# Patient Record
Sex: Male | Born: 1960 | Race: Black or African American | Hispanic: No | Marital: Single | Smoking: Former smoker
Health system: Southern US, Community
[De-identification: ages and names within clinical notes are randomized; demographics above are authoritative.]

## PROBLEM LIST (undated history)

## (undated) ENCOUNTER — Emergency Department: Payer: Self-pay

## (undated) DIAGNOSIS — I10 Essential (primary) hypertension: Secondary | ICD-10-CM

## (undated) DIAGNOSIS — F209 Schizophrenia, unspecified: Secondary | ICD-10-CM

## (undated) DIAGNOSIS — G4700 Insomnia, unspecified: Secondary | ICD-10-CM

## (undated) DIAGNOSIS — F419 Anxiety disorder, unspecified: Secondary | ICD-10-CM

## (undated) DIAGNOSIS — F319 Bipolar disorder, unspecified: Secondary | ICD-10-CM

## (undated) DIAGNOSIS — E785 Hyperlipidemia, unspecified: Secondary | ICD-10-CM

## (undated) DIAGNOSIS — G459 Transient cerebral ischemic attack, unspecified: Secondary | ICD-10-CM

## (undated) HISTORY — DX: Insomnia, unspecified: G47.00

## (undated) HISTORY — DX: Essential (primary) hypertension: I10

## (undated) HISTORY — DX: Anxiety disorder, unspecified: F41.9

## (undated) HISTORY — DX: Transient cerebral ischemic attack, unspecified: G45.9

## (undated) HISTORY — DX: Hyperlipidemia, unspecified: E78.5

## (undated) HISTORY — PX: NO PAST SURGERIES: SHX2092

---

## 2000-11-09 ENCOUNTER — Emergency Department (HOSPITAL_COMMUNITY): Admission: EM | Admit: 2000-11-09 | Discharge: 2000-11-09 | Payer: Self-pay | Admitting: *Deleted

## 2001-12-10 ENCOUNTER — Emergency Department (HOSPITAL_COMMUNITY): Admission: EM | Admit: 2001-12-10 | Discharge: 2001-12-10 | Payer: Self-pay | Admitting: Emergency Medicine

## 2002-03-13 ENCOUNTER — Emergency Department (HOSPITAL_COMMUNITY): Admission: EM | Admit: 2002-03-13 | Discharge: 2002-03-13 | Payer: Self-pay | Admitting: Emergency Medicine

## 2006-07-15 ENCOUNTER — Emergency Department (HOSPITAL_COMMUNITY): Admission: EM | Admit: 2006-07-15 | Discharge: 2006-07-15 | Payer: Self-pay | Admitting: Emergency Medicine

## 2006-07-22 ENCOUNTER — Ambulatory Visit: Payer: Self-pay | Admitting: Nurse Practitioner

## 2006-07-30 ENCOUNTER — Ambulatory Visit: Payer: Self-pay | Admitting: Nurse Practitioner

## 2006-10-25 ENCOUNTER — Emergency Department (HOSPITAL_COMMUNITY): Admission: EM | Admit: 2006-10-25 | Discharge: 2006-10-26 | Payer: Self-pay | Admitting: Emergency Medicine

## 2008-01-22 ENCOUNTER — Emergency Department (HOSPITAL_COMMUNITY): Admission: EM | Admit: 2008-01-22 | Discharge: 2008-01-22 | Payer: Self-pay | Admitting: Emergency Medicine

## 2014-02-14 ENCOUNTER — Emergency Department (HOSPITAL_COMMUNITY)
Admission: EM | Admit: 2014-02-14 | Discharge: 2014-02-14 | Disposition: A | Discharge status: Home / Self Care | Payer: Medicare Other | Attending: Emergency Medicine | Admitting: Emergency Medicine

## 2014-02-14 ENCOUNTER — Encounter (HOSPITAL_COMMUNITY): Payer: Self-pay | Admitting: Emergency Medicine

## 2014-02-14 DIAGNOSIS — F209 Schizophrenia, unspecified: Secondary | ICD-10-CM | POA: Insufficient documentation

## 2014-02-14 HISTORY — DX: Schizophrenia, unspecified: F20.9

## 2014-02-14 LAB — CBC
HCT: 36.3 % — ABNORMAL LOW (ref 39.0–52.0)
Hemoglobin: 11.7 g/dL — ABNORMAL LOW (ref 13.0–17.0)
MCH: 27 pg (ref 26.0–34.0)
MCHC: 32.2 g/dL (ref 30.0–36.0)
MCV: 83.8 fL (ref 78.0–100.0)
PLATELETS: 138 10*3/uL — AB (ref 150–400)
RBC: 4.33 MIL/uL (ref 4.22–5.81)
RDW: 13 % (ref 11.5–15.5)
WBC: 3.7 10*3/uL — AB (ref 4.0–10.5)

## 2014-02-14 LAB — COMPREHENSIVE METABOLIC PANEL
ALBUMIN: 3.6 g/dL (ref 3.5–5.2)
ALT: 29 U/L (ref 0–53)
AST: 51 U/L — AB (ref 0–37)
Alkaline Phosphatase: 64 U/L (ref 39–117)
Anion gap: 14 (ref 5–15)
BUN: 18 mg/dL (ref 6–23)
CALCIUM: 8.9 mg/dL (ref 8.4–10.5)
CO2: 25 mEq/L (ref 19–32)
CREATININE: 1.15 mg/dL (ref 0.50–1.35)
Chloride: 103 mEq/L (ref 96–112)
GFR calc Af Amer: 82 mL/min — ABNORMAL LOW (ref >90)
GFR calc non Af Amer: 70 mL/min — ABNORMAL LOW (ref >90)
Glucose, Bld: 110 mg/dL — ABNORMAL HIGH (ref 70–99)
Potassium: 3.4 mEq/L — ABNORMAL LOW (ref 3.7–5.3)
Sodium: 142 mEq/L (ref 137–147)
Total Bilirubin: 0.4 mg/dL (ref 0.3–1.2)
Total Protein: 6.6 g/dL (ref 6.0–8.3)

## 2014-02-14 LAB — ACETAMINOPHEN LEVEL: Acetaminophen (Tylenol), Serum: <15 ug/mL (ref 10–30)

## 2014-02-14 LAB — ETHANOL

## 2014-02-14 LAB — SALICYLATE LEVEL

## 2014-02-14 MED ORDER — ACETAMINOPHEN 325 MG PO TABS: 650.0000 mg | ORAL_TABLET | ORAL | Status: DC | PRN

## 2014-02-14 MED ORDER — IBUPROFEN 200 MG PO TABS: 400.0000 mg | ORAL_TABLET | Freq: Three times a day (TID) | ORAL | Status: DC | PRN

## 2014-02-14 MED ORDER — NICOTINE 21 MG/24HR TD PT24: 21.0000 mg | MEDICATED_PATCH | Freq: Every day | TRANSDERMAL | Status: DC | PRN

## 2014-02-14 MED ORDER — ALUM & MAG HYDROXIDE-SIMETH 200-200-20 MG/5ML PO SUSP: 30.0000 mL | ORAL | Status: DC | PRN

## 2014-02-14 MED ORDER — LORAZEPAM 1 MG PO TABS: 1.0000 mg | ORAL_TABLET | Freq: Three times a day (TID) | ORAL | Status: DC | PRN

## 2014-02-14 NOTE — ED Notes (Signed)
Patient is aware that a urine sample is needed. Patient will try again when able.

## 2014-02-14 NOTE — ED Provider Notes (Signed)
CSN: [id number]     Arrival date & time 02/14/14  1227 History   First MD Initiated Contact with Patient 02/14/14 1330     Chief Complaint  Patient presents with  . Not taking psych meds   . Weight Loss     The history is provided by the patient and a friend. The history is limited by the condition of the patient (psychosis).   Pt was seen at 1330.  Per pt's pastor and pt, c/o gradual onset and persistence of constant "schizophrenia" for the past 6+ months. Pt stopped taking his medications about that time because he felt that "he is healed." Pt very focused on religion, stating "when you cut down their faith you kill their faith," "the man was demonic and got me in debt." Pt denies SI, no HI.    Past Medical History  Diagnosis Date  . Schizophrenia    History reviewed. No pertinent past surgical history.  History  Substance Use Topics  . Smoking status: Never Smoker   . Smokeless tobacco: Not on file  . Alcohol Use: No    Review of Systems  Unable to perform ROS: Psychiatric disorder     Allergies  Review of patient's allergies indicates no known allergies.  Home Medications   Prior to Admission medications   Not on File   BP 129/47  Pulse 69  Temp(Src) 97.7 F (36.5 C) (Oral)  Resp 18  SpO2 100% Physical Exam 1335: Physical examination:  Nursing notes reviewed; Vital signs and O2 SAT reviewed;  Constitutional: Well developed, Well nourished, Well hydrated, In no acute distress; Head:  Normocephalic, atraumatic; Eyes: EOMI, PERRL, No scleral icterus; ENMT: Mouth and pharynx normal, Mucous membranes moist; Neck: Supple, Full range of motion, No lymphadenopathy; Cardiovascular: Regular rate and rhythm, No gallop; Respiratory: Breath sounds clear & equal bilaterally, No wheezes.  Speaking full sentences with ease, Normal respiratory effort/excursion; Chest: Nontender, Movement normal; Abdomen: Soft, Nontender, Nondistended, Normal bowel sounds;; Extremities: Pulses  normal, No tenderness, No edema, No calf edema or asymmetry.; Neuro: AA&Ox3, Major CN grossly intact.  Speech clear. No gross focal motor or sensory deficits in extremities. Climbs on and off stretcher easily by himself. Gait steady.; Skin: Color normal, Warm, Dry. Psych:  Pressured speech, tangential, rambling, focused on religion.    ED Course  Procedures     MDM  MDM Reviewed: previous chart, nursing note and vitals Reviewed previous: labs Interpretation: labs   Results for orders placed during the hospital encounter of 02/14/14  ACETAMINOPHEN LEVEL      Result Value Ref Range   Acetaminophen (Tylenol), Serum <15.0  10 - 30 ug/mL  CBC      Result Value Ref Range   WBC 3.7 (*) 4.0 - 10.5 K/uL   RBC 4.33  4.22 - 5.81 MIL/uL   Hemoglobin 11.7 (*) 13.0 - 17.0 g/dL   HCT 40.936.3 (*) 81.139.0 - 91.452.0 %   MCV 83.8  78.0 - 100.0 fL   MCH 27.0  26.0 - 34.0 pg   MCHC 32.2  30.0 - 36.0 g/dL   RDW 78.213.0  95.611.5 - 21.315.5 %   Platelets 138 (*) 150 - 400 K/uL  COMPREHENSIVE METABOLIC PANEL      Result Value Ref Range   Sodium 142  137 - 147 mEq/L   Potassium 3.4 (*) 3.7 - 5.3 mEq/L   Chloride 103  96 - 112 mEq/L   CO2 25  19 - 32 mEq/L   Glucose, Bld 110 (*)  70 - 99 mg/dL   BUN 18  6 - 23 mg/dL   Creatinine, Ser 1.61  0.50 - 1.35 mg/dL   Calcium 8.9  8.4 - 09.6 mg/dL   Total Protein 6.6  6.0 - 8.3 g/dL   Albumin 3.6  3.5 - 5.2 g/dL   AST 51 (*) 0 - 37 U/L   ALT 29  0 - 53 U/L   Alkaline Phosphatase 64  39 - 117 U/L   Total Bilirubin 0.4  0.3 - 1.2 mg/dL   GFR calc non Af Amer 70 (*) >90 mL/min   GFR calc Af Amer 82 (*) >90 mL/min   Anion gap 14  5 - 15  ETHANOL      Result Value Ref Range   Alcohol, Ethyl (B) <11  0 - 11 mg/dL  SALICYLATE LEVEL      Result Value Ref Range   Salicylate Lvl <2.0 (*) 2.8 - 20.0 mg/dL    0454:  TTS evaluation pending. Holding orders written.      Laray Anger, DO 02/14/14 1539

## 2014-02-14 NOTE — ED Notes (Addendum)
Patient belongings have been placed at the triage nurse's station

## 2014-02-14 NOTE — [facility] Suicide Risk Assessment (Signed)
Suicide Risk Assessment  Discharge Assessment     Demographic Factors:  Male and Living alone  Total Time spent with patient: 30 minutes  Psychiatric Specialty Exam:     Blood pressure 129/47, pulse 69, temperature 97.7 F (36.5 C), temperature source Oral, resp. rate 18, SpO2 100.00%.There is no height or weight on file to calculate BMI.  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  Patients presenting with no risk factors but with morbid ruminations; may be classified as minimal risk based on the severity of the depressive symptoms  Discharge Diagnoses:   AXIS I:  Schizoaffective Disorder AXIS II:  Deferred AXIS III:   Past Medical History  Diagnosis Date  . Schizophrenia    AXIS IV:  chronic mental illness AXIS V:  61-70 mild symptoms  Plan Of Care/Follow-up recommendations:  Activity:  resume usual  activity Diet:  resume usual diet  Is patient on multiple antipsychotic therapies at discharge:  No   Has Patient had three or more failed trials of antipsychotic monotherapy by history:  No  Recommended Plan for Multiple Antipsychotic Therapies: NA    Gasper Hopes D 02/14/2014, 3:20 PM

## 2014-02-14 NOTE — Consult Note (Signed)
  Review of Systems  Constitutional: Negative.   HENT: Negative.   Eyes: Negative.   Respiratory: Negative.   Cardiovascular: Negative.   Gastrointestinal: Negative.   Genitourinary: Negative.   Musculoskeletal: Negative.   Skin: Negative.   Psychiatric/Behavioral: Negative.

## 2014-02-14 NOTE — ED Notes (Addendum)
Pt was brought in my his pastor Jerry Carter [phone] or [phone]). [facility]. Pt hx of schizophrenia. Jerry Carter reports that pt says he is healed from schizophrenia, stopped taking his medicine 6 months ago and that sometimes pts fasts from food. Pt reports he stopped taking meds 6 months ago, has lost around 30 pounds in 3 months, and moved out of the place he was renting a few days ago because "the man was demonic and got him into deep debt". Pt is "speaking in faith that he will have a place to live at [facility]". Pt very religiously focused talking about "when you cut down their faith you kill their faith".  Pt denies SI/HI, denies AH/VH. Denies pain.

## 2014-02-14 NOTE — ED Notes (Signed)
Pt states that he is going through spiritual warfare.  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  When asked if he is hearing voices or seeing things.  States that the devil tells him "you see that fine foxy lady over there? You go get her!".  States that he also hears the voice of the holy spirit.  States that he has faith that God has healed him of his schizophrenia so he stopped taking his medications on Feb 3.  Tangential thoughts.

## 2014-02-14 NOTE — ED Notes (Signed)
D/C instructions reviewed with understanding stated. Has money for the bus and will be going to [facility]. Belongings bag x2 (very large bags) returned. Denies pain. No complaints voiced. Denies SI, HI and AVH. Escorted by mental health tech to front of hospital.

## 2014-02-14 NOTE — ED Notes (Signed)
Pt alert and oriented x4. Respirations even and unlabored, bilateral symmetrical rise and fall of chest. Skin warm and dry. In no acute distress. Denies needs.   

## 2014-02-14 NOTE — BH Assessment (Signed)
Tele Assessment Note   Jerry Carter is an 53 y.o. male with hx of schizophrenia. [facility] by his pastor Jerry Carter [phone] or [phone] ext [phone]). Reportedly, [facility]. Patient denies SI, HI, and AVH's. Patient is however apparently hyper religous with  tangential thoughts. His speech is pressures and he has flight of ideas. Patient mentions quotes bible versus, "keeping his faith", demonic spirits, and fasting. Patient has purposefully fasted for 3 months and has loss a total of 3 pounds in that time period.  Pt reports he stopped taking meds 6 months ago stating, "I was healed of schizophrenia". [facility] His last appointment with the psychiatrist was February 2015. Patient denies depressive symptoms and anxiety. He reports moving out of his place that he was sharing with a roommate as his only stressor. Patient sts "the man was demonic and got him into deep debt". Pt is "speaking in faith that he will have a place to live at [facility] Pt very religiously focused talking about various subjects. No alcohol or drug use reported.   Axis I: Schizophrenia Disorder  Axis II: Deferred Axis III:  Past Medical History  Diagnosis Date  . Schizophrenia    Axis IV: other psychosocial or environmental problems, problems related to social environment, problems with access to health care services and problems with primary support group Axis V: 51-60 moderate symptoms  Past Medical History:  Past Medical History  Diagnosis Date  . Schizophrenia     History reviewed. No pertinent past surgical history.  Family History: History reviewed. No pertinent family history.  Social History:  reports that he has never smoked. He does not have any smokeless tobacco history on file. He reports that he does not drink alcohol or use  illicit drugs.  Additional Social History:  Alcohol / Drug Use Pain Medications: SEE MAR Prescriptions: SEE MAR Over the Counter: SEE MAR History of alcohol / drug use?: No history of alcohol / drug abuse  CIWA: CIWA-Ar BP: 129/47 mmHg Pulse Rate: 69 COWS:     Allergies: No Known Allergies  Home Medications:  (Not in a hospital admission)  OB/GYN Status:  No LMP for male patient.  General Assessment Data Location of Assessment: [facility] Is this a Tele or Face-to-Face Assessment?: Face-to-Face Is this an Initial Assessment or a Re-assessment for this encounter?: Initial Assessment Living Arrangements: Other (Comment) (homeless; left home several days ago) Can pt return to current living arrangement?: Yes Admission Status: Voluntary Is patient capable of signing voluntary admission?: Yes Transfer from: Acute Hospital Referral Source: Other     [facility] Crisis Care Plan Living Arrangements: Other (Comment) (homeless; left home several days ago) Name of Psychiatrist:  Vesta Mixer([facility] (last seen 08/2012)) Name of Therapist:  (No therapist )  Education Status Is patient currently in school?: No  Risk to self with the past 6 months Suicidal Ideation: No Suicidal Intent: No Is patient at risk for suicide?: No Suicidal Plan?: No Access to Means: No What has been your use of drugs/alcohol within the last 12 months?:  (patient denies alcohol/drug use) Previous Attempts/Gestures: No How many times?:  (0) Other Self Harm Risks:  (n/a) Triggers for Past Attempts: Other (Comment) (No previous attempts/gestures ) Intentional Self Injurious Behavior: None Family Suicide History: Unknown Recent stressful life event(s):  ("keeping my religous faith") Persecutory voices/beliefs?: No Depression:  Yes Depression Symptoms: Fatigue Substance abuse history and/or treatment for substance abuse?: No Suicide prevention information given to non-admitted patients: Not applicable  Risk to Others within  the past 6 months Homicidal Ideation: No Thoughts of Harm to Others: No Current Homicidal Intent: No Current Homicidal Plan: No Access to Homicidal Means: No Identified Victim:  (n/a) History of harm to others?: No Assessment of Violence: None Noted Violent Behavior Description:  (patient is calm and cooperative ) Does patient have access to weapons?: No Criminal Charges Pending?: No Does patient have a court date: No  Psychosis Hallucinations: None noted Delusions: None noted  Mental Status Report Appear/Hygiene: Disheveled;Body odor Eye Contact: Good Motor Activity: Freedom of movement;Hyperactivity Speech: Pressured;Rapid Level of Consciousness: Alert Mood: Depressed;Preoccupied;Anxious Affect: Appropriate to circumstance Anxiety Level: None Thought Processes: Relevant;Circumstantial;Flight of Ideas Judgement: Unimpaired Orientation: Person;Place;Time;Situation Obsessive Compulsive Thoughts/Behaviors: None  Cognitive Functioning Concentration: Decreased Memory: Recent Intact;Remote Intact IQ: Average Insight: Fair Impulse Control: Fair Appetite: Fair Weight Loss:  (none reported ) Weight Gain:  (n/a) Sleep: Decreased Total Hours of Sleep:  (varies ) Vegetative Symptoms: None  ADLScreening [facility] Assessment Services) Patient's cognitive ability adequate to safely complete daily activities?: Yes Patient able to express need for assistance with ADLs?: No Independently performs ADLs?: Yes (appropriate for developmental age)  Prior Inpatient Therapy Prior Inpatient Therapy: Yes Prior Therapy Dates:  Research officer, political party (former [facility])) Prior Therapy Facilty/Provider(s):  [facility] ) Reason for Treatment:  (schizophrenia)  Prior Outpatient Therapy Prior Outpatient Therapy: Yes Prior Therapy Dates:  (current) Prior Therapy Facilty/Provider(s):  [facility] ) Reason for Treatment:  (med management)  ADL Screening (condition at time of admission) Patient's cognitive ability adequate  to safely complete daily activities?: Yes Is the patient deaf or have difficulty hearing?: No Does the patient have difficulty seeing, even when wearing glasses/contacts?: No Does the patient have difficulty concentrating, remembering, or making decisions?: Yes Patient able to express need for assistance with ADLs?: No Does the patient have difficulty dressing or bathing?: No Independently performs ADLs?: Yes (appropriate for developmental age) Does the patient have difficulty walking or climbing stairs?: No Weakness of Legs: None Weakness of Arms/Hands: None  Home Assistive Devices/Equipment Home Assistive Devices/Equipment: None    Abuse/Neglect Assessment (Assessment to be complete while patient is alone) Physical Abuse: Denies Verbal Abuse: Denies Sexual Abuse: Denies Exploitation of patient/patient's resources: Denies Self-Neglect: Denies Values / Beliefs Cultural Requests During Hospitalization: None Spiritual Requests During Hospitalization: None   Advance Directives (For Healthcare) Advance Directive: Patient does not have advance directive Nutrition Screen- [facility] Patient's home diet: Regular  Additional Information 1:1 In Past 12 Months?: No CIRT Risk: No Elopement Risk: No Does patient have medical clearance?: Yes     Disposition:  Disposition Initial Assessment Completed for this Encounter: Yes Disposition of Patient: Outpatient treatment;Other dispositions (Dr. Ladona Ridgel recommends discharge; F[facility]u with [facility] ) Type of outpatient treatment: Adult  Octaviano Batty 02/14/2014 3:16 PM

## 2014-02-14 NOTE — Consult Note (Signed)
[facility] Face-to-Face Psychiatry Consult   Reason for Consult:  History of schizophrenia and not taking medications Referring Physician:  ER MD  Jerry Carter is an 53 y.o. male. Total Time spent with patient: 45 minutes  Assessment: AXIS I:  Schizoaffective Disorder AXIS II:  Deferred AXIS III:   Past Medical History  Diagnosis Date  . Schizophrenia    AXIS IV:  chronic mental illness AXIS V:  61-70 mild symptoms  Plan:  No evidence of imminent risk to self or others at present.    Subjective:   Jerry Carter is a 53 y.o. male patient admitted with history of schizophrenia and not taking his medication.  HPI:  Mr Jerry Carter is preoccupied with religion but is not hallucinating or delusional or suicidal or threatening others.  The things he says are in the realm of reality, just exaggerated.  He believes he can manage himself without medication.  Part of his beliefs include fasting.  He has no desire to follow up with mental health. HPI Elements:   Location:  losing weight . Quality:  choosing to fast. Severity:  not debilitating. Timing:  choosing to fast. Duration:  several years. Context:  as above.  Past Psychiatric History: Past Medical History  Diagnosis Date  . Schizophrenia     reports that he has never smoked. He does not have any smokeless tobacco history on file. He reports that he does not drink alcohol or use illicit drugs. History reviewed. No pertinent family history.       Abuse/Neglect [facility]) Physical Abuse: Denies Verbal Abuse: Denies Sexual Abuse: Denies Allergies:  No Known Allergies  ACT Assessment Complete:  Yes:    Educational Status    Risk to Self: Risk to self with the past 6 months Is patient at risk for suicide?: No Substance abuse history and/or treatment for substance abuse?:  (waiting on urine specimen     BAL<11)  Risk to Others:    Abuse: Abuse/Neglect Assessment (Assessment to be complete while patient is alone) Physical Abuse:  Denies Verbal Abuse: Denies Sexual Abuse: Denies Exploitation of patient/patient's resources: Denies Self-Neglect: Denies  Prior Inpatient Therapy:    Prior Outpatient Therapy:    Additional Information:                    Objective: Blood pressure 129/47, pulse 69, temperature 97.7 F (36.5 C), temperature source Oral, resp. rate 18, SpO2 100.00%.There is no height or weight on file to calculate BMI. Results for orders placed during the hospital encounter of 02/14/14 (from the past 72 hour(s))  ACETAMINOPHEN LEVEL     Status: None   Collection Time    02/14/14  1:02 PM      Result Value Ref Range   Acetaminophen (Tylenol), Serum <15.0  10 - 30 ug/mL   Comment:            THERAPEUTIC CONCENTRATIONS VARY     SIGNIFICANTLY. A RANGE OF 10-30     ug/mL MAY BE AN EFFECTIVE     CONCENTRATION FOR MANY PATIENTS.     HOWEVER, SOME ARE BEST TREATED     AT CONCENTRATIONS OUTSIDE THIS     RANGE.     ACETAMINOPHEN CONCENTRATIONS     >150 ug/mL AT 4 HOURS AFTER     INGESTION AND >50 ug/mL AT 12     HOURS AFTER INGESTION ARE     OFTEN ASSOCIATED WITH TOXIC     REACTIONS.  CBC  Status: Abnormal   Collection Time    02/14/14  1:02 PM      Result Value Ref Range   WBC 3.7 (*) 4.0 - 10.5 K/uL   RBC 4.33  4.22 - 5.81 MIL/uL   Hemoglobin 11.7 (*) 13.0 - 17.0 g/dL   HCT 36.3 (*) 39.0 - 52.0 %   MCV 83.8  78.0 - 100.0 fL   MCH 27.0  26.0 - 34.0 pg   MCHC 32.2  30.0 - 36.0 g/dL   RDW 13.0  11.5 - 15.5 %   Platelets 138 (*) 150 - 400 K/uL  COMPREHENSIVE METABOLIC PANEL     Status: Abnormal   Collection Time    02/14/14  1:02 PM      Result Value Ref Range   Sodium 142  137 - 147 mEq/L   Potassium 3.4 (*) 3.7 - 5.3 mEq/L   Chloride 103  96 - 112 mEq/L   CO2 25  19 - 32 mEq/L   Glucose, Bld 110 (*) 70 - 99 mg/dL   BUN 18  6 - 23 mg/dL   Creatinine, Ser 1.15  0.50 - 1.35 mg/dL   Calcium 8.9  8.4 - 10.5 mg/dL   Total Protein 6.6  6.0 - 8.3 g/dL   Albumin 3.6  3.5 - 5.2  g/dL   AST 51 (*) 0 - 37 U/L   ALT 29  0 - 53 U/L   Alkaline Phosphatase 64  39 - 117 U/L   Total Bilirubin 0.4  0.3 - 1.2 mg/dL   GFR calc non Af Amer 70 (*) >90 mL/min   GFR calc Af Amer 82 (*) >90 mL/min   Comment: (NOTE)     The eGFR has been calculated using the CKD EPI equation.     This calculation has not been validated in all clinical situations.     eGFR's persistently <90 mL/min signify possible Chronic Kidney     Disease.   Anion gap 14  5 - 15  ETHANOL     Status: None   Collection Time    02/14/14  1:02 PM      Result Value Ref Range   Alcohol, Ethyl (B) <11  0 - 11 mg/dL   Comment:            LOWEST DETECTABLE LIMIT FOR     SERUM ALCOHOL IS 11 mg/dL     FOR MEDICAL PURPOSES ONLY  SALICYLATE LEVEL     Status: Abnormal   Collection Time    02/14/14  1:02 PM      Result Value Ref Range   Salicylate Lvl <1.6 (*) 2.8 - 20.0 mg/dL   Labs are reviewed and are pertinent for no psychiatric issues.  Current Facility-Administered Medications  Medication Dose Route Frequency Provider Last Rate Last Dose  . acetaminophen (TYLENOL) tablet 650 mg  650 mg Oral Q4H PRN Alfonzo Feller, DO      . alum & mag hydroxide-simeth (MAALOX/MYLANTA) 200-200-20 MG/5ML suspension 30 mL  30 mL Oral PRN Alfonzo Feller, DO      . ibuprofen (ADVIL,MOTRIN) tablet 400 mg  400 mg Oral Q8H PRN Alfonzo Feller, DO      . LORazepam (ATIVAN) tablet 1 mg  1 mg Oral Q8H PRN Alfonzo Feller, DO      . nicotine (NICODERM CQ - dosed in mg/24 hours) patch 21 mg  21 mg Transdermal Daily PRN Alfonzo Feller, DO  No current outpatient prescriptions on file.    Psychiatric Specialty Exam:     Blood pressure 129/47, pulse 69, temperature 97.7 F (36.5 C), temperature source Oral, resp. rate 18, SpO2 100.00%.There is no height or weight on file to calculate BMI.  General Appearance: Fairly Groomed  Engineer, water::  Good  Speech:  Clear and Coherent  Volume:  Normal  Mood:  Euthymic   Affect:  Appropriate  Thought Process:  Coherent  Orientation:  Full (Time, Place, and Person)  Thought Content:  Negative  Suicidal Thoughts:  No  Homicidal Thoughts:  No  Memory:  Immediate;   Good Recent;   Good Remote;   Good  Judgement:  Intact  Insight:  Fair  Psychomotor Activity:  Normal  Concentration:  Good  Recall:  Good  Fund of Knowledge:Fair  Language: Good  Akathisia:  Negative  Handed:  Right  AIMS (if indicated):     Assets:  Communication Skills Housing Social Support  Sleep:      Musculoskeletal: Strength & Muscle Tone: within normal limits Gait & Station: normal Patient leans: N/A  Treatment Plan Summary: not suicidal not homicidal and whatever psychosis he has is not endangering  Juneau Doughman D 02/14/2014 2:56 PM

## 2014-02-16 ENCOUNTER — Inpatient Hospital Stay (HOSPITAL_COMMUNITY)
Admission: AD | Admit: 2014-02-16 | Discharge: 2014-03-08 | DRG: 885 | Disposition: A | Discharge status: Home / Self Care | Payer: Medicare Other | Source: Intra-hospital | Attending: Psychiatry | Admitting: Psychiatry

## 2014-02-16 ENCOUNTER — Encounter (HOSPITAL_COMMUNITY): Payer: Self-pay

## 2014-02-16 ENCOUNTER — Emergency Department (EMERGENCY_DEPARTMENT_HOSPITAL)
Admission: EM | Admit: 2014-02-16 | Discharge: 2014-02-16 | Disposition: A | Discharge status: Home / Self Care | Payer: Medicare Other | Source: Home / Self Care

## 2014-02-16 ENCOUNTER — Encounter (HOSPITAL_COMMUNITY): Payer: Self-pay | Admitting: Emergency Medicine

## 2014-02-16 DIAGNOSIS — Z008 Encounter for other general examination: Secondary | ICD-10-CM

## 2014-02-16 DIAGNOSIS — K5900 Constipation, unspecified: Secondary | ICD-10-CM | POA: Diagnosis present

## 2014-02-16 DIAGNOSIS — G4700 Insomnia, unspecified: Secondary | ICD-10-CM | POA: Diagnosis present

## 2014-02-16 DIAGNOSIS — Z609 Problem related to social environment, unspecified: Secondary | ICD-10-CM | POA: Diagnosis not present

## 2014-02-16 DIAGNOSIS — F309 Manic episode, unspecified: Secondary | ICD-10-CM | POA: Diagnosis present

## 2014-02-16 DIAGNOSIS — Z9119 Patient's noncompliance with other medical treatment and regimen: Secondary | ICD-10-CM

## 2014-02-16 DIAGNOSIS — Z5900 Homelessness unspecified: Secondary | ICD-10-CM

## 2014-02-16 DIAGNOSIS — F250 Schizoaffective disorder, bipolar type: Secondary | ICD-10-CM | POA: Diagnosis present

## 2014-02-16 DIAGNOSIS — F411 Generalized anxiety disorder: Secondary | ICD-10-CM | POA: Diagnosis present

## 2014-02-16 DIAGNOSIS — Z91199 Patient's noncompliance with other medical treatment and regimen due to unspecified reason: Secondary | ICD-10-CM | POA: Diagnosis not present

## 2014-02-16 DIAGNOSIS — F259 Schizoaffective disorder, unspecified: Principal | ICD-10-CM | POA: Diagnosis present

## 2014-02-16 DIAGNOSIS — I10 Essential (primary) hypertension: Secondary | ICD-10-CM

## 2014-02-16 DIAGNOSIS — F39 Unspecified mood [affective] disorder: Secondary | ICD-10-CM | POA: Diagnosis present

## 2014-02-16 DIAGNOSIS — F29 Unspecified psychosis not due to a substance or known physiological condition: Secondary | ICD-10-CM

## 2014-02-16 HISTORY — DX: Essential (primary) hypertension: I10

## 2014-02-16 HISTORY — DX: Bipolar disorder, unspecified: F31.9

## 2014-02-16 LAB — CBC
HCT: 38.7 % — ABNORMAL LOW (ref 39.0–52.0)
HEMOGLOBIN: 12.9 g/dL — AB (ref 13.0–17.0)
MCH: 27.9 pg (ref 26.0–34.0)
MCHC: 33.3 g/dL (ref 30.0–36.0)
MCV: 83.6 fL (ref 78.0–100.0)
PLATELETS: 134 10*3/uL — AB (ref 150–400)
RBC: 4.63 MIL/uL (ref 4.22–5.81)
RDW: 13 % (ref 11.5–15.5)
WBC: 3.5 10*3/uL — ABNORMAL LOW (ref 4.0–10.5)

## 2014-02-16 LAB — COMPREHENSIVE METABOLIC PANEL
ALBUMIN: 3.8 g/dL (ref 3.5–5.2)
ALK PHOS: 49 U/L (ref 39–117)
ALT: 27 U/L (ref 0–53)
AST: 45 U/L — AB (ref 0–37)
Anion gap: 10 (ref 5–15)
BUN: 13 mg/dL (ref 6–23)
CO2: 28 mEq/L (ref 19–32)
Calcium: 9.1 mg/dL (ref 8.4–10.5)
Chloride: 103 mEq/L (ref 96–112)
Creatinine, Ser: 1.1 mg/dL (ref 0.50–1.35)
GFR calc Af Amer: 86 mL/min — ABNORMAL LOW (ref >90)
GFR calc non Af Amer: 74 mL/min — ABNORMAL LOW (ref >90)
Glucose, Bld: 84 mg/dL (ref 70–99)
POTASSIUM: 3.2 meq/L — AB (ref 3.7–5.3)
SODIUM: 141 meq/L (ref 137–147)
Total Bilirubin: 0.6 mg/dL (ref 0.3–1.2)
Total Protein: 6.9 g/dL (ref 6.0–8.3)

## 2014-02-16 LAB — RAPID URINE DRUG SCREEN, HOSP PERFORMED
AMPHETAMINES: NOT DETECTED
Barbiturates: NOT DETECTED
Benzodiazepines: NOT DETECTED
COCAINE: NOT DETECTED
OPIATES: NOT DETECTED
TETRAHYDROCANNABINOL: NOT DETECTED

## 2014-02-16 LAB — ETHANOL: Alcohol, Ethyl (B): <11 mg/dL (ref 0–11)

## 2014-02-16 LAB — ACETAMINOPHEN LEVEL: Acetaminophen (Tylenol), Serum: <15 ug/mL (ref 10–30)

## 2014-02-16 LAB — SALICYLATE LEVEL: Salicylate Lvl: <2 mg/dL — ABNORMAL LOW (ref 2.8–20.0)

## 2014-02-16 MED ORDER — ALUM & MAG HYDROXIDE-SIMETH 200-200-20 MG/5ML PO SUSP
30.0000 mL | ORAL | Status: DC | PRN
Start: 2014-02-16 — End: 2014-02-16

## 2014-02-16 MED ORDER — OLANZAPINE 10 MG PO TBDP
10.0000 mg | ORAL_TABLET | Freq: Three times a day (TID) | ORAL | Status: DC | PRN
  Administered 2014-02-17 – 2014-03-04 (×7): 10 mg via ORAL
  Filled 2014-02-16 (×7): qty 1

## 2014-02-16 MED ORDER — MAGNESIUM HYDROXIDE 400 MG/5ML PO SUSP: 30.0000 mL | Freq: Every day | ORAL | Status: DC | PRN

## 2014-02-16 MED ORDER — ACETAMINOPHEN 325 MG PO TABS: 650.0000 mg | ORAL_TABLET | Freq: Four times a day (QID) | ORAL | Status: DC | PRN

## 2014-02-16 MED ORDER — ALUM & MAG HYDROXIDE-SIMETH 200-200-20 MG/5ML PO SUSP: 30.0000 mL | ORAL | Status: DC | PRN

## 2014-02-16 MED ORDER — POTASSIUM CHLORIDE CRYS ER 10 MEQ PO TBCR
10.0000 meq | EXTENDED_RELEASE_TABLET | Freq: Every day | ORAL | Status: DC
  Administered 2014-02-17 – 2014-03-08 (×20): 10 meq via ORAL
  Filled 2014-02-16 (×22): qty 1

## 2014-02-16 MED ORDER — TRAZODONE HCL 100 MG PO TABS
100.0000 mg | ORAL_TABLET | Freq: Every evening | ORAL | Status: DC | PRN
  Filled 2014-02-16: qty 14
  Filled 2014-02-16: qty 1

## 2014-02-16 MED ORDER — POTASSIUM CHLORIDE CRYS ER 20 MEQ PO TBCR
40.0000 meq | EXTENDED_RELEASE_TABLET | Freq: Every day | ORAL | Status: DC
  Administered 2014-02-16: 40 meq via ORAL
  Filled 2014-02-16: qty 2

## 2014-02-16 MED ORDER — ONDANSETRON HCL 4 MG PO TABS: 4.0000 mg | ORAL_TABLET | Freq: Three times a day (TID) | ORAL | Status: DC | PRN

## 2014-02-16 MED ORDER — POTASSIUM CHLORIDE 20 MEQ/15ML (10%) PO LIQD
20.0000 meq | Freq: Once | ORAL | Status: AC
  Administered 2014-02-16: 20 meq via ORAL
  Filled 2014-02-16: qty 15

## 2014-02-16 MED ORDER — POTASSIUM CHLORIDE CRYS ER 20 MEQ PO TBCR
10.0000 meq | EXTENDED_RELEASE_TABLET | Freq: Every day | ORAL | Status: DC
Start: 2014-02-17 — End: 2014-02-16

## 2014-02-16 MED ORDER — HYDROCHLOROTHIAZIDE 25 MG PO TABS
25.0000 mg | ORAL_TABLET | Freq: Every day | ORAL | Status: DC
  Administered 2014-02-17 – 2014-03-08 (×20): 25 mg via ORAL
  Filled 2014-02-16: qty 1
  Filled 2014-02-16: qty 14
  Filled 2014-02-16 (×14): qty 1
  Filled 2014-02-16: qty 14
  Filled 2014-02-16 (×6): qty 1

## 2014-02-16 MED ORDER — LORAZEPAM 1 MG PO TABS
1.0000 mg | ORAL_TABLET | Freq: Four times a day (QID) | ORAL | Status: DC | PRN
  Administered 2014-02-16 – 2014-02-18 (×3): 1 mg via ORAL
  Filled 2014-02-16 (×3): qty 1

## 2014-02-16 MED ORDER — ENSURE COMPLETE PO LIQD
237.0000 mL | Freq: Three times a day (TID) | ORAL | Status: DC
  Administered 2014-02-16 – 2014-03-08 (×55): 237 mL via ORAL

## 2014-02-16 MED ORDER — POTASSIUM CHLORIDE CRYS ER 20 MEQ PO TBCR: 10.0000 meq | EXTENDED_RELEASE_TABLET | Freq: Every day | ORAL | Status: DC

## 2014-02-16 MED ORDER — ACETAMINOPHEN 325 MG PO TABS: 650.0000 mg | ORAL_TABLET | ORAL | Status: DC | PRN

## 2014-02-16 MED ORDER — HYDROCHLOROTHIAZIDE 12.5 MG PO CAPS
25.0000 mg | ORAL_CAPSULE | Freq: Every day | ORAL | Status: DC
  Administered 2014-02-16: 25 mg via ORAL
  Filled 2014-02-16: qty 2

## 2014-02-16 NOTE — ED Notes (Signed)
Patient wanded by security, belongings searched. 2 bags placed in TCU locker #[id number]

## 2014-02-16 NOTE — Progress Notes (Addendum)
NUTRITION ASSESSMENT  Pt identified as at risk on the Malnutrition Screen Tool  INTERVENTION: Ensure Complete TID  NUTRITION DIAGNOSIS: Unintentional weight loss related to sub-optimal intake as evidenced by pt report.   Goal: Pt to meet >/= 90% of their estimated nutrition needs.  Monitor:  PO intake  Assessment:  Admitted with schizophrenia. Pt reported in ED that he has been hearing God speak to him telling him not to take his medication because he is cured, and not to eat or drink. He reports he has lost approximately 50 pounds in the recent months. Per conversation with RN, pt has been having psychotic delusions of voices telling him to eat certain foods to gain weight and is currently very manic - not appropriate for RD visit. RN reports pt ate 100% of pizza for lunch today.    53 y.o. male  Height: Ht Readings from Last 1 Encounters:  02/16/14 5\' 8"  (1.727 m)    Weight: Wt Readings from Last 1 Encounters:  02/16/14 143 lb (64.864 kg)    Weight Hx: Wt Readings from Last 10 Encounters:  02/16/14 143 lb (64.864 kg)    BMI:  Body mass index is 21.75 kg/(m^2). Pt meets criteria for normal weight based on current BMI.  Estimated Nutritional Needs: Kcal: 25-30 kcal/kg Protein: > 1 gram protein/kg Fluid: 1 ml/kcal  Diet Order: Cardiac Pt is also offered choice of unit snacks mid-morning and mid-afternoon.  Pt is eating as desired.   Lab results and medications reviewed. AST elevated.   Charlott RakesHeather Brittainy Bucker MS, RD, LDN [phone] Pager [phone] Weekend/After Hours Pager

## 2014-02-16 NOTE — ED Notes (Signed)
TTS monitor at bedside for evaluation at present.
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
## 2014-02-16 NOTE — Tx Team (Signed)
Initial Interdisciplinary Treatment Plan   PATIENT STRESSORS: Financial stressors   PROBLEM LIST: Problem List/Patient Goals Date to be addressed Date deferred Reason deferred Estimated date of resolution  "feeling disoriented"      "hearing the holy spirit"                                                 DISCHARGE CRITERIA:  Ability to meet basic life and health needs Adequate post-discharge living arrangements Improved stabilization in mood, thinking, and/or behavior  PRELIMINARY DISCHARGE PLAN: Outpatient therapy Return to previous living arrangement  PATIENT/FAMIILY INVOLVEMENT: This treatment plan has been presented to and reviewed with the patient, Jerry Carter, and/or family member, .  The patient and family have been given the opportunity to ask questions and make suggestions.  Jerry Carter, Jerry Carter 02/16/2014, 3:54 PM

## 2014-02-16 NOTE — ED Notes (Signed)
Cardiac monitor parameters changed to 37-80. Will continue to monitor.
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
## 2014-02-16 NOTE — ED Notes (Signed)
MD at bedside. 

## 2014-02-16 NOTE — ED Notes (Signed)
Pt transferred from main ed, presents for medical clearance.  Pt is IVC.  Pt rambling not forthcoming with information, stating he is here for his physical health.  Denies SI, HI or AV hallucinations.  IVC papers report pt not taking medications and that Gods voice tells him not to eat or drink, with approximate 50 pound weight loss.  Church friends report pt not eating, sleeping or tending to personal hygiene.  Pt cooperative, anxious and confused at present.

## 2014-02-16 NOTE — ED Notes (Signed)
Per EMS, [facility] Patient was IVC by pastor for auditory hallucinations, refusing to take medications, not eating or drinking, and poor hygiene. Patient is cooperative, alert & oriented x4. Patient bradycardic when sleeping. Patient denies drug use to EMS.

## 2014-02-16 NOTE — ED Notes (Signed)
[facility] Expected date:  Expected time:  Means of arrival:  Comments: Rm 12

## 2014-02-16 NOTE — BH Assessment (Signed)
[facility] and Jamison Lord,NP. Patient is assigned to bed 402-1. Pt is IVC'd. Patient's ED nurse has been [facility].   Glorious PeachNajah Jobany Montellano, MS, LCASA Assessment Counselor

## 2014-02-16 NOTE — BH Assessment (Signed)
TA complete. Informed RN so TA equipment could be removed as pt was trying to use it to call his nurse. Informed nurse he is requesting a glass of juice.   Relayed results of assessment to Alberteen SamFran Hobson, NP. She will review IVC paperwork and call this writer back with disposition recommendations.   Clista BernhardtNancy Elanore Talcott, [facility] Triage Specialist 02/16/2014 4:53 AM
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
## 2014-02-16 NOTE — Care Management Utilization Note (Signed)
Per State Regulation 482.30  The chart was reviewed for necessity with respect to the patient's Admission/ Duration of stay. 02/16/14  Next Review Date: 02/19/14  Lacinda AxonAlice Teauna Dubach, RN, BSN

## 2014-02-16 NOTE — ED Notes (Signed)
Patient belongings removed from locker #27, [facility].

## 2014-02-16 NOTE — ED Notes (Signed)
Patient states he is homeless. 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 Patient denies SI/HI. Denies drugs or alcohol. Patient states he has not been taking his home medications as he "thought he did not need them". Patient is alert and oriented at this time, denies hallucinations.
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
## 2014-02-16 NOTE — ED Notes (Signed)
EDP Molpus requested for patient to be on cardiac monitoring. Dr. Read DriversMolpus informed that Pt was on the monitor, on a 12-lead.

## 2014-02-17 ENCOUNTER — Encounter (HOSPITAL_COMMUNITY): Payer: Self-pay | Admitting: Psychiatry

## 2014-02-17 DIAGNOSIS — F29 Unspecified psychosis not due to a substance or known physiological condition: Secondary | ICD-10-CM

## 2014-02-17 DIAGNOSIS — F209 Schizophrenia, unspecified: Secondary | ICD-10-CM

## 2014-02-17 MED ORDER — ARIPIPRAZOLE 5 MG PO TABS
5.0000 mg | ORAL_TABLET | Freq: Two times a day (BID) | ORAL | Status: DC
  Administered 2014-02-17 – 2014-02-21 (×9): 5 mg via ORAL
  Filled 2014-02-17: qty 1
  Filled 2014-02-17: qty 28
  Filled 2014-02-17: qty 1
  Filled 2014-02-17: qty 28
  Filled 2014-02-17 (×2): qty 1
  Filled 2014-02-17: qty 28
  Filled 2014-02-17 (×2): qty 1
  Filled 2014-02-17: qty 28
  Filled 2014-02-17 (×4): qty 1

## 2014-02-17 MED ORDER — DIVALPROEX SODIUM 500 MG PO DR TAB
500.0000 mg | DELAYED_RELEASE_TABLET | Freq: Two times a day (BID) | ORAL | Status: DC
  Administered 2014-02-17 – 2014-02-19 (×4): 500 mg via ORAL
  Filled 2014-02-17 (×6): qty 1

## 2014-02-17 NOTE — Progress Notes (Signed)
Patient ID: Jerry Carter, male   DOB: 1961-05-19, 53 y.o.   MRN: [id number] Psychoeducational Group Note  Date:  02/17/2014 Time:0900am  Group Topic/Focus:  Identifying Needs:   The focus of this group is to help patients identify their personal needs that have been historically problematic and identify healthy behaviors to address their needs.  Participation Level:  Active  Participation Quality:  Redirectable  Affect:  Excited  Cognitive:  Disorganized and Delusional  Insight:  Resistant  Engagement in Group:  Resistant  Additional Comments:  Inventory group   Jerry Carter, Jerry Carter 02/17/2014,1:20 PM
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
## 2014-02-17 NOTE — Progress Notes (Signed)
Patient ID: Jerry Carter, male   DOB: 25-Jan-1961, 53 y.o.   MRN: [id number] D)  Has been out and about this evening, was in his room when his roommate was putting on his CPAP machine, pt began laughing and got back out of bed and went back down the hall.  Was also seen laughing when he was alone, and appeared to be responding to internal stimuli.  Went into the bathroom and was reading after roommate went to bed, refused sleep med, refused laxative, and refused to go back to bed.  Eventually was flushing the toilet and said his bowels had moved, but has continued to stay in the bathroom reading.   A)  Encouraging to go to bed and get his rest, continue to monitor for safety, continue POC R)  Safety maintained.

## 2014-02-17 NOTE — Progress Notes (Signed)
The focus of this group is to help patients review their daily goal of treatment and discuss progress on daily workbooks. Pt attended the evening group session but had difficulty responding on-topic to discussion prompts from the Writer. Pt was religiously pre-occupied and his every response came back to this. Pt told the group how he had been chosen by God to preach his message. Pt's speech was at times garbled and difficult to understand. Pt's only additional requests for the evening was for pencil and paper, which were given to him following group. Pt's affect was excited and fidgety.

## 2014-02-17 NOTE — Progress Notes (Signed)
Patient ID: Jerry Carter, male   DOB: Nov 21, 1960, 53 y.o.   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  Has been bright, talkative, seen encouraging peers to be more creative.  Has been singing to himself and in his room,  Smiled and was appreciative when told he has a nice voice.  Has been internally focused at times, tangential, but pleasant, hears God's voice sometimes, which he states inspires him to write.  Was offered zyprexa but refused. A)  Will continue to monitor for safety, continue POC R)  Safety maintained.

## 2014-02-17 NOTE — [facility] Group Notes (Signed)
[facility] Group Notes: (Clinical Social Work)   02/17/2014      Type of Therapy:  Group Therapy   Participation Level:  Did Not Attend    Jerry MantleMareida Grossman-Orr, LCSW 02/17/2014, 4:40 PM

## 2014-02-17 NOTE — Progress Notes (Signed)
Patient ID: Jerry Carter, male   DOB: 24-Dec-1960, 53 y.o.   MRN: [id number] D. Patient presents with euphoric mood, affect congruent. Fayrene FearingJames is very religiously preoccupied, with pressured, tangential speech. He states '' oh praise the lord, it is a new day for cleansing and praise jesus and god, I am going to be fasting, oh yes! '' patient noted to be very restless and dancing in the halls at times. A. Medications given prn for anxiety/agitation. R. Patient remains hypomanic in mood, but cooperative. He denies any further acute concerns at this time. Will continue to monitor q 15 minutes for safety.

## 2014-02-17 NOTE — Progress Notes (Signed)
Patient ID: Ronelle NighJames L Spindel, male   DOB: 10-28-1960, 53 y.o.   MRN: [id number] Psychoeducational Group Note  Date:  02/17/2014 Time:0920am  Group Topic/Focus:  Identifying Needs:   The focus of this group is to help patients identify their personal needs that have been historically problematic and identify healthy behaviors to address their needs.  Participation Level:  Active  Participation Quality:  Redirectable  Affect:  Excited  Cognitive:  Disorganized and Delusional  Insight:  Resistant  Engagement in Group:  Resistant  Additional Comments:  Healthy coping skills.   Valente DavidWeaver, Yony Roulston Brooks 02/17/2014,1:21 PM
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
## 2014-02-18 DIAGNOSIS — F259 Schizoaffective disorder, unspecified: Principal | ICD-10-CM

## 2014-02-18 NOTE — Progress Notes (Signed)
[facility] MD Progress Note  02/18/2014 2:48 PM Jerry Carter  MRN:  [id number] Subjective: Patient states:  '' I 've got my religious rap, and praise the day it's a new day and we will lead over tribulations! ''   Objective: Patient was seen and his chart was reviewed. Patient remains euphoric mood, grandiose, delusional, very religiously preoccupied, with pressured, tangential speech. His thought process is bizarre and  Disorganized. Patient is fixated on being "healed by reading my bible" and reaching out to his pastor but he has been accepting his medications. Patient reports racing thoughts and decreased need for sleep. He has been participating in the group unit but needs to be redirected.   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 Hypertension    Axis IV: other psychosocial or environmental problems and problems related to social environment  ADL's:  Intact  Sleep: Fair  Appetite:  Fair  Suicidal Ideation:  Plan:  denies Intent:  denies Means:  denies Homicidal Ideation:  Denies  AEB (as evidenced by):  Psychiatric Specialty Exam: Physical Exam  Psychiatric: His affect is labile. His speech is rapid and/or pressured. He is hyperactive and actively hallucinating. Thought content is delusional. Cognition and memory are normal. He expresses impulsivity.    Review of Systems  Constitutional: Negative.   HENT: Negative.   Eyes: Negative.   Respiratory: Negative.   Cardiovascular: Negative.   Gastrointestinal: Negative.   Genitourinary: Negative.   Musculoskeletal: Negative.   Skin: Negative.    Neurological: Negative.   Endo/Heme/Allergies: Negative.   Psychiatric/Behavioral: The patient is nervous/anxious and has insomnia.     Blood pressure 135/112, pulse 80, temperature 97.4 F (36.3 C), temperature source Oral, resp. rate 18, height 5\' 8"  (1.727 m), weight 64.864 kg (143 lb).Body mass index is 21.75 kg/(m^2).  General Appearance: Disheveled  Eye Contact::  Minimal  Speech:  Pressured  Volume:  Increased  Mood:  Irritable  Affect:  Labile and Full Range  Thought Process:  Disorganized  Orientation:  Full (Time, Place, and Person)  Thought Content:  Delusions  Suicidal Thoughts:  No  Homicidal Thoughts:  No  Memory:  Immediate;   Fair Recent;   Fair Remote;   Fair  Judgement:  Impaired  Insight:  Lacking  Psychomotor Activity:  Increased  Concentration:  Fair  Recall:  [employer] of Knowledge:Fair  Language: Good  Akathisia:  No  Handed:  Right  AIMS (if indicated):     Assets:  Communication Skills Desire for Improvement Physical Health  Sleep:  Number of Hours: 1.75   Musculoskeletal: Strength & Muscle Tone: within normal limits Gait & Station: normal Patient leans: N/A  Current Medications: Current Facility-Administered Medications  Medication Dose Route Frequency Provider Last Rate Last Dose  . acetaminophen (TYLENOL) tablet 650 mg  650 mg Oral Q4H PRN Nanine Means, NP      . alum & mag hydroxide-simeth (MAALOX/MYLANTA) 200-200-20 MG/5ML suspension 30 mL  30 mL Oral PRN Nanine Means, NP      . ARIPiprazole (ABILIFY) tablet 5 mg  5 mg Oral BID PC Lynsay Fesperman   5 mg at 02/18/14 0759  . divalproex (DEPAKOTE) DR tablet 500 mg  500 mg Oral BID PC Alexi Dorminey   500 mg at 02/18/14 0759  . feeding supplement (ENSURE COMPLETE) (ENSURE COMPLETE) liquid 237 mL  237 mL Oral TID BM Tenny CrawHeather S Winkler, RD   237 mL at 02/18/14 1437  . hydrochlorothiazide (HYDRODIURIL) tablet 25 mg  25 mg Oral Daily Nanine MeansJamison Lord, NP   25 mg at 02/18/14 0759  . LORazepam  (ATIVAN) tablet 1 mg  1 mg Oral Q6H PRN Fransisca KaufmannLaura Davis, NP   1 mg at 02/18/14 0759  . magnesium hydroxide (MILK OF MAGNESIA) suspension 30 mL  30 mL Oral Daily PRN Nanine MeansJamison Lord, NP      . OLANZapine zydis (ZYPREXA) disintegrating tablet 10 mg  10 mg Oral Q8H PRN Stafford Riviera   10 mg at 02/18/14 0800  . ondansetron (ZOFRAN) tablet 4 mg  4 mg Oral Q8H PRN Nanine MeansJamison Lord, NP      . potassium chloride (K-DUR,KLOR-CON) CR tablet 10 mEq  10 mEq Oral Daily Nanine MeansJamison Lord, NP   10 mEq at 02/18/14 0800  . traZODone (DESYREL) tablet 100 mg  100 mg Oral QHS PRN Fransisca KaufmannLaura Davis, NP        Lab Results: No results found for this or any previous visit (from the past 48 hour(s)).  Physical Findings: AIMS: Facial and Oral Movements Muscles of Facial Expression: None, normal Lips and Perioral Area: None, normal Jaw: None, normal Tongue: None, normal,Extremity Movements Upper (arms, wrists, hands, fingers): None, normal Lower (legs, knees, ankles, toes): None, normal, Trunk Movements Neck, shoulders, hips: None, normal, Overall Severity Severity of abnormal movements (highest score from questions above): None, normal Incapacitation due to abnormal movements: None, normal Patient's awareness of abnormal movements (rate only patient's report): No Awareness, Dental Status Current problems with teeth and/or dentures?: No Does patient usually wear dentures?: No  CIWA:    COWS:     Treatment Plan Summary: Daily contact with patient to assess and evaluate symptoms and progress in treatment Medication management  Plan:1. Admit for crisis management and stabilization. 2. Medication management to reduce current symptoms to base line and improve the  patient's overall level of functioning: -Continue Abilify 5mg  po bid for delusions -Continue Depakote ER 500mg  po bid for mood stabilization -Continue Trazodone 100mg  po qhs as needed for insomnia 3. Treat health problems as indicated. 4. Develop treatment plan to  decrease risk of relapse upon discharge and the need for  readmission. 5. Psycho-social education regarding relapse prevention and self care. 6. Health care follow up as needed for medical problems. 7. Restart home medications where appropriate.   Medical Decision Making Problem Points:  Established problem, worsening (2), Review of last therapy session (1) and Review of psycho-social stressors (1) Data Points:  Order Aims Assessment (2) Review or order clinical lab tests (1) Review of medication regiment & side effects (2) Review of new medications or change in dosage (2)  I certify that inpatient services furnished can reasonably be expected to improve the patient's condition.   Thedore MinsAkintayo, Daily Doe, MD 02/18/2014, 2:48 PM

## 2014-02-18 NOTE — Progress Notes (Signed)
Patient ID: Jerry Carter, male   DOB: Apr 23, 1961, 53 y.o.   MRN: [id number] Psychoeducational Group Note  Date:  02/18/2014 Time:  0900am  Group Topic/Focus:  Making Healthy Choices:   The focus of this group is to help patients identify negative/unhealthy choices they were using prior to admission and identify positive/healthier coping strategies to replace them upon discharge.  Participation Level:  Active  Participation Quality:  Appropriate  Affect:  Appropriate  Cognitive:  Appropriate  Insight:  Supportive  Engagement in Group:  Supportive  Additional Comments:  Inventory group   Valente DavidWeaver, Jerry Carter 02/18/2014,10:37 AM
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
## 2014-02-18 NOTE — Progress Notes (Signed)
Patient ID: Jerry Carter, male   DOB: [birth year], 53 y.o.   MRN: [id number] Psychoeducational Group Note  Date:  02/18/2014 Time:  0920am  Group Topic/Focus:  Making Healthy Choices:   The focus of this group is to help patients identify negative/unhealthy choices they were using prior to admission and identify positive/healthier coping strategies to replace them upon discharge.  Participation Level:  Active  Participation Quality:  Appropriate  Affect:  Appropriate  Cognitive:  Appropriate  Insight:  Supportive  Engagement in Group:  Supportive  Additional Comments:  Healthy support systems.   Jerry Carter, Jerry Carter 02/18/2014,10:38 AM

## 2014-02-18 NOTE — Progress Notes (Signed)
Patient ID: Jerry Carter, male   DOB: 04-Sep-1960, 53 y.o.   MRN: [id number] D. Patient presents with euphoric mood, affect congruent, again today. Jerry Carter remains very religiously preoccupied, with pressured, tangential speech. He is very disorganized, writing on various papers '' i've got my religious rap, and praise the day it's a new day and we will lead over tribulations! ''  A. Medications given prn for anxiety/agitation. R. Patient remains hypomanic in mood, but cooperative. He denies any further acute concerns at this time. Will continue to monitor q 15 minutes for safety.

## 2014-02-18 NOTE — [facility] Group Notes (Signed)
[facility] Group Notes:  (Clinical Social Work)  02/18/2014   11:15am-12:00pm  Summary of Progress/Problems:  The main focus of today's process group was to listen to a variety of genres of music and to identify that different types of music provoke different responses.  The patient then was able to identify personally what was soothing for them, as well as energizing.  Handouts were used to record feelings evoked, as well as how patient can personally use this knowledge in sleep habits, with depression, and with other symptoms.  The patient expressed understanding of concepts, as well as knowledge of how each type of music affected him/her and how this can be used at home as a wellness/recovery tool. He did not talk much, but did respond when called on.  Type of Therapy:  Music Therapy   Participation Level:  Active  Participation Quality:  Attentive and Sharing  Affect:  Blunted  Cognitive:  Oriented  Insight:  Engaged  Engagement in Therapy:  Engaged  Modes of Intervention:   Activity, Exploration  Ambrose MantleMareida Grossman-Orr, LCSW 02/18/2014, 12:30pm

## 2014-02-18 NOTE — Progress Notes (Signed)
Patient ID: Jerry Carter, male   DOB: 18-Apr-1961, 53 y.o.   MRN: [id number] D)  Has been out and about on the hall this evening, came up to the med window, speech is pressured, difficult to understand at times, stated his son had found an apt for him near his church, was going to pray about it.  Has been writing more songs, singing to himself at times, pleasant, internally focused.  Denies thoughts of harming self or others. A)  Will continue to monitor q 15 minutes for safety, continue POC R)  Safety maintained.

## 2014-02-19 MED ORDER — DIVALPROEX SODIUM 500 MG PO DR TAB
500.0000 mg | DELAYED_RELEASE_TABLET | Freq: Three times a day (TID) | ORAL | Status: DC
  Administered 2014-02-19 – 2014-02-26 (×21): 500 mg via ORAL
  Filled 2014-02-19: qty 42
  Filled 2014-02-19 (×2): qty 1
  Filled 2014-02-19 (×5): qty 42
  Filled 2014-02-19: qty 1
  Filled 2014-02-19: qty 42
  Filled 2014-02-19: qty 1
  Filled 2014-02-19: qty 42
  Filled 2014-02-19: qty 1
  Filled 2014-02-19: qty 42
  Filled 2014-02-19 (×2): qty 1
  Filled 2014-02-19 (×2): qty 42
  Filled 2014-02-19: qty 1
  Filled 2014-02-19 (×4): qty 42
  Filled 2014-02-19: qty 1
  Filled 2014-02-19 (×3): qty 42

## 2014-02-19 NOTE — Progress Notes (Signed)
D   Pt was in bed early in the shift  But was awake and pleasant   He said his day was ok and he had no further needs  He did not require anything to help him sleep   He tends to isolate and has minimal interaction with others  A   Verbal support given   Medications offered and educated on same   Q 15 min checks R   Pt safe at present

## 2014-02-19 NOTE — Progress Notes (Addendum)
[facility] MD Progress Note  02/19/2014 10:36 AM Jerry Carter  MRN:  [id number] Subjective:  '' I am possessed by a spiritual demons, I'm cured by God''   Objective:  Patient seen and chart was reviewed. Patient remains grandiose, delusional and religiously preoccupied.  His thought process is very tangential.  He is easily irritable with pressured speech.  He has minimal insight into his psychiatric illness.  However he is taking his medication .  He endorsed poor sleep and racing thoughts.  He is fixated on healing by the Bible.  He admitted he stopped taking his medication because he felt he needed.  He also admitted that he was given psychotropic medication since age 53.  Patient remains very disorganized, intrusive and disruptive.  However there were no aggressive or violent behavior.  He goes to the group but he has seen irritable and needs redirection.  Patient denies any suicidal thoughts or homicidal thought.  No side effects noted.  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 Hypertension    Axis IV: other psychosocial or environmental problems and problems related to social environment  ADL's:  Intact  Sleep: Fair  Appetite:  Fair  Suicidal Ideation:  Plan:  denies Intent:  denies Means:  denies Homicidal Ideation:  Denies  AEB (as evidenced by):  Psychiatric Specialty Exam: Physical Exam  Psychiatric: His affect is labile. His speech is rapid and/or pressured. He is hyperactive and actively hallucinating. Thought content is delusional. Cognition and memory are normal. He expresses impulsivity.     Review of Systems  Constitutional: Negative.   HENT: Negative.   Eyes: Negative.   Respiratory: Negative.   Cardiovascular: Negative.   Gastrointestinal: Negative.   Genitourinary: Negative.   Musculoskeletal: Negative.   Skin: Negative.   Neurological: Negative.   Endo/Heme/Allergies: Negative.   Psychiatric/Behavioral: The patient is nervous/anxious and has insomnia.     Blood pressure 142/101, pulse 94, temperature 97.7 F (36.5 C), temperature source Oral, resp. rate 20, height 5\' 8"  (1.727 m), weight 143 lb (64.864 kg).Body mass index is 21.75 kg/(m^2).  General Appearance: Disheveled  Eye Contact::  Minimal  Speech:  Pressured  Volume:  Increased  Mood:  Irritable  Affect:  Labile and Full Range  Thought Process:  Disorganized  Orientation:  Full (Time, Place, and Person)  Thought Content:  Delusions  Suicidal Thoughts:  No  Homicidal Thoughts:  No  Memory:  Immediate;   Fair Recent;   Fair Remote;   Fair  Judgement:  Impaired  Insight:  Lacking  Psychomotor Activity:  Increased  Concentration:  Fair  Recall:  [employer] of Knowledge:Fair  Language: Good  Akathisia:  No  Handed:  Right  AIMS (if indicated):     Assets:  Communication Skills Desire for Improvement Physical Health  Sleep:  Number of Hours: 2   Musculoskeletal: Strength & Muscle Tone: within normal limits Gait & Station: normal Patient leans: N/A  Current Medications: Current Facility-Administered Medications  Medication Dose Route Frequency Provider Last Rate Last Dose  . acetaminophen (TYLENOL) tablet 650 mg  650 mg Oral Q4H PRN Nanine Means, NP      . alum & mag hydroxide-simeth (MAALOX/MYLANTA)  200-200-20 MG/5ML suspension 30 mL  30 mL Oral PRN Nanine MeansJamison Lord, NP      . ARIPiprazole (ABILIFY) tablet 5 mg  5 mg Oral BID PC Mojeed Akintayo   5 mg at 02/19/14 0802  . divalproex (DEPAKOTE) DR tablet 500 mg  500 mg Oral BID PC Mojeed Akintayo   500 mg at 02/19/14 0802  . feeding supplement  (ENSURE COMPLETE) (ENSURE COMPLETE) liquid 237 mL  237 mL Oral TID BM Tenny CrawHeather S Winkler, RD   237 mL at 02/18/14 1959  . hydrochlorothiazide (HYDRODIURIL) tablet 25 mg  25 mg Oral Daily Nanine MeansJamison Lord, NP   25 mg at 02/19/14 0802  . LORazepam (ATIVAN) tablet 1 mg  1 mg Oral Q6H PRN Fransisca KaufmannLaura Davis, NP   1 mg at 02/18/14 0759  . magnesium hydroxide (MILK OF MAGNESIA) suspension 30 mL  30 mL Oral Daily PRN Nanine MeansJamison Lord, NP      . OLANZapine zydis (ZYPREXA) disintegrating tablet 10 mg  10 mg Oral Q8H PRN Mojeed Akintayo   10 mg at 02/18/14 0800  . ondansetron (ZOFRAN) tablet 4 mg  4 mg Oral Q8H PRN Nanine MeansJamison Lord, NP      . potassium chloride (K-DUR,KLOR-CON) CR tablet 10 mEq  10 mEq Oral Daily Nanine MeansJamison Lord, NP   10 mEq at 02/19/14 0802  . traZODone (DESYREL) tablet 100 mg  100 mg Oral QHS PRN Fransisca KaufmannLaura Davis, NP        Lab Results: No results found for this or any previous visit (from the past 48 hour(s)).  Physical Findings: AIMS: Facial and Oral Movements Muscles of Facial Expression: None, normal Lips and Perioral Area: None, normal Jaw: None, normal Tongue: None, normal,Extremity Movements Upper (arms, wrists, hands, fingers): None, normal Lower (legs, knees, ankles, toes): None, normal, Trunk Movements Neck, shoulders, hips: None, normal, Overall Severity Severity of abnormal movements (highest score from questions above): None, normal Incapacitation due to abnormal movements: None, normal Patient's awareness of abnormal movements (rate only patient's report): No Awareness, Dental Status Current problems with teeth and/or dentures?: No Does patient usually wear dentures?: No  CIWA:    COWS:     Treatment Plan Summary: Daily contact with patient to assess and evaluate symptoms and progress in treatment Medication management  Plan:1. Admit for crisis management and stabilization. 2. Medication management to reduce current symptoms to base line and improve the  patient's overall level of  functioning: -Continue Abilify 5mg  po bid for delusions -Increase Depakote ER 500mg  po Tid for mood stabilization -Continue Trazodone 100mg  po qhs as needed for insomnia 3. Treat health problems as indicated. 4. Develop treatment plan to decrease risk of relapse upon discharge and the need for  readmission. 5. Psycho-social education regarding relapse prevention and self care. 6. Health care follow up as needed for medical problems. 7. Restart home medications where appropriate.   Medical Decision Making Problem Points:  Established problem, worsening (2), Review of last therapy session (1) and Review of psycho-social stressors (1) Data Points:  Order Aims Assessment (2) Review or order clinical lab tests (1) Review of medication regiment & side effects (2) Review of new medications or change in dosage (2)  I certify that inpatient services furnished can reasonably be expected to improve the patient's condition.   ARFEEN,SYED T., MD 02/19/2014, 10:36 AM

## 2014-02-19 NOTE — Tx Team (Signed)
  Interdisciplinary Treatment Plan Update   Date Reviewed:  02/19/2014  Time Reviewed:  8:22 AM  Progress in Treatment:   Attending groups: Yes Participating in groups: No Taking medication as prescribed: Yes  Tolerating medication: Yes Family/Significant other contact made:No  Patient understands diagnosis: No  Limited insight Discussing patient identified problems/goals with staff: Yes See initial care plan Medical problems stabilized or resolved: Yes Denies suicidal/homicidal ideation: Yes  In tx team Patient has not harmed self or others: Yes  For review of initial/current patient goals, please see plan of care.  Estimated Length of Stay:  4-5 days  Reason for Continuation of Hospitalization: Delusions  Mania  New Problems/Goals identified:  N/A  Discharge Plan or Barriers:   return home, follow up outpt  Additional Comments:  AA male, 53 years old was admitted from the ER yesterday for Psychotic symptoms and not taking his medications. Patient was brought in by his pastor who felt patient needed to be on his medications after threatening Church members. Patient today, stated he does not need medications but the voice of God is healing him. Patient was very tangential, perseverating on religion and quoting the Bible. Patient states he stopped taking his medications because "God has healed him of his Schizophrenia" Patient reports he is homeless because he was living with "sinners committing crimes" so he left the house. Patient reported poor sleep and poor appetite and stated that "if you believe in God, you do not need too much food"  [facility] by [facility] pastor [name] [phone] or [phone]).    Attendees:  Signature: Thedore MinsMojeed Akintayo, MD 02/19/2014 8:22 AM   Signature: Richelle Itood Tammie Ellsworth, LCSW 02/19/2014 8:22 AM  Signature: Fransisca KaufmannLaura Davis, NP 02/19/2014 8:22 AM  Signature: Joslyn Devonaroline Beaudry, RN 02/19/2014 8:22 AM  Signature: Liborio NixonPatrice White, RN 02/19/2014  8:22 AM  Signature:  02/19/2014 8:22 AM  Signature:   02/19/2014 8:22 AM  Signature:    Signature:    Signature:    Signature:    Signature:    Signature:      Scribe for Treatment Team:   Richelle Itood Liyah Higham, LCSW  02/19/2014 8:22 AM

## 2014-02-19 NOTE — [facility] Group Notes (Signed)
[facility]   02/19/2014 11:03 AM  Participation Quality:  None  Thayer OhmChris waited patiently as long as he could after I shut him down initially.  When he could no longer tolerate it, he butted in and inquired if this group was mandatory.  I explained that it is, but also told him he could choose to not answer questions  In the group, and could request to speak with me individually instead.  He told me he preferred that, and left.    Daryel GeraldNorth, Rylan Bernard B

## 2014-02-19 NOTE — Progress Notes (Signed)
Pt presents with rapid pressured speech, disorganized thoughts and is religiously preoccupied. Pt fidgety, impulsive, and is constantly talking about God or religion. Pt reported that he hear God's voice. Pt denies VH. Pt denies SI/HI. Pt compliant with taking meds and attending groups.  Medications administered as ordered per MD. Verbal support given. Pt encouraged to attend groups. 15 minute checks performed for safety.  Pt safety maintained.

## 2014-02-19 NOTE — Progress Notes (Signed)
Adult Psychoeducational Group Note  Date:  02/19/2014 Time:  9:33 PM  Group Topic/Focus:  Wrap-Up Group:   The focus of this group is to help patients review their daily goal of treatment and discuss progress on daily workbooks.  Participation Level:  Active  Participation Quality:  Inattentive  Affect:  Labile  Cognitive:  Oriented  Insight: Limited  Engagement in Group:  Limited  Modes of Intervention:  Socialization and Support  Additional Comments:  Patient attended and participated in group tonight. He reports having a relaxing day. H socialized with peers, he went for his meals and attended groups. The patient stated that for the past seven months of this year, he went through tribulations and testing. He looks forward to a better season for the rest of the year.  Lita MainsFrancis, Harith Mccadden [facility] 02/19/2014, 9:33 PM

## 2014-02-19 NOTE — Progress Notes (Signed)
Adult Psychoeducational Group Note  Date:  02/19/2014 Time:  6:33 PM  Group Topic/Focus:  Wellness Toolbox:   The focus of this group is to discuss various aspects of wellness, balancing those aspects and exploring ways to increase the ability to experience wellness.  Patients will create a wellness toolbox for use upon discharge.  Participation Level:  Active  Participation Quality:  Attentive  Affect:  Appropriate  Cognitive:  Disorganized  Insight: Limited  Engagement in Group:  Off Topic  Modes of Intervention:  Education and Support  Additional Comments:  Pt participated in group.  Marquis Lunchbrahim, Kayslee Furey 02/19/2014, 6:33 PM

## 2014-02-19 NOTE — [facility] Group Notes (Signed)
[facility] LCSW Group Therapy  02/19/2014 1:15 pm  Type of Therapy: Process Group Therapy  Participation Level:  Active  Participation Quality:  Appropriate  Affect:  Flat  Cognitive:  Oriented  Insight:  Improving  Engagement in Group:  Limited  Engagement in Therapy:  Limited  Modes of Intervention:  Activity, Clarification, Education, Problem-solving and Support  Summary of Progress/Problems: Today's group addressed the issue of overcoming obstacles.  Patients were asked to identify their biggest obstacle post d/c that stands in the way of their on-going success, and then problem solve as to how to manage this.  Jerry Carter appeared to be dozing during the first half of group.  Later he spent time writing in his sheaf of papers.  He was not engaged in the conversation until I asked him directly what makes him hopeful.  He talked about his life and doing well and working towards judgment day when he will either be allowed to enter heaven or be cast into hell.  He also talked about the rapture and being taken up to heaven directly.  He then went on to talk excitedly and without allowing time to interrupt about his book entitled "Pride vs Humility" and how all his discombobulation  is the result of pride.  At one point he was talking about "The Holy Hip Hop word."   After I cut him off, he abruptly got up and excused himself.  [name], [name] 02/19/2014   3:42 PM '

## 2014-02-20 DIAGNOSIS — I10 Essential (primary) hypertension: Secondary | ICD-10-CM | POA: Diagnosis present

## 2014-02-20 DIAGNOSIS — F319 Bipolar disorder, unspecified: Secondary | ICD-10-CM

## 2014-02-20 MED ORDER — TRAZODONE HCL 100 MG PO TABS: 100.0000 mg | ORAL_TABLET | Freq: Every evening | ORAL | Status: DC | PRN

## 2014-02-20 MED ORDER — LISINOPRIL 5 MG PO TABS
5.0000 mg | ORAL_TABLET | Freq: Every day | ORAL | Status: DC
  Administered 2014-02-20 – 2014-02-26 (×7): 5 mg via ORAL
  Filled 2014-02-20: qty 14
  Filled 2014-02-20: qty 1
  Filled 2014-02-20 (×2): qty 14
  Filled 2014-02-20 (×2): qty 1
  Filled 2014-02-20 (×4): qty 14
  Filled 2014-02-20: qty 1

## 2014-02-20 MED ORDER — DIVALPROEX SODIUM 500 MG PO DR TAB: 500.0000 mg | DELAYED_RELEASE_TABLET | Freq: Three times a day (TID) | ORAL | Status: DC

## 2014-02-20 MED ORDER — LISINOPRIL 5 MG PO TABS: 5.0000 mg | ORAL_TABLET | Freq: Every day | ORAL | Status: DC

## 2014-02-20 MED ORDER — ARIPIPRAZOLE 5 MG PO TABS: 5.0000 mg | ORAL_TABLET | Freq: Two times a day (BID) | ORAL | Status: DC

## 2014-02-20 NOTE — Progress Notes (Signed)
Pt presents anxious this morning. Pt has flight of ideas, rapid pressured speech, disorganized thoughts and is religiously  preoccupied. Pt impulsive and fidgety on approach, pt was observed standing in hallway dancing and singing gospel rap music, during the time he was suppose to be taking meds. Pt has poor insight and judgement. Pt is pleasant and do not appear to be a threat to himself or others. Pt denies SI/HI/AVH.  Medications administered as ordered per prn. Verbal support given. Pt encouraged to attend groups. 15 minute checks performed for safety.  Pt safety maintained at this.

## 2014-02-20 NOTE — Consult Note (Signed)
Patient seen, evaluated by me. Patient is delusional and needs inpatient psychiatric care for stabilization and treatment

## 2014-02-20 NOTE — Progress Notes (Signed)
Patient ID: Jerry Carter, male   DOB: November 14, 1960, 53 y.o.   MRN: [id number] [facility] Note  02/20/2014 1:50 PM Jerry Carter  MRN:  [id number] Subjective:  Pt seen and chart reviewed. Pt denies SI, HI, and AVH, contracts for safety. However, pt is very tangential, continually having to be redirected during the assessment. Pt states "Can I go now? Can I go today?". Pt was informed that we must observe him for a few more days. Pt states "I gotta find my way in this world so I'll just go out and find a place to sleep and God will have a plan". Pt was encouraged to speak to a social worker to find housing and he agreed.   Diagnosis:   DSM5: Schizophrenia Disorders:  Delusional Disorder (297.1) and Psychotic Disorder (298.8) Obsessive-Compulsive Disorders:   Trauma-Stressor Disorders:   Substance/Addictive Disorders:   Depressive Disorders:  Disruptive Mood Dysregulation Disorder (296.99) Total Time spent with patient:   Axis I: Schizoaffective disorder Axis II: Deferred Axis III:  Past Medical History  Diagnosis Date  . Hypertension    Axis IV: other psychosocial or environmental problems and problems related to social environment  ADL's:  Intact  Sleep: Fair  Appetite:  Fair  Suicidal Ideation:  Plan:  denies Intent:  denies Means:  denies Homicidal Ideation:  Denies  AEB (as evidenced by):  Psychiatric Specialty Exam: Physical Exam  Psychiatric: His affect is labile. His speech is rapid and/or pressured. He is hyperactive and actively hallucinating. Thought content is delusional. Cognition and memory are normal. He expresses impulsivity.    Review of Systems  Constitutional: Negative.   HENT: Negative.   Eyes: Negative.   Respiratory: Negative.   Cardiovascular: Negative.   Gastrointestinal: Negative.   Genitourinary: Negative.   Musculoskeletal: Negative.   Skin: Negative.   Neurological: Negative.   Endo/Heme/Allergies: Negative.   Psychiatric/Behavioral:  The patient is nervous/anxious and has insomnia.     Blood pressure 143/96, pulse 105, temperature 98.2 F (36.8 C), temperature source Oral, resp. rate 18, height 5\' 8"  (1.727 m), weight 64.864 kg (143 lb).Body mass index is 21.75 kg/(m^2).  General Appearance: Disheveled  Eye Contact::  Minimal  Speech:  Pressured  Volume:  Increased  Mood:  Irritable  Affect:  Labile and Full Range  Thought Process:  Disorganized  Orientation:  Full (Time, Place, and Person)  Thought Content:  Delusions  Suicidal Thoughts:  No  Homicidal Thoughts:  No  Memory:  Immediate;   Fair Recent;   Fair Remote;   Fair  Judgement:  Impaired  Insight:  Lacking  Psychomotor Activity:  Increased  Concentration:  Fair  Recall:  FiservFair  Fund of Knowledge:Fair  Language: Good  Akathisia:  No  Handed:  Right  AIMS (if indicated):     Assets:  Communication Skills Desire for Improvement Physical Health  Sleep:  Number of Hours: 2   Musculoskeletal: Strength & Muscle Tone: within normal limits Gait & Station: normal Patient leans: N/A  Current Medications: Current Facility-Administered Medications  Medication Dose Route Frequency Provider Last Rate Last Dose  . acetaminophen (TYLENOL) tablet 650 mg  650 mg Oral Q4H PRN Nanine MeansJamison Lord, NP      . alum & mag hydroxide-simeth (MAALOX/MYLANTA) 200-200-20 MG/5ML suspension 30 mL  30 mL Oral PRN Nanine MeansJamison Lord, NP      . ARIPiprazole (ABILIFY) tablet 5 mg  5 mg Oral BID PC Mojeed Akintayo   5 mg at 02/20/14 0734  . divalproex (  DEPAKOTE) DR tablet 500 mg  500 mg Oral TID PC Cleotis Nipper, MD   500 mg at 02/20/14 1310  . feeding supplement (ENSURE COMPLETE) (ENSURE COMPLETE) liquid 237 mL  237 mL Oral TID BM Tenny Craw, RD   237 mL at 02/20/14 1309  . hydrochlorothiazide (HYDRODIURIL) tablet 25 mg  25 mg Oral Daily Nanine Means, NP   25 mg at 02/20/14 0733  . lisinopril (PRINIVIL,ZESTRIL) tablet 5 mg  5 mg Oral Daily Verne Spurr, PA-C   5 mg at 02/20/14 1017   . LORazepam (ATIVAN) tablet 1 mg  1 mg Oral Q6H PRN Fransisca Kaufmann, NP   1 mg at 02/18/14 0759  . magnesium hydroxide (MILK OF MAGNESIA) suspension 30 mL  30 mL Oral Daily PRN Nanine Means, NP      . OLANZapine zydis (ZYPREXA) disintegrating tablet 10 mg  10 mg Oral Q8H PRN Mojeed Akintayo   10 mg at 02/18/14 0800  . ondansetron (ZOFRAN) tablet 4 mg  4 mg Oral Q8H PRN Nanine Means, NP      . potassium chloride (K-DUR,KLOR-CON) CR tablet 10 mEq  10 mEq Oral Daily Nanine Means, NP   10 mEq at 02/20/14 0733  . traZODone (DESYREL) tablet 100 mg  100 mg Oral QHS PRN Fransisca Kaufmann, NP        Lab Results: No results found for this or any previous visit (from the past 48 hour(s)).  Physical Findings: AIMS: Facial and Oral Movements Muscles of Facial Expression: None, normal Lips and Perioral Area: None, normal Jaw: None, normal Tongue: None, normal,Extremity Movements Upper (arms, wrists, hands, fingers): None, normal Lower (legs, knees, ankles, toes): None, normal, Trunk Movements Neck, shoulders, hips: None, normal, Overall Severity Severity of abnormal movements (highest score from questions above): None, normal Incapacitation due to abnormal movements: None, normal Patient's awareness of abnormal movements (rate only patient's report): No Awareness, Dental Status Current problems with teeth and/or dentures?: No Does patient usually wear dentures?: No  CIWA:    COWS:     Treatment Plan Summary: Daily contact with patient to assess and evaluate symptoms and progress in treatment Medication management  Plan:1. Admit for crisis management and stabilization. 2. Medication management to reduce current symptoms to base line and improve the  patient's overall level of functioning: -Continue Abilify 5mg  po bid for delusions -Increase Depakote ER 500mg  po Tid for mood stabilization -Continue Trazodone 100mg  po qhs as needed for insomnia 3. Treat health problems as indicated.  4. Develop treatment  plan to decrease risk of relapse upon discharge and the need for  readmission. 5. Psycho-social education regarding relapse prevention and self care. 6. Health care follow up as needed for medical problems. 7. Restart home medications where appropriate.   Medical Decision Making Problem Points:  Established problem, stable/improving (1), Review of last therapy session (1) and Review of psycho-social stressors (1) Data Points:  Order Aims Assessment (2) Review or order clinical lab tests (1) Review of medication regiment & side effects (2) Review of new medications or change in dosage (2)  I certify that inpatient services furnished can reasonably be expected to improve the patient's condition.   Beau Fanny, FNP-BC 02/20/2014, 1:50 PM

## 2014-02-20 NOTE — [facility] Group Notes (Signed)
Adult Psychoeducational Group Note  Date:  02/20/2014 Time:  9:20 PM  Group Topic/Focus:  Wrap-Up Group:   The focus of this group is to help patients review their daily goal of treatment and discuss progress on daily workbooks.  Participation Level:  Minimal  Participation Quality:  Hyperverbal  Affect:  Anxious  Cognitive:  Disorganized  Insight: Lacking  Engagement in Group:  Lacking  Modes of Intervention:  Discussion  Additional Comments:  Jerry FearingJames stated his day was great.  He was learning to walk closer to the holy spirit.  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  He was hyper verbal and hyper spiritual.  Jerry Carter, Jerry Carter 02/20/2014, 9:20 PM

## 2014-02-20 NOTE — [facility] Group Notes (Signed)
[facility] LCSW Group Therapy  02/20/2014 1:20 PM   Type of Therapy:  Group Therapy  Participation Level:  Active  Participation Quality:  Attentive  Affect:  Appropriate  Cognitive:  Appropriate  Insight:  Improving  Engagement in Therapy:  Engaged  Modes of Intervention:  Clarification, Education, Exploration and Socialization  Summary of Progress/Problems: Today's group focused on relapse prevention.  We defined the term, and then brainstormed on ways to prevent relapse.  Jerry Carter had multiple papers and a pencil that he kept dropping as he was rifling through everything.  Very distracting.  He brought up his book again today, but was redirectable from that.  He states that since he was diagnosed at the age of 89, he has gone through multiple episodes of relapse-all of them precipitated by stopping his meds.  So he believes he needs meds, but also believes he can be healed.  At that point, he broke out in a rap/song about being healed by the [facility].  Jerry Carter, [name] 02/20/2014 , 1:20 PM

## 2014-02-21 MED ORDER — ARIPIPRAZOLE 5 MG PO TABS
5.0000 mg | ORAL_TABLET | Freq: Every morning | ORAL | Status: DC
  Administered 2014-02-22 – 2014-02-25 (×4): 5 mg via ORAL
  Filled 2014-02-21 (×6): qty 1

## 2014-02-21 MED ORDER — ARIPIPRAZOLE 10 MG PO TABS
10.0000 mg | ORAL_TABLET | Freq: Every day | ORAL | Status: DC
  Administered 2014-02-21 – 2014-02-25 (×5): 10 mg via ORAL
  Filled 2014-02-21 (×8): qty 1

## 2014-02-21 NOTE — Progress Notes (Signed)
Pt presents anxious this morning. Pt has flight of ideas, rapid pressured speech, disorganized thoughts and is religiously preoccupied. Pt impulsive and fidgety on approach, pt continues to  dance in hallway, sing gospel songs and pray before he takes his meds. Pt walks around all day with his bible in his hand. Pt is pleasant and do not appear to be a threat to himself or others. Pt denies SI/HI/AVH.  Medications administered as ordered per prn. Verbal support given. Pt encouraged to attend groups. 15 minute checks performed for safety.  Pt safety maintained at this. No complaints verbalized by pt at this time.

## 2014-02-21 NOTE — [facility] Group Notes (Signed)
[facility]   02/21/2014 11:31 AM  Participation Quality:  Engaged  Mood/Affect:  Disorganized, pressured, manic  Depression Rating:  0  Anxiety Rating:  0  Thoughts of Suicide:  No Will you contract for safety?   NA  Current AVH:  Denies  Plan for Discharge/Comments:  Fayrene FearingJames has a list of requests as he shuffles his reams of papers and asks in a pressured manner.  A white 2 inch binder, several pens, lined college rule paper and a calculator.  I told him I would check into his requests.  Talked a mtg on Aug 10th with his financial planner.  Also states that he plans to go to the shelter at d/c.    Transportation Means: bus  Supports: unk  Sprint Nextel Corporationorth, JacksonportRodney B

## 2014-02-21 NOTE — Progress Notes (Signed)
Did not attended group 

## 2014-02-21 NOTE — Progress Notes (Addendum)
Patient ID: Jerry Carter, male   DOB: January 29, 1961, 53 y.o.   MRN: [id number] [facility]  02/21/2014 5:19 PM Jerry Carter  MRN:  [id number] Subjective:  Patient states" I have sweet rest- God is in me- I am young in Christ" Objective:  Patient is pleasant and polite, but remains disorganized, pressured in speech, and religiously preoccupied, with persistent references to God and Jesus. Chart notes indicate he continues to have some disorganized behavior on unit, dancing at times, singing in the hallway. 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   Diagnosis:   DSM5: Schizophrenia Disorders:  Delusional Disorder (297.1) and Psychotic Disorder (298.8)  Total Time spent with patient: 20 minutes   Axis I: Schizoaffective disorder Axis II: Deferred Axis III:  Past Medical History  Diagnosis Date  . Hypertension    Axis IV: other psychosocial or environmental problems and problems related to social environment  ADL's:  Fair   Sleep: Fair  Appetite:  Fair  Suicidal Ideation:  Plan:  denies Intent:  denies Means:  denies Homicidal Ideation:  Denies  AEB (as evidenced by):  Psychiatric Specialty Exam: Physical Exam  Psychiatric: His affect is labile. His speech is rapid and/or pressured. He is hyperactive and actively hallucinating. Thought content is delusional. Cognition and memory are normal. He expresses impulsivity.    Review of Systems  Constitutional: Negative.   HENT: Negative.   Eyes: Negative.   Respiratory: Negative.   Cardiovascular: Negative.   Gastrointestinal: Negative.   Genitourinary: Negative.   Musculoskeletal: Negative.   Skin: Negative.   Neurological: Negative.   Endo/Heme/Allergies: Negative.   Psychiatric/Behavioral: The  patient is nervous/anxious and has insomnia.     Blood pressure 123/74, pulse 79, temperature 97.4 F (36.3 C), temperature source Oral, resp. rate 18, height 5\' 8"  (1.727 m), weight 64.864 kg (143 lb).Body mass index is 21.75 kg/(m^2).  General Appearance: fairly groomed   Patent attorneyye Contact::  Good eye contact  Speech:  Remains pressured in speech  Volume:  Increased  Mood: " Good"   Affect:  Expansive   Thought Process:  Disorganized/ tangential   Orientation:  Full (Time, Place, and Person)  Thought Content:  Delusions- religiously preoccupied, grandiose . Denies hallucinations and does not appear internally preoccupied   Suicidal Thoughts:  No- denies any suicidal or homicidal ideations.  Homicidal Thoughts:  No  Memory:  NA   Judgement:  Impaired  Insight:  Lacking  Psychomotor Activity:  Increased  Concentration:  Fair  Recall:  Fair  Fund of Knowledge:Fair  Language: Good  Akathisia:  No  Handed:  Right  AIMS (if indicated):     Assets:  Communication Skills Desire for Improvement Physical Health  Sleep:  Number of Hours: 3.5   Musculoskeletal: Strength & Muscle Tone: within normal limits Gait & Station: normal Patient leans: N/A  Current Medications: Current Facility-Administered Medications  Medication Dose Route Frequency Provider Last Rate Last Dose  . acetaminophen (TYLENOL) tablet 650 mg  650 mg Oral Q4H PRN Nanine MeansJamison Lord, NP      . alum & mag hydroxide-simeth (MAALOX/MYLANTA) 200-200-20 MG/5ML suspension 30 mL  30 mL Oral PRN Nanine MeansJamison Lord, NP      . ARIPiprazole (ABILIFY) tablet 5 mg  5 mg Oral BID PC Mojeed Akintayo   5 mg at 02/21/14 1654  . divalproex (DEPAKOTE) DR tablet 500 mg  500 mg Oral TID PC Cleotis Nipper, MD   500 mg at 02/21/14 1653  . feeding supplement (ENSURE COMPLETE) (ENSURE COMPLETE) liquid 237 mL  237 mL Oral TID BM Tenny Craw, RD   237 mL at 02/21/14 1246  . hydrochlorothiazide (HYDRODIURIL) tablet 25 mg  25 mg Oral Daily Nanine Means, NP    25 mg at 02/21/14 0734  . lisinopril (PRINIVIL,ZESTRIL) tablet 5 mg  5 mg Oral Daily Verne Spurr, PA-C   5 mg at 02/21/14 0734  . LORazepam (ATIVAN) tablet 1 mg  1 mg Oral Q6H PRN Fransisca Kaufmann, NP   1 mg at 02/18/14 0759  . magnesium hydroxide (MILK OF MAGNESIA) suspension 30 mL  30 mL Oral Daily PRN Nanine Means, NP      . OLANZapine zydis (ZYPREXA) disintegrating tablet 10 mg  10 mg Oral Q8H PRN Mojeed Akintayo   10 mg at 02/18/14 0800  . ondansetron (ZOFRAN) tablet 4 mg  4 mg Oral Q8H PRN Nanine Means, NP      . potassium chloride (K-DUR,KLOR-CON) CR tablet 10 mEq  10 mEq Oral Daily Nanine Means, NP   10 mEq at 02/21/14 0734  . traZODone (DESYREL) tablet 100 mg  100 mg Oral QHS PRN Fransisca Kaufmann, NP        Lab Results: No results found for this or any previous visit (from the past 48 hour(s)).  Physical Findings: AIMS: Facial and Oral Movements Muscles of Facial Expression: None, normal Lips and Perioral Area: None, normal Jaw: None, normal Tongue: None, normal,Extremity Movements Upper (arms, wrists, hands, fingers): None, normal Lower (legs, knees, ankles, toes): None, normal, Trunk Movements Neck, shoulders, hips: None, normal, Overall Severity Severity of abnormal movements (highest score from questions above): None, normal Incapacitation due to abnormal movements: None, normal Patient's awareness of abnormal movements (rate only patient's report): No Awareness, Dental Status Current problems with teeth and/or dentures?: No Does patient usually wear dentures?: No  CIWA:    COWS:     Assessment: Patient remains pressured, disorganized, religiously preoccupied. He denies medication side effects. Insight is limited.  Although presenting with above psychotic, manic symptoms, he is pleasant and not threatening on unit, more jovial than irritable at this time.   Treatment Plan Summary: Daily contact with patient to assess and evaluate symptoms and progress in treatment Medication  management  Plan: Continue inpatient treatment /stabilization.  2. Medication management to reduce current symptoms to base line and improve the  patient's overall level of functioning:  Increase Abilify to 5 mgrs QAM and 10 mgrs QHS  Depakote ER 500mg  po TID  Ativan/ Zyprexa PRNs   for agitation Check Valproic Acid Serum level in AM     Medical Decision Making Problem Points:  Established problem, stable/improving (1) and Review of last therapy session (1) Data Points:  Review of medication regiment & side effects (2) Review of new medications or change in dosage (2)  I certify that inpatient services furnished can reasonably be expected to improve the patient's condition.   Nehemiah Massed, FNP-BC 02/21/2014, 5:19 PM

## 2014-02-21 NOTE — Progress Notes (Signed)
D   Pt pacing the hallway making religious statements and carrying his bible    He is hyperverbal and intrusive at times but is easily redirected    He is pleasant and has been attending groups    A   Verbal support given   Medications offered and effectiveness monitored   Q 15 min checks   Redirect as needed for safety on the milieu R   Pt is safe at present

## 2014-02-21 NOTE — [facility] Group Notes (Signed)
[facility]  02/21/2014 , 1:36 PM    Type of Therapy:  [facility] Presentation  Participation Level:  Invited.  Chose to not attend.  Chose instead to go to his room to organize his papers in a new folder.  Summary of Progress/Problems:  Onalee HuaDavid from [facility] came to present his recovery story and play the guitar.    Jerry Carter, [name] B 02/21/2014 , 1:36 PM

## 2014-02-22 LAB — VALPROIC ACID LEVEL: Valproic Acid Lvl: 113.4 ug/mL — ABNORMAL HIGH (ref 50.0–100.0)

## 2014-02-22 NOTE — Progress Notes (Signed)
Patient ID: Jerry Carter, male   DOB: May 23, 1961, 53 y.o.   MRN: [id number] D: Patient sitting in room on approach. Pt mood and affect appeared anxious. Pt is hyperviglant and is religious preoccupied.  Pt holding his bible states he needs to "write and organized some papers to get his claim". Pt reports he cannot go to [facility] again because he has lost weight. Pt denies SI/HI/AVH but is observed talking to self and responding to internal stimuli. Pt attended evening wrap up group. Cooperative with assessment. No acute distressed noted at this time.   A: Met with pt 1:1. Medications administered as prescribed. Writer encouraged pt to discuss feelings. Pt encouraged to come to staff with any question or concerns.   R: Patient remains safe. He is complaint with medications and denies any adverse reaction. Continue current POC.

## 2014-02-22 NOTE — Tx Team (Addendum)
  Interdisciplinary Treatment Plan Update   Date Reviewed:  02/22/2014  Time Reviewed:  9:22 AM  Progress in Treatment:   Attending groups: Yes Participating in groups: Yes Taking medication as prescribed: Yes  Tolerating medication: Yes Family/Significant other contact made: Yes  Patient understands diagnosis: Yes  Discussing patient identified problems/goals with staff: Yes Medical problems stabilized or resolved: Yes Denies suicidal/homicidal ideation: Yes Patient has not harmed self or others: Yes  For review of initial/current patient goals, please see plan of care.  Estimated Length of Stay:  4-5 days  Reason for Continuation of Hospitalization: Mania Medication stabilization  New Problems/Goals identified:  N/A  Discharge Plan or Barriers:   go to shelter, follow up outpt  Additional Comments:  Patient stated he did not sleep and was not tired, denies hallucinations. Hypomania continues with no insight, he would like to discharge despite lack of sleep and mood stability. [name] feels everything is "fine". Depakote increased on Tuesday, Abilify doubled yesterday.  Attendees:  Signature: Thedore MinsMojeed Akintayo, MD 02/22/2014 9:22 AM   Signature: Richelle Itood Albena Comes, LCSW 02/22/2014 9:22 AM  Signature: Fransisca KaufmannLaura Davis, NP 02/22/2014 9:22 AM  Signature: Joslyn Devonaroline Beaudry, RN 02/22/2014 9:22 AM  Signature: Liborio NixonPatrice White, RN 02/22/2014 9:22 AM  Signature:  02/22/2014 9:22 AM  Signature:   02/22/2014 9:22 AM  Signature:    Signature:    Signature:    Signature:    Signature:    Signature:      Scribe for Treatment Team:   [employer] Kanani Mowbray, LCSW  02/22/2014 9:22 AM

## 2014-02-22 NOTE — Progress Notes (Signed)
[facility]  02/22/2014 8:39 AM Jerry Carter  MRN:  [id number] Subjective:  Patient stated he did not sleep and was not tired, denies hallucinations.  Hypomania continues with no insight, he would like to discharge despite lack of sleep and mood stability.  Mr. Jerry Carter feels everything is "fine". Diagnosis:   DSM5:  Total Time spent with patient: 20 minutes  Axis I: Schizoaffective Disorder Axis II: Deferred Axis III:  Past Medical History  Diagnosis Date  . Hypertension   . Bipolar disorder    Axis IV: other psychosocial or environmental problems, problems related to social environment and problems with primary support group Axis V: 11-20 some danger of hurting self or others possible OR occasionally fails to maintain minimal personal hygiene OR gross impairment in communication  ADL's:  Intact  Sleep: Poor  Appetite:  Fair  Suicidal Ideation:  Denies  Homicidal Ideation:  Denies   Psychiatric Specialty Exam: Physical Exam  Constitutional: He is oriented to person, place, and time. He appears well-developed and well-nourished.  HENT:  Head: Normocephalic and atraumatic.  Neck: Normal range of motion.  Respiratory: Effort normal.  Musculoskeletal: Normal range of motion.  Neurological: He is alert and oriented to person, place, and time.    Review of Systems  Constitutional: Negative.   HENT: Negative.   Eyes: Negative.   Respiratory: Negative.   Cardiovascular: Negative.   Gastrointestinal: Negative.   Genitourinary: Negative.   Musculoskeletal: Negative.   Skin: Negative.   Neurological: Negative.   Endo/Heme/Allergies: Negative.     Blood pressure 140/82, pulse 76, temperature 97.8 F (36.6 C), temperature source Oral, resp. rate 20, height 5\' 8"  (1.727 m), weight 64.864 kg (143 lb).Body mass index is 21.75 kg/(m^2).  General Appearance: Casual  Eye Contact::  Fair  Speech:  Slightly pressured  Volume:  Normal  Mood:  Anxious  Affect:  Blunt   Thought Process:  Tangential  Orientation:  Full (Time, Place, and Person)  Thought Content:  hyperreligious  Suicidal Thoughts:  No  Homicidal Thoughts:  No  Memory:  Immediate;   Fair Recent;   Fair Remote;   Fair  Judgement:  Impaired  Insight:  Lacking  Psychomotor Activity:  Increased  Concentration:  Fair  Recall:  [employer]Fair  Fund of Knowledge:Fair  Language: Good  Akathisia:  No  Handed:  Right  AIMS (if indicated):     Assets:  Leisure Time Physical Health Resilience Social Support  Sleep:  Number of Hours: 0   Musculoskeletal: Strength & Muscle Tone: within normal limits Gait & Station: normal Patient leans: N/A  Current Medications: Current Facility-Administered Medications  Medication Dose Route Frequency Provider Last Rate Last Dose  . acetaminophen (TYLENOL) tablet 650 mg  650 mg Oral Q4H PRN Nanine MeansJamison Lord, NP      . alum & mag hydroxide-simeth (MAALOX/MYLANTA) 200-200-20 MG/5ML suspension 30 mL  30 mL Oral PRN Nanine MeansJamison Lord, NP      . ARIPiprazole (ABILIFY) tablet 10 mg  10 mg Oral QHS Nehemiah MassedFernando Cobos, MD   10 mg at 02/21/14 2120  . ARIPiprazole (ABILIFY) tablet 5 mg  5 mg Oral q morning - 10a Nehemiah MassedFernando Cobos, MD   5 mg at 02/22/14 [id number]  . divalproex (DEPAKOTE) DR tablet 500 mg  500 mg Oral TID PC Cleotis NipperSyed T Arfeen, MD   500 mg at 02/22/14 [id number]  . feeding supplement (ENSURE COMPLETE) (ENSURE COMPLETE) liquid 237 mL  237 mL Oral TID BM Tenny CrawHeather S Winkler, RD  237 mL at 02/21/14 2122  . hydrochlorothiazide (HYDRODIURIL) tablet 25 mg  25 mg Oral Daily Nanine Means, NP   25 mg at 02/22/14 1610  . lisinopril (PRINIVIL,ZESTRIL) tablet 5 mg  5 mg Oral Daily Verne Spurr, PA-C   5 mg at 02/22/14 9604  . LORazepam (ATIVAN) tablet 1 mg  1 mg Oral Q6H PRN Fransisca Kaufmann, NP   1 mg at 02/18/14 0759  . magnesium hydroxide (MILK OF MAGNESIA) suspension 30 mL  30 mL Oral Daily PRN Nanine Means, NP      . OLANZapine zydis (ZYPREXA) disintegrating tablet 10 mg  10 mg Oral Q8H PRN Mojeed  Akintayo   10 mg at 02/18/14 0800  . ondansetron (ZOFRAN) tablet 4 mg  4 mg Oral Q8H PRN Nanine Means, NP      . potassium chloride (K-DUR,KLOR-CON) CR tablet 10 mEq  10 mEq Oral Daily Nanine Means, NP   10 mEq at 02/22/14 5409  . traZODone (DESYREL) tablet 100 mg  100 mg Oral QHS PRN Fransisca Kaufmann, NP        Lab Results: No results found for this or any previous visit (from the past 48 hour(s)).  Physical Findings: AIMS: Facial and Oral Movements Muscles of Facial Expression: None, normal Lips and Perioral Area: None, normal Jaw: None, normal Tongue: None, normal,Extremity Movements Upper (arms, wrists, hands, fingers): None, normal Lower (legs, knees, ankles, toes): None, normal, Trunk Movements Neck, shoulders, hips: None, normal, Overall Severity Severity of abnormal movements (highest score from questions above): None, normal Incapacitation due to abnormal movements: None, normal Patient's awareness of abnormal movements (rate only patient's report): No Awareness, Dental Status Current problems with teeth and/or dentures?: No Does patient usually wear dentures?: No  CIWA:    COWS:     Treatment Plan Summary: Daily contact with patient to assess and evaluate symptoms and progress in treatment Medication management  Plan:  Review of chart, vital signs, medications, and notes. 1-Admit for crisis management and stabilization.  Estimated length of stay 3-4 days past his current stay of 6 2-Individual and group therapy encouraged 3-Medication management for mania to reduce current symptoms to base line and improve the patient's overall level of functioning:  Medications reviewed with the patient and he stated no untoward effects 4-Coping skills for mania developing-- 5-Continue crisis stabilization and management 6-Address health issues--monitoring vital signs, stable 7-Treatment plan in progress to prevent relapse of mania 8-Psychosocial education regarding relapse prevention and  self-care 9-Health care follow up as needed for any health concerns 10-Call for consult with hospitalist for additional specialty patient services as needed.  Medical Decision Making Problem Points:  Review of last therapy session (1) Data Points:  Review of medication regiment & side effects (2)  I certify that inpatient services furnished can reasonably be expected to improve the patient's condition.   Nanine Means, PMH-NP 02/22/2014, 8:39 AM

## 2014-02-22 NOTE — [facility] Group Notes (Signed)
[facility] Group Notes:  (Nursing/MHT/Case Management/Adjunct)  Date:  02/22/2014  Time:  9:39 AM  Type of Therapy:  Psychoeducational Skills  Participation Level:  Did Not Attend  Participation Quality:    Affect:    Cognitive:    Insight:    Engagement in Group:    Modes of Intervention:    Summary of Progress/Problems: Morning wellness group  Andres Egeritchett, Estes Lehner Hundley 02/22/2014, 9:39 AM

## 2014-02-22 NOTE — Progress Notes (Signed)
Adult Psychoeducational Group Note  Date:  02/22/2014 Time:  10:34 PM  Group Topic/Focus:  Wrap-Up Group:   The focus of this group is to help patients review their daily goal of treatment and discuss progress on daily workbooks.  Participation Level:  Active  Participation Quality:  Appropriate  Affect:  Appropriate  Cognitive:  Appropriate  Insight: Appropriate  Engagement in Group:  Engaged  Modes of Intervention:  Discussion  Additional Comments:    Kenitra Leventhal A 02/22/2014, 10:34 PM

## 2014-02-22 NOTE — Progress Notes (Signed)
Patient ID: Jerry Carter, male   DOB: 1960-08-22, 53 y.o.   MRN: [id number] D: Patient pacing up and down talking to self. Pt mood and affect appeared anxious. Pt is hyperviglant and is religious preoccupied.  Pt prayed over his medication before taking them. Pt reports one of the elders will be visiting him tomorrow and should be able to go home with him. Pt written lots of bible verses on pieses of paper spread across his bed. Pt denies SI/HI/AVH but is observed talking to self and responding to internal stimuli. Pt attended evening wrap up group.  No acute distressed noted at this time.   A: Met with pt 1:1. Medications administered as prescribed. Writer encouraged pt to discuss feelings. Pt encouraged to come to staff with any question or concerns.   R: Patient remains safe. He is complaint with medications and denies any adverse reaction. Continue current POC.

## 2014-02-22 NOTE — Progress Notes (Addendum)
Patient ID: Jerry Carter, male   DOB: [birth year], 53 y.o.   MRN: [id number]  D: Patient pleasant on approach this am. Patient continues to be hyper verbal on approach today. Had to pray over his medications prior to taking them. Continues with paranoia and religious preoccupation but compliant with meds at present. Continues to write on papers in room and has them lying on his bed. Reports sleep poor during the night and denies taking sleep meds. Denies depressed mood or SI. Still appears preoccupied when speaking with him. A: Staff will monitor on q 15 minute checks, follow treatment plan, and give meds as ordered. R: Cooperative on the unit and with taking meds at present.

## 2014-02-22 NOTE — [facility] Group Notes (Signed)
[facility] Group Notes:  (Counselor/Nursing/MHT/Case Management/Adjunct)  02/22/2014 1:15PM  Type of Therapy:  Group Therapy  Participation Level:  Active  Participation Quality:  Appropriate  Affect:  Flat  Cognitive:  Oriented  Insight:  Improving  Engagement in Group:  Limited  Engagement in Therapy:  Limited  Modes of Intervention:  Discussion, Exploration and Socialization  Summary of Progress/Problems: The topic for group was balance in life.  Pt participated in the discussion about when their life was in balance and out of balance and how this feels.  Pt discussed ways to get back in balance and short term goals they can work on to get where they want to be. Fayrene FearingJames was relatively subdued in group.  He snoozed during group, and was not rifling through his papers to the extent that he usually does.  He had nothing meaningful to contribute to the discussion.  At the end of group, he cornered me and began excitedly talking about contacting his elder to talk about his court paper.  He was easily redirected, and the focus switched to getting out of here.  He stated he would "get his priorities together and work on a plan" so we could evaluate again tomorrow.   [name], Aileene Lanum B 02/22/2014 3:05 PM

## 2014-02-23 NOTE — [facility] Group Notes (Signed)
[facility]   02/23/2014 8:59 AM  Participation Quality:  Engaged  Mood/Affect:  pressured  Depression Rating:    Anxiety Rating:    Thoughts of Suicide:  No Will you contract for safety?   NA  Current AVH:  denies  Plan for Discharge/Comments:  "[facility]  I am going to get a job at [employer].  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  I have to get some clothes with my check to prepare for the change in weather."  Pt is more focused, but still writing on reams of paper that surround him on the floor and surrounding seats in the day room.  Transportation Means:   Supports:  Daryel GeraldNorth, Sydelle Sherfield B

## 2014-02-23 NOTE — Progress Notes (Signed)
Patient ID: Jerry Carter, male   DOB: 10/01/1960, 53 y.o.   MRN: [id number] D. Patient presents with euphoric mood, affect congruent. Jerry Carter continues to be disorganized, tangential, and hypomanic. His speech remains pressured, and tangential. He is also very religiously preoccupied, and bizarre at times, writing on several reams of paper and note pads. He states to Jerry research associatewriter ''I'm going to get my job at the [employer] and you pray to god, because god is the almighty, and I'll just pray that it happens and I'll be able to get part time, and then get bus passes for three months and new clothes. '' A. Medications given as ordered. Support and encouragement provided. Discussed above information, with treatment team and Dr. Lolly MustacheArfeen as patient shows little improvement with persistent manic behaviors. R. Patient is safe. Will continue to monitor q 15 minutes for safety.

## 2014-02-23 NOTE — Progress Notes (Signed)
[facility] MD Progress Note  02/23/2014 12:45 PM Jerry Carter  MRN:  [id number]  Subjective:  I am taking my medication.    Objective Patient seen and chart reviewed.  Patient remains very labile, hypomanic and easily irritable.  He is taking Abilify 15 mg daily.  He is sleeping better.  However he continues to have no insight into his illness.  He noticed disruptive in the groups.  He has no tremors or shakes.  He's not aggressive or violent.  He requires redirection and encouragement to take his medication.  His mood remains irritable and his affect was labile.    Diagnosis:   DSM5:  Total Time spent with patient: 20 minutes  Axis I: Schizoaffective Disorder Axis II: Deferred Axis III:  Past Medical History  Diagnosis Date  . Hypertension   . Bipolar disorder    Axis IV: other psychosocial or environmental problems, problems related to social environment and problems with primary support group Axis V: 11-20 some danger of hurting self or others possible OR occasionally fails to maintain minimal personal hygiene OR gross impairment in communication  ADL's:  Intact  Sleep: Poor  Appetite:  Fair  Suicidal Ideation:  Denies  Homicidal Ideation:  Denies   Psychiatric Specialty Exam: Physical Exam  Constitutional: He is oriented to person, place, and time. He appears well-developed and well-nourished.  HENT:  Head: Normocephalic and atraumatic.  Neck: Normal range of motion.  Respiratory: Effort normal.  Musculoskeletal: Normal range of motion.  Neurological: He is alert and oriented to person, place, and time.    Review of Systems  Constitutional: Negative.   HENT: Negative.   Eyes: Negative.   Respiratory: Negative.   Cardiovascular: Negative.   Gastrointestinal: Negative.   Genitourinary: Negative.   Musculoskeletal: Negative.   Skin: Negative.   Neurological: Negative.   Endo/Heme/Allergies: Negative.     Blood pressure 108/72, pulse 110, temperature 98.1 F  (36.7 C), temperature source Oral, resp. rate 18, height 5\' 8"  (1.727 m), weight 143 lb (64.864 kg).Body mass index is 21.75 kg/(m^2).  General Appearance: Casual  Eye Contact::  Fair  Speech:  Slightly pressured  Volume:  Normal  Mood:  Anxious  Affect:  Blunt  Thought Process:  Tangential  Orientation:  Full (Time, Place, and Person)  Thought Content:  hyperreligious  Suicidal Thoughts:  No  Homicidal Thoughts:  No  Memory:  Immediate;   Fair Recent;   Fair Remote;   Fair  Judgement:  Impaired  Insight:  Lacking  Psychomotor Activity:  Increased  Concentration:  Fair  Recall:  [employer] of Knowledge:Fair  Language: Good  Akathisia:  No  Handed:  Right  AIMS (if indicated):     Assets:  Leisure Time Physical Health Resilience Social Support  Sleep:  Number of Hours: 3.75   Musculoskeletal: Strength & Muscle Tone: within normal limits Gait & Station: normal Patient leans: N/A  Current Medications: Current Facility-Administered Medications  Medication Dose Route Frequency Provider Last Rate Last Dose  . acetaminophen (TYLENOL) tablet 650 mg  650 mg Oral Q4H PRN Nanine Means, NP      . alum & mag hydroxide-simeth (MAALOX/MYLANTA) 200-200-20 MG/5ML suspension 30 mL  30 mL Oral PRN Nanine Means, NP      . ARIPiprazole (ABILIFY) tablet 10 mg  10 mg Oral QHS Nehemiah Massed, MD   10 mg at 02/22/14 2126  . ARIPiprazole (ABILIFY) tablet 5 mg  5 mg Oral q morning - 10a Nehemiah Massed, MD  5 mg at 02/23/14 0812  . divalproex (DEPAKOTE) DR tablet 500 mg  500 mg Oral TID PC Cleotis NipperSyed T Kord Monette, MD   500 mg at 02/23/14 0811  . feeding supplement (ENSURE COMPLETE) (ENSURE COMPLETE) liquid 237 mL  237 mL Oral TID BM Tenny CrawHeather S Winkler, RD   237 mL at 02/23/14 0817  . hydrochlorothiazide (HYDRODIURIL) tablet 25 mg  25 mg Oral Daily Nanine MeansJamison Lord, NP   25 mg at 02/23/14 0811  . lisinopril (PRINIVIL,ZESTRIL) tablet 5 mg  5 mg Oral Daily Verne SpurrNeil Mashburn, PA-C   5 mg at 02/23/14 16100811  . LORazepam  (ATIVAN) tablet 1 mg  1 mg Oral Q6H PRN Fransisca KaufmannLaura Davis, NP   1 mg at 02/18/14 0759  . magnesium hydroxide (MILK OF MAGNESIA) suspension 30 mL  30 mL Oral Daily PRN Nanine MeansJamison Lord, NP      . OLANZapine zydis (ZYPREXA) disintegrating tablet 10 mg  10 mg Oral Q8H PRN Mojeed Akintayo   10 mg at 02/18/14 0800  . ondansetron (ZOFRAN) tablet 4 mg  4 mg Oral Q8H PRN Nanine MeansJamison Lord, NP      . potassium chloride (K-DUR,KLOR-CON) CR tablet 10 mEq  10 mEq Oral Daily Nanine MeansJamison Lord, NP   10 mEq at 02/23/14 0811  . traZODone (DESYREL) tablet 100 mg  100 mg Oral QHS PRN Fransisca KaufmannLaura Davis, NP        Lab Results:  Results for orders placed during the hospital encounter of 02/16/14 (from the past 48 hour(s))  VALPROIC ACID LEVEL     Status: Abnormal   Collection Time    02/22/14  6:20 AM      Result Value Ref Range   Valproic Acid Lvl 113.4 (*) 50.0 - 100.0 ug/mL   Comment: Performed [facility]    Physical Findings: AIMS: Facial and Oral Movements Muscles of Facial Expression: None, normal Lips and Perioral Area: None, normal Jaw: None, normal Tongue: None, normal,Extremity Movements Upper (arms, wrists, hands, fingers): None, normal Lower (legs, knees, ankles, toes): None, normal, Trunk Movements Neck, shoulders, hips: None, normal, Overall Severity Severity of abnormal movements (highest score from questions above): None, normal Incapacitation due to abnormal movements: None, normal Patient's awareness of abnormal movements (rate only patient's report): No Awareness, Dental Status Current problems with teeth and/or dentures?: No Does patient usually wear dentures?: No  CIWA:    COWS:     Treatment Plan Summary: Daily contact with patient to assess and evaluate symptoms and progress in treatment Medication management  Plan:   Continue current treatment.  Patient is taking moderate dose of Abilify and Depakote.  His Depakote level is 113.  At this time he does any side effects.  Monitor his  behavior, medication efficacy and encouraged to participate in group therapy.  Medical Decision Making Problem Points:  Review of last therapy session (1) Data Points:  Review of medication regiment & side effects (2)  I certify that inpatient services furnished can reasonably be expected to improve the patient's condition.   Eddie Payette T., 02/23/2014, 12:45 PM

## 2014-02-23 NOTE — [facility] Group Notes (Signed)
[facility] LCSW Group Therapy  02/23/2014  1:05 PM  Type of Therapy:  Group therapy  Participation Level:  Active  Participation Quality:  Attentive  Affect:  Flat  Cognitive:  Oriented  Insight:  Limited  Engagement in Therapy:  Limited  Modes of Intervention:  Discussion, Socialization  Summary of Progress/Problems:  Chaplain was here to lead a group on themes of hope and courage.  "Hope is the Word of God.  I turn to it every day."  Per usual, spent much of group shuffling through papers and jotting notes.    [name], [name] B 02/23/2014 1:34 PM

## 2014-02-23 NOTE — Progress Notes (Signed)
Patient ID: Jerry Carter, male   DOB: [birth year], 53 y.o.   MRN: [id number] PER STATE REGULATIONS 482.30  THIS CHART WAS REVIEWED FOR MEDICAL NECESSITY WITH RESPECT TO THE PATIENT'S ADMISSION/DURATION OF STAY.  NEXT REVIEW DATE:02/26/14  Loura HaltBARBARA Lura Falor, RN, BSN CASE MANAGER

## 2014-02-23 NOTE — Progress Notes (Signed)
Patient ID: Jerry Carter, male   DOB: January 18, 1961, 53 y.o.   MRN: [id number] D: Patient pacing up and down talking to self. Pt is hyperviglant and is religious preoccupied.  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 Pt starts every conversation with a bible verse and quotes different scriptures during the conversation. No acute distressed noted at this time.   A: Met with pt 1:1. Medications administered as prescribed. Writer encouraged pt to discuss feelings. Pt encouraged to come to staff with any question or concerns.   R: Patient remains safe. He is complaint with medications and denies any adverse reaction. Continue current POC.

## 2014-02-24 NOTE — Progress Notes (Signed)
Pt has been up and active in the milieu today. He denies any depression, hopelessness or anxiety on his self-inventory. He denies any S/H ideation or A/V/H.  He has been pacing, preaching and writing bible verses down much of the day.  He is religiously preoccupied, manic, hyper-verbal and tangential. He can be directed fairly well.  His goal "getting better" by "living a balanced life".

## 2014-02-24 NOTE — Progress Notes (Signed)
[facility] Group Notes:  (Nursing/MHT/Case Management/Adjunct)  Date:  02/24/2014  Time:  10:10 PM  Type of Therapy:  Psychoeducational Skills  Participation Level:  Active  Participation Quality:  Monopolizing  Affect:  Excited  Cognitive:  Lacking  Insight:  Improving  Engagement in Group:  Distracting  Modes of Intervention:  Education  Summary of Progress/Problems: The patient verbalized that he had a good day overall. The patient spoke very rapidly at times and said that he has been "productive for 7 months". The patient also stated that he was spending his time journaling and trying to put things together. He continues to write quite a bit and carries multiple papers along with a folder. As a theme for the day, his relapse prevention will involve avoiding negative people.   Darnisha Vernet S 02/24/2014, 10:10 PM

## 2014-02-24 NOTE — Progress Notes (Signed)
[facility]  02/24/2014 3:01 PM Jerry NighJames L Soza  MRN:  [id number]  Subjective:  I wanted discharge.  I am taking the medication.      Objective Patient seen and chart reviewed.  The patient is taking his medication as prescribed however he remains very labile, manic and easily irritable.  He denies any side effects of medication.  His sleep is improved from the past.  He remains disorganized and loosely preoccupied .  He believed medication assisting his healing process but he remains in that he has any psychiatric illness.  Patient remains grandiose .  He lives in shelter.  Patient wanted to discharge so that he can work.  Currently he is unemployed.  He is going to the group but he is very disruptive.  He has no tremors or shakes.  Diagnosis:   DSM5:  Total Time spent with patient: 20 minutes  Axis I: Schizoaffective Disorder Axis II: Deferred Axis III:  Past Medical History  Diagnosis Date  . Hypertension   . Bipolar disorder    Axis IV: other psychosocial or environmental problems, problems related to social environment and problems with primary support group Axis V: 11-20 some danger of hurting self or others possible OR occasionally fails to maintain minimal personal hygiene OR gross impairment in communication  ADL's:  Intact  Sleep: Poor  Appetite:  Fair  Suicidal Ideation:  Denies  Homicidal Ideation:  Denies   Psychiatric Specialty Exam: Physical Exam  Constitutional: He is oriented to person, place, and time. He appears well-developed and well-nourished.  HENT:  Head: Normocephalic and atraumatic.  Neck: Normal range of motion.  Respiratory: Effort normal.  Musculoskeletal: Normal range of motion.  Neurological: He is alert and oriented to person, place, and time.    Review of Systems  Constitutional: Negative.   HENT: Negative.   Eyes: Negative.   Respiratory: Negative.   Cardiovascular: Negative.   Gastrointestinal: Negative.   Genitourinary:  Negative.   Musculoskeletal: Negative.   Skin: Negative.   Neurological: Negative.   Endo/Heme/Allergies: Negative.     Blood pressure 102/84, pulse 100, temperature 97.4 F (36.3 C), temperature source Oral, resp. rate 16, height 5\' 8"  (1.727 m), weight 143 lb (64.864 kg).Body mass index is 21.75 kg/(m^2).  General Appearance: Casual  Eye Contact::  Fair  Speech:  Slightly pressured  Volume:  Normal  Mood:  Anxious  Affect:  Blunt  Thought Process:  Tangential  Orientation:  Full (Time, Place, and Person)  Thought Content:  hyperreligious  Suicidal Thoughts:  No  Homicidal Thoughts:  No  Memory:  Immediate;   Fair Recent;   Fair Remote;   Fair  Judgement:  Impaired  Insight:  Lacking  Psychomotor Activity:  Increased  Concentration:  Fair  Recall:  [employer]Fair  Fund of Knowledge:Fair  Language: Good  Akathisia:  No  Handed:  Right  AIMS (if indicated):     Assets:  Leisure Time Physical Health Resilience Social Support  Sleep:  Number of Hours: 4.5   Musculoskeletal: Strength & Muscle Tone: within normal limits Gait & Station: normal Patient leans: N/A  Current Medications: Current Facility-Administered Medications  Medication Dose Route Frequency Provider Last Rate Last Dose  . acetaminophen (TYLENOL) tablet 650 mg  650 mg Oral Q4H PRN Nanine MeansJamison Lord, NP      . alum & mag hydroxide-simeth (MAALOX/MYLANTA) 200-200-20 MG/5ML suspension 30 mL  30 mL Oral PRN Nanine MeansJamison Lord, NP      . ARIPiprazole (ABILIFY) tablet 10  mg  10 mg Oral QHS Nehemiah Massed, MD   10 mg at 02/23/14 2116  . ARIPiprazole (ABILIFY) tablet 5 mg  5 mg Oral q morning - 10a Nehemiah Massed, MD   5 mg at 02/24/14 1000  . divalproex (DEPAKOTE) DR tablet 500 mg  500 mg Oral TID PC Cleotis Nipper, MD   500 mg at 02/24/14 1214  . feeding supplement (ENSURE COMPLETE) (ENSURE COMPLETE) liquid 237 mL  237 mL Oral TID BM Tenny Craw, RD   237 mL at 02/24/14 1252  . hydrochlorothiazide (HYDRODIURIL) tablet 25 mg   25 mg Oral Daily Nanine Means, NP   25 mg at 02/24/14 0817  . lisinopril (PRINIVIL,ZESTRIL) tablet 5 mg  5 mg Oral Daily Verne Spurr, PA-C   5 mg at 02/24/14 0817  . LORazepam (ATIVAN) tablet 1 mg  1 mg Oral Q6H PRN Fransisca Kaufmann, NP   1 mg at 02/18/14 0759  . magnesium hydroxide (MILK OF MAGNESIA) suspension 30 mL  30 mL Oral Daily PRN Nanine Means, NP      . OLANZapine zydis (ZYPREXA) disintegrating tablet 10 mg  10 mg Oral Q8H PRN Mojeed Akintayo   10 mg at 02/18/14 0800  . ondansetron (ZOFRAN) tablet 4 mg  4 mg Oral Q8H PRN Nanine Means, NP      . potassium chloride (K-DUR,KLOR-CON) CR tablet 10 mEq  10 mEq Oral Daily Nanine Means, NP   10 mEq at 02/24/14 0817  . traZODone (DESYREL) tablet 100 mg  100 mg Oral QHS PRN Fransisca Kaufmann, NP        Lab Results:  No results found for this or any previous visit (from the past 48 hour(s)).  Physical Findings: AIMS: Facial and Oral Movements Muscles of Facial Expression: None, normal Lips and Perioral Area: None, normal Jaw: None, normal Tongue: None, normal,Extremity Movements Upper (arms, wrists, hands, fingers): None, normal Lower (legs, knees, ankles, toes): None, normal, Trunk Movements Neck, shoulders, hips: None, normal, Overall Severity Severity of abnormal movements (highest score from questions above): None, normal Incapacitation due to abnormal movements: None, normal Patient's awareness of abnormal movements (rate only patient's report): No Awareness, Dental Status Current problems with teeth and/or dentures?: No Does patient usually wear dentures?: No  CIWA:    COWS:     Treatment Plan Summary: Daily contact with patient to assess and evaluate symptoms and progress in treatment Medication management  Plan:   Continue current treatment.  Patient is taking high dose of Abilify and Depakote.  He is talking his medication and denies any side effects.  We will monitor his behavior, medication efficacy and encouraged to participate  in group therapy.  Medical Decision Making Problem Points:  Review of last therapy session (1) Data Points:  Review of medication regiment & side effects (2)  I certify that inpatient services furnished can reasonably be expected to improve the patient's condition.   Keysha Damewood T., 02/24/2014, 3:01 PM

## 2014-02-24 NOTE — [facility] Group Notes (Signed)
[facility] Group Notes:  (Nursing/MHT/Case Management/Adjunct)  Date:  02/24/2014  Time:  10:41 AM  Type of Therapy:  Nurse Education  Participation Level:  Active  Participation Quality:  Intrusive, Inattentive and Monopolizing  Affect:  Anxious and Excited  Cognitive:  Lacking  Insight:  Limited  Engagement in Group:  Distracting, Monopolizing and Poor  Modes of Intervention:  Discussion, Education and Limit-setting  Summary of Progress/Problems: Pt did attend but was all over the place tangential and religiously preoccupied.  He could be redirected at times.  Jule SerKent, Quang Thorpe Gail 02/24/2014, 10:41 AM

## 2014-02-24 NOTE — [facility] Group Notes (Signed)
[facility] Group Notes:  (Clinical Social Work)  02/24/2014  11:00-11:45AM  Summary of Progress/Problems:   The main focus of today's process group was for the patient to identify ways in which they have in the past sabotaged their own recovery and reasons they may have done this/what they received from doing it.  We then worked to identify a specific plan to avoid doing this when discharged from the hospital for this admission.  The patient expressed that he has spoken at length with his pastor [facility], and that he realizes now he was only looking at part of the puzzle earlier, and feels he has more pieces of the puzzle now.  He said that he now understands that medication is an important part of the puzzle, and that he needs to go for his injections every 2 weeks regularly.  He asked for suggestions about how to make sure he accomplishes this, and was given a number of suggestions by other group members, including get a calendar and put it on there, have a friend call (and another group member volunteered to do so), use the alarm on his phone, use sticky notes.  He was religiously-preoccupied, and bragged about a large number of papers that he displayed as his work while he has been in the hospital.  He sang several songs with another group member, said he wants to take voice lessons, music lessons, learn everything that he can learn when he gets out of the hospital.  He talked at length, and would not let anyone interrupt him, comparing being in the hospital to [place] being in [place].  Type of Therapy:  Group Therapy - Process  Participation Level:  Active  Participation Quality:  Attentive, Monopolizing, Sharing and Supportive  Affect:  Blunted  Cognitive:  Disorganized  Insight:  Limited  Engagement in Therapy:  Distracting  Modes of Intervention:  Motivational Interviewing, Discussion  Ambrose MantleMareida Grossman-Orr, LCSW 02/24/2014, 12:11 PM

## 2014-02-24 NOTE — Progress Notes (Signed)
D. Pt has been up and has been active in milieu throughout the evening. Pt is religiously preoccupied, hyperverbal and often intrusive but also very pleasant and cordial with his interactions with staff and peers. Pt spoke about how he had a good day today and spoke about how he has been doing a lot of writing today. Pt has received all medications without incident. A. Support and encouragement provided. R. Safety maintained, will continue to monitor.

## 2014-02-25 NOTE — Progress Notes (Signed)
Pt has been religiously preoccupied today.  He did not pray over his morning medications and only said a short prayer over his 1300 medication.  He denies any depression or anxiety on his self-inventory. He denies any A/V/H.  He has to be redirected often but he does follow the redirection.  He has been writing in many small blue journals [facility] and several loose papers that he keeps fumbling through and referring to. He has not required any prn meds thus far.

## 2014-02-25 NOTE — [facility] Group Notes (Signed)
[facility] Group Notes:  (Nursing/MHT/Case Management/Adjunct)  Date:  02/25/2014  Time:  11:43 AM  Type of Therapy:  Psychoeducational Skills  Participation Level:  Active  Participation Quality:  Appropriate  Affect:  Anxious and Excited  Cognitive:  Delusional and Lacking  Insight:  Lacking and Limited  Engagement in Group:  Engaged and Limited  Modes of Intervention:  Activity, Discussion and Education  Summary of Progress/Problems:  Jerry Carter, Jerry Carter 02/25/2014, 11:43 AM

## 2014-02-25 NOTE — Progress Notes (Signed)
Took over Pt's care at 2330, resting in bed with even and unlabored respirations.  Will continue to monitor.

## 2014-02-25 NOTE — Progress Notes (Signed)
[facility] Group Notes:  (Nursing/MHT/Case Management/Adjunct)  Date:  02/25/2014  Time:  10:36 PM  Type of Therapy:  Psychoeducational Skills  Participation Level:  Active  Participation Quality:  Intrusive  Affect:  Excited  Cognitive:  Lacking  Insight:  Limited  Engagement in Group:  Distracting and Off Topic  Modes of Intervention:  Education  Summary of Progress/Problems: The spoke in generalities in group. The patient states that he was more "focused" and had "more clarity" but could not explain any further. He continues to write in his journal book and is hyper verbal with his speech. In terms of the theme for the day, his support system will consist of his church.   Hazle CocaGOODMAN, Keenen Roessner S 02/25/2014, 10:36 PM

## 2014-02-25 NOTE — [facility] Group Notes (Signed)
[facility] Group Notes:  (Clinical Social Work)  02/25/2014   11:15am-12:00pm  Summary of Progress/Problems:  The main focus of today's process group was to listen to a variety of genres of music and to identify that different types of music provoke different responses.  The patient then was able to identify personally what was soothing for them, as well as energizing.  Handouts were used to record feelings evoked, as well as how patient can personally use this knowledge in sleep habits, with depression, and with other symptoms.  The patient expressed understanding of concepts, as well as knowledge of how each type of music affected him/her and how this can be used at home as a wellness/recovery tool.  Type of Therapy:  Music Therapy   Participation Level:  Active  Participation Quality:  Attentive and Sharing  Affect:  Blunted and Drowsy  Cognitive:  Oriented  Insight:  Engaged  Engagement in Therapy:  Engaged  Modes of Intervention:   Activity, Exploration  Ambrose MantleMareida Grossman-Orr, LCSW 02/25/2014, 12:30pm

## 2014-02-25 NOTE — [facility] Group Notes (Signed)
[facility] Group Notes:  (Nursing/MHT/Case Management/Adjunct)  Date:  02/25/2014  Time:  11:41 AM  Type of Therapy:  Nurse Education  Participation Level:  Active  Participation Quality:  Appropriate  Affect:  Labile  Cognitive:  Disorganized, Delusional and Lacking  Insight:  Limited  Engagement in Group:  Engaged and Limited  Modes of Intervention:  Activity, Discussion and Education  Summary of Progress/Problems:  Jerry Carter, Jerry Carter 02/25/2014, 11:41 AM

## 2014-02-25 NOTE — Progress Notes (Signed)
D. Pt has been up and visible in milieu this evening, still presents as hyperverbal and intrusive. Pt religiously preoccupied and has been writing in his journal throughout the day and working on his various "plans". Pt is pleasant in his interactions with staff and peers and has received all medications without incident. A. Support and encouragement provided. R. Safety maintained, will continue to monitor.

## 2014-02-25 NOTE — Progress Notes (Signed)
[facility] MD Progress Note  02/25/2014 12:36 PM Jerry Carter  MRN:  [id number]  Subjective:  I am sleeping better.  I am taking the medication.      Objective Patient seen and chart reviewed.  Patient means hypomanic, labile and restless however he is taking his medication without any problem.  His sleep is improved from the past.  Her thinking remains disorganized and grandiose.  He is going to the groups however he does not stays in his group sometime.  He has no tremors or shakes.  His insight remains limited about her psychiatric illness .  He remains religiously preoccupied and believes that medicine has natural healing power and God is helping him to get better. He has no tremors or shakes.  Diagnosis:   DSM5:  Total Time spent with patient: 20 minutes  Axis I: Schizoaffective Disorder Axis II: Deferred Axis III:  Past Medical History  Diagnosis Date  . Hypertension   . Bipolar disorder    Axis IV: other psychosocial or environmental problems, problems related to social environment and problems with primary support group Axis V: 11-20 some danger of hurting self or others possible OR occasionally fails to maintain minimal personal hygiene OR gross impairment in communication  ADL's:  Intact  Sleep: Poor  Appetite:  Fair  Suicidal Ideation:  Denies  Homicidal Ideation:  Denies   Psychiatric Specialty Exam: Physical Exam  Constitutional: He is oriented to person, place, and time. He appears well-developed and well-nourished.  HENT:  Head: Normocephalic and atraumatic.  Neck: Normal range of motion.  Respiratory: Effort normal.  Musculoskeletal: Normal range of motion.  Neurological: He is alert and oriented to person, place, and time.    Review of Systems  Constitutional: Negative.   HENT: Negative.   Eyes: Negative.   Respiratory: Negative.   Cardiovascular: Negative.   Gastrointestinal: Negative.   Genitourinary: Negative.   Musculoskeletal: Negative.   Skin:  Negative.   Neurological: Negative.   Endo/Heme/Allergies: Negative.   Psychiatric/Behavioral: Positive for hallucinations.       Restless    Blood pressure 110/78, pulse 109, temperature 97.5 F (36.4 C), temperature source Oral, resp. rate 16, height 5\' 8"  (1.727 m), weight 143 lb (64.864 kg).Body mass index is 21.75 kg/(m^2).  General Appearance: Casual  Eye Contact::  Fair  Speech:  Slightly pressured  Volume:  Normal  Mood:  Anxious  Affect:  Blunt  Thought Process:  Tangential  Orientation:  Full (Time, Place, and Person)  Thought Content:  hyperreligious  Suicidal Thoughts:  No  Homicidal Thoughts:  No  Memory:  Immediate;   Fair Recent;   Fair Remote;   Fair  Judgement:  Impaired  Insight:  Lacking  Psychomotor Activity:  Increased  Concentration:  Fair  Recall:  [employer] of Knowledge:Fair  Language: Good  Akathisia:  No  Handed:  Right  AIMS (if indicated):     Assets:  Leisure Time Physical Health Resilience Social Support  Sleep:  Number of Hours: 4.75   Musculoskeletal: Strength & Muscle Tone: within normal limits Gait & Station: normal Patient leans: N/A  Current Medications: Current Facility-Administered Medications  Medication Dose Route Frequency Provider Last Rate Last Dose  . acetaminophen (TYLENOL) tablet 650 mg  650 mg Oral Q4H PRN Nanine Means, NP      . alum & mag hydroxide-simeth (MAALOX/MYLANTA) 200-200-20 MG/5ML suspension 30 mL  30 mL Oral PRN Nanine Means, NP      . ARIPiprazole (ABILIFY) tablet 10  mg  10 mg Oral QHS Nehemiah MassedFernando Cobos, MD   10 mg at 02/24/14 2106  . ARIPiprazole (ABILIFY) tablet 5 mg  5 mg Oral q morning - 10a Nehemiah MassedFernando Cobos, MD   5 mg at 02/25/14 0736  . divalproex (DEPAKOTE) DR tablet 500 mg  500 mg Oral TID PC Cleotis NipperSyed T Jammie Troup, MD   500 mg at 02/25/14 0737  . feeding supplement (ENSURE COMPLETE) (ENSURE COMPLETE) liquid 237 mL  237 mL Oral TID BM Tenny CrawHeather S Winkler, RD   237 mL at 02/25/14 0739  . hydrochlorothiazide  (HYDRODIURIL) tablet 25 mg  25 mg Oral Daily Nanine MeansJamison Lord, NP   25 mg at 02/25/14 0737  . lisinopril (PRINIVIL,ZESTRIL) tablet 5 mg  5 mg Oral Daily Verne SpurrNeil Mashburn, PA-C   5 mg at 02/25/14 0737  . LORazepam (ATIVAN) tablet 1 mg  1 mg Oral Q6H PRN Fransisca KaufmannLaura Davis, NP   1 mg at 02/18/14 0759  . magnesium hydroxide (MILK OF MAGNESIA) suspension 30 mL  30 mL Oral Daily PRN Nanine MeansJamison Lord, NP      . OLANZapine zydis (ZYPREXA) disintegrating tablet 10 mg  10 mg Oral Q8H PRN Mojeed Akintayo   10 mg at 02/18/14 0800  . ondansetron (ZOFRAN) tablet 4 mg  4 mg Oral Q8H PRN Nanine MeansJamison Lord, NP      . potassium chloride (K-DUR,KLOR-CON) CR tablet 10 mEq  10 mEq Oral Daily Nanine MeansJamison Lord, NP   10 mEq at 02/25/14 0737  . traZODone (DESYREL) tablet 100 mg  100 mg Oral QHS PRN Fransisca KaufmannLaura Davis, NP        Lab Results:  No results found for this or any previous visit (from the past 48 hour(s)).  Physical Findings: AIMS: Facial and Oral Movements Muscles of Facial Expression: None, normal Lips and Perioral Area: None, normal Jaw: None, normal Tongue: None, normal,Extremity Movements Upper (arms, wrists, hands, fingers): None, normal Lower (legs, knees, ankles, toes): None, normal, Trunk Movements Neck, shoulders, hips: None, normal, Overall Severity Severity of abnormal movements (highest score from questions above): None, normal Incapacitation due to abnormal movements: None, normal Patient's awareness of abnormal movements (rate only patient's report): No Awareness, Dental Status Current problems with teeth and/or dentures?: No Does patient usually wear dentures?: No  CIWA:    COWS:     Treatment Plan Summary: Daily contact with patient to assess and evaluate symptoms and progress in treatment Medication management  Plan:   Patient is showing improvement from the past.  Continue Abilify and Depakote her present dose.  Encouraged to participate in group milieu therapy .   We will monitor his behavior and medication  efficacy.    Medical Decision Making Problem Points:  Review of last therapy session (1) Data Points:  Review of medication regiment & side effects (2)  I certify that inpatient services furnished can reasonably be expected to improve the patient's condition.   Duane Earnshaw T., 02/25/2014, 12:36 PM

## 2014-02-25 NOTE — [facility] Group Notes (Signed)
[facility] Group Notes:  (Nursing/MHT/Case Management/Adjunct)  Date:  02/25/2014  Time:  9:27 AM  Type of Therapy:  Nurse Education  Participation Level:  Active  Participation Quality:  Intrusive, Monopolizing and Redirectable  Affect:  Excited and Labile  Cognitive:  Delusional and Lacking  Insight:  Lacking and Limited  Engagement in Group:  Distracting, Lacking and Monopolizing  Modes of Intervention:  Activity, Education and Exploration  Summary of Progress/Problems:Pt did attend but was all over the place tangential and religiously preoccupied. He could be redirected at times.   Jule SerKent, Gianno Volner Gail 02/25/2014, 9:27 AM

## 2014-02-26 DIAGNOSIS — F319 Bipolar disorder, unspecified: Secondary | ICD-10-CM

## 2014-02-26 DIAGNOSIS — I10 Essential (primary) hypertension: Secondary | ICD-10-CM

## 2014-02-26 MED ORDER — TRAZODONE HCL 100 MG PO TABS
100.0000 mg | ORAL_TABLET | Freq: Every day | ORAL | Status: DC
  Administered 2014-02-26 – 2014-02-27 (×2): 100 mg via ORAL
  Filled 2014-02-26 (×4): qty 1

## 2014-02-26 MED ORDER — DIVALPROEX SODIUM 500 MG PO DR TAB
500.0000 mg | DELAYED_RELEASE_TABLET | Freq: Two times a day (BID) | ORAL | Status: DC
  Administered 2014-02-26 – 2014-02-27 (×2): 500 mg via ORAL
  Filled 2014-02-26 (×4): qty 1

## 2014-02-26 MED ORDER — LISINOPRIL 10 MG PO TABS
10.0000 mg | ORAL_TABLET | Freq: Every day | ORAL | Status: DC
  Administered 2014-02-27 – 2014-03-08 (×10): 10 mg via ORAL
  Filled 2014-02-26 (×4): qty 1
  Filled 2014-02-26 (×2): qty 14
  Filled 2014-02-26 (×7): qty 1

## 2014-02-26 MED ORDER — LISINOPRIL 5 MG PO TABS
5.0000 mg | ORAL_TABLET | Freq: Once | ORAL | Status: AC
  Administered 2014-02-26: 5 mg via ORAL
  Filled 2014-02-26: qty 1

## 2014-02-26 MED ORDER — ARIPIPRAZOLE 10 MG PO TABS
10.0000 mg | ORAL_TABLET | Freq: Two times a day (BID) | ORAL | Status: DC
  Administered 2014-02-26 – 2014-02-28 (×4): 10 mg via ORAL
  Filled 2014-02-26 (×9): qty 1

## 2014-02-26 NOTE — Progress Notes (Signed)
Patient ID: Jerry Carter, male   DOB: [birth year], 53 y.o.   MRN: [id number] PER STATE REGULATIONS 482.30  THIS CHART WAS REVIEWED FOR MEDICAL NECESSITY WITH RESPECT TO THE PATIENT'S ADMISSION/DURATION OF STAY.  NEXT REVIEW DATE: 03/01/14  Loura HaltBARBARA Krystofer Hevener, RN, BSN CASE MANAGER

## 2014-02-26 NOTE — Progress Notes (Signed)
Adult Psychoeducational Group Note  Date:  02/26/2014 Time:  10:23 PM  Group Topic/Focus:  Wrap-Up Group:   The focus of this group is to help patients review their daily goal of treatment and discuss progress on daily workbooks.  Participation Level:  Active  Participation Quality:  Appropriate  Affect:  Excited  Cognitive:  Oriented  Insight: Limited  Engagement in Group:  Engaged and Improving  Modes of Intervention:  Socialization and Support  Additional Comments:  Patient attended and participated in group tonight. 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 Today he also played basketball outside. He had his meals, socialized and watched television. Wellness to him means total holliness.  Lita MainsFrancis, [place] Greeson [facility] 02/26/2014, 10:23 PM

## 2014-02-26 NOTE — Consult Note (Signed)
[facility] Medical Consultation  Jerry Carter WUJ:[id number] DOB: 1960/11/13 DOA: 02/16/2014 PCP: Thea Silversmith   Requesting physician: Marella Bile Date of consultation: 02/26/14 Reason for consultation: uncontrolled HTN  Impression/Recommendations  Uncontrolled HTN: agree with HCTZ. Recommend uptitration of lisinopril and monitoring BMET. Have ordered. Will follow.   HPI:  53 year old homeless male with history of untreated hypertension admitted to behavioral health hospital for psychosis related to schizoaffective disorder, bipolar type. He has been on antihypertensives in the past but cannot recall which. He has been at [facility] for about 11 days. Blood pressure has been variable but today was noted to be 156/108. Yesterday, within normal limits. Heart rate has ranged in the 40s to the 118 range. He has no chest pain or shortness of breath.  Review of Systems:  Systems reviewed. As above otherwise negative  Past Medical History  Diagnosis Date  . Hypertension   . Bipolar disorder    History reviewed. No pertinent past surgical history. Social History:  reports that he has never smoked. He does not have any smokeless tobacco history on file. He reports that he does not drink alcohol or use illicit drugs.  No Known Allergies FH: HTN  Prior to Admission medications   Medication Sig Start Date End Date Taking? Authorizing Provider  ARIPiprazole (ABILIFY) 5 MG tablet Take 1 tablet (5 mg total) by mouth 2 (two) times daily after a meal. 02/20/14   Verne Spurr, PA-C  divalproex (DEPAKOTE) 500 MG DR tablet Take 1 tablet (500 mg total) by mouth 3 (three) times daily after meals. 02/20/14   Verne Spurr, PA-C  lisinopril (PRINIVIL,ZESTRIL) 5 MG tablet Take 1 tablet (5 mg total) by mouth daily. 02/20/14   Verne Spurr, PA-C  traZODone (DESYREL) 100 MG tablet Take 1 tablet (100 mg total) by mouth at bedtime as needed for sleep. 02/20/14   Verne Spurr, PA-C   Physical Exam: Blood pressure  156/108, pulse 118, temperature 98.3 F (36.8 C), temperature source Oral, resp. rate 15, height 5\' 8"  (1.727 m), weight 64.864 kg (143 lb). Filed Vitals:   02/26/14 1025  BP: 156/108  Pulse: 118  Temp:   Resp:    BP 156/108  Pulse 118  Temp(Src) 98.3 F (36.8 C) (Oral)  Resp 15  Ht 5\' 8"  (1.727 m)  Wt 64.864 kg (143 lb)  BMI 21.75 kg/m2  General Appearance:    Alert, cooperative, thin AA male  Head:    Normocephalic, without obvious abnormality, atraumatic  Eyes:    PERRL, conjunctiva/corneas clear, EOM's intact, fundi    benign, both eyes          Nose:   Nares normal, septum midline, mucosa normal, no drainage   or sinus tenderness  Throat:   Lips, mucosa, and tongue normal; teeth and gums normal  Neck:   Supple, symmetrical, trachea midline, no adenopathy;       thyroid:  No enlargement/tenderness/nodules; no carotid   bruit or JVD  Back:     Symmetric, no curvature, ROM normal, no CVA tenderness  Lungs:     Clear to auscultation bilaterally, respirations unlabored  Chest wall:    No tenderness or deformity  Heart:    Regular rate and rhythm, S1 and S2 normal, no murmur, rub   or gallop  Abdomen:     Soft, non-tender, bowel sounds active all four quadrants,    no masses, no organomegaly        Extremities:   Extremities normal, atraumatic, no cyanosis or edema  Pulses:   2+ and symmetric all extremities  Skin:   Skin color, texture, turgor normal, no rashes or lesions  Lymph nodes:   Cervical, supraclavicular, and axillary nodes normal  Neurologic:   CNII-XII intact. Normal strength, sensation and reflexes      throughout    Psychiatric: cooperative. Speech pressured. Tangential.  Labs on Admission:  Results for Ronelle NighCANADY, Jaymason L (MRN [id number]) as of 02/26/2014 15:12  Ref. Range 02/14/2014 13:02 02/16/2014 02:37 02/16/2014 02:42 02/22/2014 06:20  Sodium Latest Range: 137-147 mEq/L 142 141    Potassium Latest Range: 3.7-5.3 mEq/L 3.4 (L) 3.2 (L)    Chloride Latest  Range: 96-112 mEq/L 103 103    CO2 Latest Range: 19-32 mEq/L 25 28    BUN Latest Range: 6-23 mg/dL 18 13    Creatinine Latest Range: 0.50-1.35 mg/dL 6.961.15 2.951.10    Calcium Latest Range: 8.4-10.5 mg/dL 8.9 9.1    GFR calc non Af Amer Latest Range: >90 mL/min 70 (L) 74 (L)    GFR calc Af Amer Latest Range: >90 mL/min 82 (L) 86 (L)    Glucose Latest Range: 70-99 mg/dL 284110 (H) 84    Anion gap Latest Range: 5-15  14 10     Alkaline Phosphatase Latest Range: 39-117 U/L 64 49    Albumin Latest Range: 3.5-5.2 g/dL 3.6 3.8    AST Latest Range: 0-37 U/L 51 (H) 45 (H)    ALT Latest Range: 0-53 U/L 29 27    Total Protein Latest Range: 6.0-8.3 g/dL 6.6 6.9    Total Bilirubin Latest Range: 0.3-1.2 mg/dL 0.4 0.6    WBC Latest Range: 4.0-10.5 K/uL 3.7 (L) 3.5 (L)    RBC Latest Range: 4.22-5.81 MIL/uL 4.33 4.63    Hemoglobin Latest Range: 13.0-17.0 g/dL 13.211.7 (L) 44.012.9 (L)    HCT Latest Range: 39.0-52.0 % 36.3 (L) 38.7 (L)    MCV Latest Range: 78.0-100.0 fL 83.8 83.6    MCH Latest Range: 26.0-34.0 pg 27.0 27.9    MCHC Latest Range: 30.0-36.0 g/dL 10.232.2 72.533.3    RDW Latest Range: 11.5-15.5 % 13.0 13.0    Platelets Latest Range: 150-400 K/uL 138 (L) 134 (L)    Salicylate Lvl Latest Range: 2.8-20.0 mg/dL <3.6<2.0 (L) <6.4<2.0 (L)    Valproic Acid Lvl Latest Range: 50.0-100.0 ug/mL    113.4 (H)  Acetaminophen (Tylenol), Serum Latest Range: 10-30 ug/mL <15.0 <15.0    Alcohol, Ethyl (B) Latest Range: 0-11 mg/dL <40<11 <34<11    Amphetamines Latest Range: NONE DETECTED    NONE DETECTED   Barbiturates Latest Range: NONE DETECTED    NONE DETECTED   Benzodiazepines Latest Range: NONE DETECTED    NONE DETECTED   Opiates Latest Range: NONE DETECTED    NONE DETECTED   COCAINE Latest Range: NONE DETECTED    NONE DETECTED   Tetrahydrocannabinol Latest Range: NONE DETECTED    NONE DETECTED    EKG: Sinus bradycardia Ventricular premature complex Abnormal R-wave progression, early transition   Shyasia Funches L [facility] Pager [phone]  If 7PM-7AM, please contact night-coverage [email or web address] Password [email or web address] 02/26/2014, 3:11 PM

## 2014-02-26 NOTE — Progress Notes (Signed)
[facility]  02/26/2014 1:56 PM Jerry Carter  MRN:  [id number]  Subjective:  "I am fine ,I am able to summarize my life and set some goals."     Objective Patient seen and chart reviewed.  Patient reports he slept well. He continues to be tangential, disorganized but improving. His speech continues to be pushed and pressured. Continues to be hyper religious. He is going to the groups and participating. He denies any new concerns or side effects. .He has no tremors or shakes.      Diagnosis:   DSM5: Primary Psychiatric Diagnosis: Schizoaffective Disorder ,Bipolar type,multiple episodes,now in acute episode  Non psychiatric diagnosis: Hypertension  Total Time spent with patient: 20 minutes    ADL's:  Intact  Sleep: Poor  Appetite:  Fair  Suicidal Ideation:  Denies  Homicidal Ideation:  Denies   Psychiatric Specialty Exam: Physical Exam  Constitutional: He is oriented to person, place, and time. He appears well-developed and well-nourished.  HENT:  Head: Normocephalic and atraumatic.  Neck: Normal range of motion.  Respiratory: Effort normal.  Musculoskeletal: Normal range of motion.  Neurological: He is alert and oriented to person, place, and time.    Review of Systems  Constitutional: Negative.   HENT: Negative.   Eyes: Negative.   Respiratory: Negative.   Cardiovascular: Negative.   Gastrointestinal: Negative.   Genitourinary: Negative.   Musculoskeletal: Negative.   Skin: Negative.   Neurological: Negative.   Endo/Heme/Allergies: Negative.   Psychiatric/Behavioral: The patient has insomnia.        Restless    Blood pressure 156/108, pulse 118, temperature 98.3 F (36.8 C), temperature source Oral, resp. rate 15, height 5\' 8"  (1.727 m), weight 64.864 kg (143 lb).Body mass index is 21.75 kg/(m^2).  General Appearance: Casual  Eye Contact::  Fair  Speech:  pressured  Volume:  Normal  Mood:  Anxious improving  Affect:  Blunt  Thought Process:   Tangential  Orientation:  Full (Time, Place, and Person)  Thought Content:  hyperreligious  Suicidal Thoughts:  No  Homicidal Thoughts:  No  Memory:  Immediate;   Fair Recent;   Fair Remote;   Fair  Judgement:  Impaired  Insight:  Lacking  Psychomotor Activity:  Increased  Concentration:  Fair  Recall:  [employer]Fair  Fund of Knowledge:Fair  Language: Good  Akathisia:  No  Handed:  Right  AIMS (if indicated):   0 -none  Assets:  Leisure Time Physical Health Resilience Social Support  Sleep:  Number of Hours: 2   Musculoskeletal: Strength & Muscle Tone: within normal limits Gait & Station: normal Patient leans: N/A  Current Medications: Current Facility-Administered Medications  Medication Dose Route Frequency Provider Last Rate Last Dose  . acetaminophen (TYLENOL) tablet 650 mg  650 mg Oral Q4H PRN Nanine MeansJamison Lord, NP      . alum & mag hydroxide-simeth (MAALOX/MYLANTA) 200-200-20 MG/5ML suspension 30 mL  30 mL Oral PRN Nanine MeansJamison Lord, NP      . ARIPiprazole (ABILIFY) tablet 10 mg  10 mg Oral BID PC Mojeed Akintayo      . divalproex (DEPAKOTE) DR tablet 500 mg  500 mg Oral BID Mojeed Akintayo      . feeding supplement (ENSURE COMPLETE) (ENSURE COMPLETE) liquid 237 mL  237 mL Oral TID BM Tenny CrawHeather S Winkler, RD   237 mL at 02/26/14 1014  . hydrochlorothiazide (HYDRODIURIL) tablet 25 mg  25 mg Oral Daily Nanine MeansJamison Lord, NP   25 mg at 02/26/14 [id number]  . [  START ON 02/27/2014] lisinopril (PRINIVIL,ZESTRIL) tablet 10 mg  10 mg Oral Daily Christiane Ha, MD      . LORazepam (ATIVAN) tablet 1 mg  1 mg Oral Q6H PRN Fransisca Kaufmann, NP   1 mg at 02/18/14 0759  . magnesium hydroxide (MILK OF MAGNESIA) suspension 30 mL  30 mL Oral Daily PRN Nanine Means, NP      . OLANZapine zydis (ZYPREXA) disintegrating tablet 10 mg  10 mg Oral Q8H PRN Mojeed Akintayo   10 mg at 02/18/14 0800  . ondansetron (ZOFRAN) tablet 4 mg  4 mg Oral Q8H PRN Nanine Means, NP      . potassium chloride (K-DUR,KLOR-CON) CR tablet 10  mEq  10 mEq Oral Daily Nanine Means, NP   10 mEq at 02/26/14 0814  . traZODone (DESYREL) tablet 100 mg  100 mg Oral QHS Jomarie Longs, MD        Lab Results:  No results found for this or any previous visit (from the past 48 hour(s)).  Physical Findings: AIMS: Facial and Oral Movements Muscles of Facial Expression: None, normal Lips and Perioral Area: None, normal Jaw: None, normal Tongue: None, normal,Extremity Movements Upper (arms, wrists, hands, fingers): None, normal Lower (legs, knees, ankles, toes): None, normal, Trunk Movements Neck, shoulders, hips: None, normal, Overall Severity Severity of abnormal movements (highest score from questions above): None, normal Incapacitation due to abnormal movements: None, normal Patient's awareness of abnormal movements (rate only patient's report): No Awareness, Dental Status Current problems with teeth and/or dentures?: No Does patient usually wear dentures?: No  CIWA:    COWS:     Treatment Plan Summary: Daily contact with patient to assess and evaluate symptoms and progress in treatment Medication management  Assessment and Plan:    For psychosis will increase Abilify 10 mg po bid. AIMS - 0 For mood lability will continue Depakote ,but will reduce the dose secondary to abnormal cbc (low platelets). Will repeat CBC in 2 days . Will make available prns for agitation. Will schedule trazodone 100mg  at bedtime for sleep since his sleep has been poor. Encouraged to participate in group milieu therapy .   We will monitor his behavior and medication efficacy.    Reviewed labs -will repeat cbc on Wednesday (august 12)  Medical Decision Making Problem Points:  Review of last therapy session (1) Data Points:  Review of medication regiment & side effects (2)  I certify that inpatient services furnished can reasonably be expected to improve the patient's condition.   Takeria Marquina, 02/26/2014, 1:56 PM

## 2014-02-26 NOTE — [facility] Group Notes (Signed)
[facility] LCSW Group Therapy  02/26/2014 1:15 pm  Type of Therapy: Process Group Therapy  Participation Level:  Active  Participation Quality:  Appropriate  Affect:  Flat  Cognitive:  Oriented  Insight:  Improving  Engagement in Group:  Limited  Engagement in Therapy:  Limited  Modes of Intervention:  Activity, Clarification, Education, Problem-solving and Support  Summary of Progress/Problems: Today's group addressed the issue of overcoming obstacles.  Patients were asked to identify their biggest obstacle post d/c that stands in the way of their on-going success, and then problem solve as to how to manage this.  Fayrene FearingJames states that his biggest obstacle is "KISS."  Keep it Simple, Stupid.  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  Now I am writing too much, and it is too complicated.  I need to simplify."  Pt demonstrates surprising insight into how he will know when he will be doing well enough to succeed outside of here.  [name], Aubry Rankin B 02/26/2014   3:35 PM

## 2014-02-26 NOTE — Progress Notes (Signed)
Patient ID: Jerry Carter, male   DOB: January 28, 1961, 53 y.o.   MRN: [id number] D: Client visible in dayroom writing and fumbling through papers, reports no problems, but sings "It is well", laughs inappropriately, religiously preoccupied, tangential, but none threatening. A: Writer introduced self to client, reviewed medications, administered as schedule. Staff will monitor q8215min for safety. R: Client is safe on the unit, attends group.

## 2014-02-26 NOTE — Progress Notes (Signed)
Patient ID: Jerry Carter, male   DOB: May 21, 1961, 53 y.o.   MRN: [id number]  D: Pt. Denies SI/HI and A/V Hallucinations. Patient does not report any pain or discomfort at this time. Patient rates his depression, hopelessness, and anxiety at 0/10 for the day. Patient remains hyper-religious and hyper-verbal but pleasant. Patient can be directed with help. Patient reports sleeping well, having a good appetite and his concentration is good as well.   A: Support and encouragement provided to the patient. Scheduled medications administered to patient per physician's orders.  R: Patient is receptive and cooperative. Patient is seen in the milieu and is attending groups. Patient reports that his goal is meeting with Rod the CSW and his financial advisor. Q15 minute checks are maintained for safety.

## 2014-02-26 NOTE — [facility] Group Notes (Signed)
[facility]   02/26/2014 10:53 AM  Participation Quality:  Engaged  Mood/Affect:  Excited  Depression Rating:  denies  Anxiety Rating:  denies  Thoughts of Suicide:  No Will you contract for safety?   NA  Current AVH:  No  Plan for Discharge/Comments:  Jerry Carter presents much the same as last week.  He is still carrying around papers and wants to share his goals with me.  They have not changed.  His speech is still pressured and he is still religiously preoccupied.  He agrees that we can call an elder in the church today.  Hopefully they know him well enough to let me know if he is at baseline or not.  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  Transportation Means: bus  Supports: church  [place], Jerry Carter HillsRodney B

## 2014-02-26 NOTE — Progress Notes (Signed)
Took over Pt's care at 2330.  Pt resting in bed with eyes closed, respirations even and unlabored.  No distress noted.  Will continue to monitor., Pt remains safe on unit. 

## 2014-02-27 LAB — BASIC METABOLIC PANEL
Anion gap: 12 (ref 5–15)
BUN: 21 mg/dL (ref 6–23)
CHLORIDE: 100 meq/L (ref 96–112)
CO2: 28 meq/L (ref 19–32)
CREATININE: 0.99 mg/dL (ref 0.50–1.35)
Calcium: 9.5 mg/dL (ref 8.4–10.5)
GFR calc Af Amer: >90 mL/min (ref >90)
GFR calc non Af Amer: >90 mL/min (ref >90)
GLUCOSE: 99 mg/dL (ref 70–99)
Potassium: 4.3 mEq/L (ref 3.7–5.3)
Sodium: 140 mEq/L (ref 137–147)

## 2014-02-27 MED ORDER — HYDRALAZINE HCL 10 MG PO TABS
10.0000 mg | ORAL_TABLET | Freq: Three times a day (TID) | ORAL | Status: DC
  Administered 2014-02-27 – 2014-03-08 (×27): 10 mg via ORAL
  Filled 2014-02-27 (×35): qty 1

## 2014-02-27 MED ORDER — LITHIUM CARBONATE 300 MG PO CAPS
300.0000 mg | ORAL_CAPSULE | Freq: Every day | ORAL | Status: DC
  Administered 2014-02-27 – 2014-03-01 (×3): 300 mg via ORAL
  Filled 2014-02-27 (×4): qty 1

## 2014-02-27 MED ORDER — LITHIUM CARBONATE 300 MG PO CAPS
300.0000 mg | ORAL_CAPSULE | Freq: Every evening | ORAL | Status: DC
  Administered 2014-02-27 – 2014-02-28 (×2): 300 mg via ORAL
  Filled 2014-02-27 (×4): qty 1

## 2014-02-27 NOTE — Progress Notes (Signed)
Pt resting in bed with eyes closed. No distress noted. Will continue to monitor closely.  

## 2014-02-27 NOTE — [facility] Group Notes (Signed)
[facility] LCSW Group Therapy  02/27/2014 4:14 PM   Type of Therapy:  Group Therapy  Participation Level:  Active  Participation Quality:  Attentive  Affect:  Appropriate  Cognitive:  Appropriate  Insight:  Improving  Engagement in Therapy:  Engaged  Modes of Intervention:  Clarification, Education, Exploration and Socialization  Summary of Progress/Problems: Today's group focused on relapse prevention.  We defined the term, and then brainstormed on ways to prevent relapse.  Erastus admitted to relapse, meaning he quit taking his meds.  "I've been through this many times.  You'd think I would learn by now."  He states that he still believes he will be healed, but until then, he insists he will continue to take his meds "because they help me get my plans in order."  He opened the Bible and began preaching to another pt about how one can be healed from Colon cancer.  The scripture didn't sound remotely related to the point he was making, but he preached it any way as the "Holy Hip Hop Rapper."  [name], Zetha Kuhar B 02/27/2014 , 4:14 PM

## 2014-02-27 NOTE — Progress Notes (Signed)
Pt currently on HCTZ 25 mg PO QD, Lisinopril increased to 10 mg PO QD. Will also place on low dose Hydralazine 10 mg Q8 hours. BMP indicates normal renal function and electrolytes and therefore this antihypertensive regimen is reasonable. Please titrate the dose of Lisinopril to max 40 mg PO QD and if BP still > 140/90, titrate the dose of Hydralazine to maximum of 100 mg TID PO. Please allow 48-72 hours before readjusting the dose on medications. Will sign off, please call with any questions.  Debbora PrestoMAGICK-Bay Jarquin, MD  [facility] Pager [phone] Cell [phone]   If 7PM-7AM, please contact night-coverage [email or web address] Password [email or web address]

## 2014-02-27 NOTE — Tx Team (Signed)
  Interdisciplinary Treatment Plan Update   Date Reviewed:  02/27/2014  Time Reviewed:  8:04 AM  Progress in Treatment:   Attending groups: Yes Participating in groups: Yes Taking medication as prescribed: Yes  Tolerating medication: Yes Family/Significant other contact made: Yes  Patient understands diagnosis: Yes  Discussing patient identified problems/goals with staff: Yes Medical problems stabilized or resolved: Yes Denies suicidal/homicidal ideation: Yes Patient has not harmed self or others: Yes  For review of initial/current patient goals, please see plan of care.  Estimated Length of Stay:  4-5 days  Reason for Continuation of Hospitalization: Mania Medication stabilization  New Problems/Goals identified:  N/A  Discharge Plan or Barriers:   [facility], follow up outpt  Additional Comments:  "I am fine ,I am able to summarize my life and set some goals."   Patient reports he slept well. He continues to be tangential, disorganized but improving. His speech continues to be pushed and pressured. Continues to be hyper religious.  His platelets level are low.  Starting today, he is being cros tapered from Depakote to Lithium.   Attendees:  Signature: Thedore MinsMojeed Akintayo, MD 02/27/2014 8:04 AM   Signature: Richelle Itood Kevion Fatheree, LCSW 02/27/2014 8:04 AM  Signature: Fransisca KaufmannLaura Davis, NP 02/27/2014 8:04 AM  Signature: Joslyn Devonaroline Beaudry, RN 02/27/2014 8:04 AM  Signature: Liborio NixonPatrice White, RN 02/27/2014 8:04 AM  Signature:  02/27/2014 8:04 AM  Signature:   02/27/2014 8:04 AM  Signature:    Signature:    Signature:    Signature:    Signature:    Signature:      Scribe for Treatment Team:   Richelle Itood Emerson Barretto, LCSW  02/27/2014 8:04 AM

## 2014-02-27 NOTE — Progress Notes (Signed)
Patient ID: Jerry NighJames L Fricke, male   DOB: 1961/04/18, 53 y.o.   MRN: [id number] D: Patient continues to be hyperreligious.  He is pleasant and cooperative; at times acts silly.  He was observed dancing in the hallway earlier.  He continues to pray over his medications.  He sings songs for staff.  He has bright affect with rapid, pressured speech.  A: Continue to monitor medication management and MD orders.  Safety checks completed every 15 minutes per protocol.  R: Patient is receptive to staff.

## 2014-02-27 NOTE — Progress Notes (Addendum)
[facility] MD Progress Note  02/27/2014 1:35 PM Jerry Carter  MRN:  [id number]  Subjective:  "I am fine ".  Objective Patient seen and chart reviewed.  Patient continues to have disorganized behavior, carries around multiple papers about his life goals and also appears to be tangential with flight of ideas. His speech continues to be pushed and pressured. Continues to be hyper religious. He is going to the groups and participating. He denies any new concerns or side effects. .He has no tremors or shakes.  Discussed with patient regarding the need for a new mood stabilizer since he has abnormal labs (low platelets) on depakote. He is willing to start trial.      Diagnosis:   DSM5: Primary Psychiatric Diagnosis: Schizoaffective Disorder ,Bipolar type,multiple episodes,now in acute episode  Non psychiatric diagnosis: Hypertension  Total Time spent with patient: 30 minutes    ADL's:  Intact  Sleep: Poor  Appetite:  Fair  Suicidal Ideation:  Denies  Homicidal Ideation:  Denies   Psychiatric Specialty Exam: Physical Exam  Constitutional: He is oriented to person, place, and time. He appears well-developed and well-nourished.  HENT:  Head: Normocephalic and atraumatic.  Neck: Normal range of motion.  Respiratory: Effort normal.  Musculoskeletal: Normal range of motion.  Neurological: He is alert and oriented to person, place, and time.    Review of Systems  Constitutional: Negative.   HENT: Negative.   Eyes: Negative.   Respiratory: Negative.   Cardiovascular: Negative.   Gastrointestinal: Negative.   Genitourinary: Negative.   Musculoskeletal: Negative.   Skin: Negative.   Neurological: Negative.   Endo/Heme/Allergies: Negative.   Psychiatric/Behavioral: The patient is nervous/anxious and has insomnia (improving).        Restless    Blood pressure 148/96, pulse 118, temperature 98.1 F (36.7 C), temperature source Oral, resp. rate 20, height $RemoveBe'5\' 8"'lCONEzlYv$  (1.727 m), weight  64.864 kg (143 lb).Body mass index is 21.75 kg/(m^2).  General Appearance: Casual  Eye Contact::  Fair  Speech:  pressured  Volume:  Normal  Mood:  Anxious improving  Affect:  Blunt  Thought Process:  Circumstantial, Disorganized, Irrelevant and Tangential  Orientation:  Full (Time, Place, and Person)  Thought Content:  hyperreligious  Suicidal Thoughts:  No  Homicidal Thoughts:  No  Memory:  Immediate;   Fair Recent;   Fair Remote;   Fair  Judgement:  Impaired  Insight:  Lacking  Psychomotor Activity:  Increased  Concentration:  Fair  Recall:  [employer] of Knowledge:Fair  Language: Good  Akathisia:  No  Handed:  Right  AIMS (if indicated):   0 -none  Assets:  Leisure Time Physical Health Resilience Social Support  Sleep:  Number of Hours: 5.5   Musculoskeletal: Strength & Muscle Tone: within normal limits Gait & Station: normal Patient leans: N/A  Current Medications: Current Facility-Administered Medications  Medication Dose Route Frequency Provider Last Rate Last Dose  . acetaminophen (TYLENOL) tablet 650 mg  650 mg Oral Q4H PRN Waylan Boga, NP      . alum & mag hydroxide-simeth (MAALOX/MYLANTA) 200-200-20 MG/5ML suspension 30 mL  30 mL Oral PRN Waylan Boga, NP      . ARIPiprazole (ABILIFY) tablet 10 mg  10 mg Oral BID PC Mojeed Akintayo   10 mg at 02/27/14 1124  . feeding supplement (ENSURE COMPLETE) (ENSURE COMPLETE) liquid 237 mL  237 mL Oral TID BM Toribio Harbour, RD   237 mL at 02/27/14 1129  . hydrALAZINE (APRESOLINE) tablet 10 mg  10 mg Oral 3 times per day Theodis Blaze, MD   10 mg at 02/27/14 1126  . hydrochlorothiazide (HYDRODIURIL) tablet 25 mg  25 mg Oral Daily Waylan Boga, NP   25 mg at 02/27/14 0804  . lisinopril (PRINIVIL,ZESTRIL) tablet 10 mg  10 mg Oral Daily Delfina Redwood, MD   10 mg at 02/27/14 0804  . lithium carbonate capsule 300 mg  300 mg Oral QPM Pj Zehner, MD      . lithium carbonate capsule 300 mg  300 mg Oral Daily Brenyn Petrey, MD   300 mg at 02/27/14 1212  . LORazepam (ATIVAN) tablet 1 mg  1 mg Oral Q6H PRN Elmarie Shiley, NP   1 mg at 02/18/14 0759  . magnesium hydroxide (MILK OF MAGNESIA) suspension 30 mL  30 mL Oral Daily PRN Waylan Boga, NP      . OLANZapine zydis (ZYPREXA) disintegrating tablet 10 mg  10 mg Oral Q8H PRN Mojeed Akintayo   10 mg at 02/18/14 0800  . ondansetron (ZOFRAN) tablet 4 mg  4 mg Oral Q8H PRN Waylan Boga, NP      . potassium chloride (K-DUR,KLOR-CON) CR tablet 10 mEq  10 mEq Oral Daily Waylan Boga, NP   10 mEq at 02/27/14 0804  . traZODone (DESYREL) tablet 100 mg  100 mg Oral QHS Ursula Alert, MD   100 mg at 02/26/14 2129    Lab Results:  Results for orders placed during the hospital encounter of 02/16/14 (from the past 48 hour(s))  BASIC METABOLIC PANEL     Status: None   Collection Time    02/27/14  6:27 AM      Result Value Ref Range   Sodium 140  137 - 147 mEq/L   Potassium 4.3  3.7 - 5.3 mEq/L   Chloride 100  96 - 112 mEq/L   CO2 28  19 - 32 mEq/L   Glucose, Bld 99  70 - 99 mg/dL   BUN 21  6 - 23 mg/dL   Creatinine, Ser 0.99  0.50 - 1.35 mg/dL   Calcium 9.5  8.4 - 10.5 mg/dL   GFR calc non Af Amer >90  >90 mL/min   GFR calc Af Amer >90  >90 mL/min   Comment: (NOTE)     The eGFR has been calculated using the CKD EPI equation.     This calculation has not been validated in all clinical situations.     eGFR's persistently <90 mL/min signify possible Chronic Kidney     Disease.   Anion gap 12  5 - 15   Comment: Performed at [facility]    Physical Findings: AIMS: Facial and Oral Movements Muscles of Facial Expression: None, normal Lips and Perioral Area: None, normal Jaw: None, normal Tongue: None, normal,Extremity Movements Upper (arms, wrists, hands, fingers): None, normal Lower (legs, knees, ankles, toes): None, normal, Trunk Movements Neck, shoulders, hips: None, normal, Overall Severity Severity of abnormal movements (highest score  from questions above): None, normal Incapacitation due to abnormal movements: None, normal Patient's awareness of abnormal movements (rate only patient's report): No Awareness, Dental Status Current problems with teeth and/or dentures?: No Does patient usually wear dentures?: No  CIWA:    COWS:     Treatment Plan Summary: Daily contact with patient to assess and evaluate symptoms and progress in treatment Medication management  Assessment and Plan:    For psychosis will continue Abilify 10 mg po bid. AIMS - 0 For mood  lability will discontinue Depakote for abnormal platelets as well as lack of efficacy. Will start Lithium 300 mg po qpm and then BID tomorrow. Will get TSH since  starting Lithium. Will get Lithium level on Saturday AM (august 15). Will make available prns for agitation. Will continue trazodone 100mg  at bedtime for sleep ,sleep improving. Encouraged to participate in group milieu therapy .   We will monitor his behavior and medication efficacy.    Reviewed labs -will get TSH today. BMP reviewed (02/27/14). EKG reviewed.  Medical Decision Making Problem Points:  Established problem, worsening (2), New problem, with additional work-up planned (4), Review of last therapy session (1) and Review of psycho-social stressors (1) Data Points:  Review of medication regiment & side effects (2)  I certify that inpatient services furnished can reasonably be expected to improve the patient's condition.   Zaydn Gutridge, 02/27/2014, 1:35 PM

## 2014-02-27 NOTE — Progress Notes (Signed)
D: Pt presents with bright affect and states he is "in a great mood."  Denies SI/HI.  Denies AH/VH at this time.  Pt is in dayroom interacting with peers.  Pt continues to be hyperreligious.  Pt is cooperative, reports his day was "exuberant, excellent, ecstatic.  All E words.  It was a really good day and I enjoyed myself.  I just want to spread the word of God.  That's what I'm going to do when I leave here and that's what I've been doing here."  Pt is easily distracted during evening group but is easily redirected. A: Medications administered per MD order.  Safety maintained.  Will continue to monitor and assess pt for safety. R: Pt is cooperative with staff.  Pt is in no acute distress at this time.  Pt compliant with medications after praying prior to taking medications.  Will continue to monitor for safety.

## 2014-02-28 LAB — TSH: TSH: 2.78 u[IU]/mL (ref 0.350–4.500)

## 2014-02-28 MED ORDER — ARIPIPRAZOLE 10 MG PO TABS
10.0000 mg | ORAL_TABLET | Freq: Every day | ORAL | Status: DC
  Administered 2014-03-01: 10 mg via ORAL
  Filled 2014-02-28 (×2): qty 1

## 2014-02-28 MED ORDER — TRAZODONE HCL 150 MG PO TABS
150.0000 mg | ORAL_TABLET | Freq: Every day | ORAL | Status: DC
  Administered 2014-02-28: 150 mg via ORAL
  Filled 2014-02-28 (×2): qty 1

## 2014-02-28 MED ORDER — ARIPIPRAZOLE 15 MG PO TABS
15.0000 mg | ORAL_TABLET | Freq: Every evening | ORAL | Status: DC
  Administered 2014-02-28: 15 mg via ORAL
  Filled 2014-02-28 (×2): qty 1

## 2014-02-28 NOTE — Progress Notes (Signed)
Patient ID: Jerry NighJames L Ohlrich, male   DOB: 26-Nov-1960, 53 y.o.   MRN: [id number] D: Client hyperactive,religiously preoccupied, "hallelujiah praise the Shaune PollackLord, it is well, well with my soul", reports he had a visit today from spiritual advisor.   A: Writer provided emotional support by listening as client give me some of the qualities of his spiritual advisor. Writer encouraged medication compliance and group.  Client will be monitor q7615min for safety. R: Client is safe on the unit, attends group.

## 2014-02-28 NOTE — [facility] Group Notes (Signed)
[facility]   02/28/2014 10:45 AM  Participation Quality:  Minimal  Mood/Affect:  Excited  Depression Rating:  denies  Anxiety Rating:  denies  Thoughts of Suicide:  No Will you contract for safety?   NA  Current AVH:  No  Plan for Discharge/Comments:  Jerry FearingJames confirms that he got poor sleep again last night.  "I had a lot of things on my mind, and I spent several hours in prayer."  Appetite is good.  Wants to talk to me later about his "d/c plan."  Transportation Means: bus  Supports: faith group  [name], [place]ure[place]ney B

## 2014-02-28 NOTE — [facility] Group Notes (Signed)
[facility]  02/28/2014 , 1:40 PM    Type of Therapy:  [employer] Presentation  Participation Level:  Active  Participation Quality:  Attentive  Affect:  Blunted  Cognitive:  Oriented  Insight:  Limited  Engagement in Therapy:  Engaged  Modes of Intervention:  Discussion, Education and Socialization  Summary of Progress/Problems:  Onalee HuaDavid from [employer] came to present his recovery story and play the guitar.  Emersyn listened attentively throughout.  When the speaker opened it up for questions, and between songs, Fayrene FearingJames had [place] questions and comments.  He was not disruptive.   [name], Stephie Xu B 02/28/2014 , 1:40 PM

## 2014-02-28 NOTE — Progress Notes (Signed)
[facility] MD Progress Note  02/28/2014 2:24 PM Jerry Carter  MRN:  [id number]  Subjective:  "I am felt the evil spirit and I cast him out". Patient reports that he has been feeling the presence of an evil spirit in his room and that he felt like it had touched his foot and so he had to get up and cast him out.Patient reports feeling more calm after starting the lithium.  Objective Patient seen and chart reviewed.  Patient continues to have hallucinations and reports feeling the spirit on him this AM and that Clever. He reports that he is not hyper any more and feels calmer.He denies AH or paranoia. He denies SI/HI. Denies side effects of medications including rigidity, tremors or drooling.He continues to exhibit some disorganized behavior  .  Discussed with patient the need for keeping himself hydrated since he is on lithium.    Diagnosis:   DSM5: Primary Psychiatric Diagnosis: Schizoaffective Disorder ,Bipolar type,multiple episodes,now in acute episode  Non psychiatric diagnosis: Hypertension  Total Time spent with patient: 30 minutes    ADL's:  Intact  Sleep: Poor  Appetite:  Fair  Suicidal Ideation:  Denies  Homicidal Ideation:  Denies   Psychiatric Specialty Exam: Physical Exam  Constitutional: He is oriented to person, place, and time. He appears well-developed and well-nourished.  HENT:  Head: Normocephalic and atraumatic.  Neck: Normal range of motion.  Respiratory: Effort normal.  Musculoskeletal: Normal range of motion.  Neurological: He is alert and oriented to person, place, and time.    Review of Systems  Constitutional: Negative.   HENT: Negative.   Eyes: Negative.   Respiratory: Negative.   Cardiovascular: Negative.   Gastrointestinal: Negative.   Genitourinary: Negative.   Musculoskeletal: Negative.   Skin: Negative.   Neurological: Negative.   Endo/Heme/Allergies: Negative.   Psychiatric/Behavioral: The patient is nervous/anxious  (improving) and has insomnia.        Reports feeling calmer    Blood pressure 138/92, pulse 95, temperature 97.8 F (36.6 C), temperature source Oral, resp. rate 18, height 5' 8" (1.727 m), weight 64.864 kg (143 lb).Body mass index is 21.75 kg/(m^2).  General Appearance: Casual  Eye Contact::  Fair  Speech:  pressured  Volume:  Normal  Mood:  Anxious improving  Affect:  Blunt  Thought Process:  Circumstantial, Disorganized, Irrelevant and Tangential  Orientation:  Full (Time, Place, and Person)  Thought Content:  Hallucinations: reports feeling the evil spirit and that he cast him out and hyperreligious  Suicidal Thoughts:  No  Homicidal Thoughts:  No  Memory:  Immediate;   Fair Recent;   Fair Remote;   Fair  Judgement:  Impaired  Insight:  Lacking  Psychomotor Activity:  Increased  Concentration:  Fair  Recall:  [employer] of Knowledge:Fair  Language: Good  Akathisia:  No  Handed:  Right  AIMS (if indicated):   0 -none  Assets:  Leisure Time Physical Health Resilience Social Support  Sleep:  Number of Hours: 2   Musculoskeletal: Strength & Muscle Tone: within normal limits Gait & Station: normal Patient leans: N/A  Current Medications: Current Facility-Administered Medications  Medication Dose Route Frequency Provider Last Rate Last Dose  . acetaminophen (TYLENOL) tablet 650 mg  650 mg Oral Q4H PRN Waylan Boga, NP      . alum & mag hydroxide-simeth (MAALOX/MYLANTA) 200-200-20 MG/5ML suspension 30 mL  30 mL Oral PRN Waylan Boga, NP      . Derrill Memo ON 03/01/2014] ARIPiprazole (  ABILIFY) tablet 10 mg  10 mg Oral Daily  , MD      . ARIPiprazole (ABILIFY) tablet 15 mg  15 mg Oral QPM  , MD      . feeding supplement (ENSURE COMPLETE) (ENSURE COMPLETE) liquid 237 mL  237 mL Oral TID BM Toribio Harbour, RD   237 mL at 02/28/14 0928  . hydrALAZINE (APRESOLINE) tablet 10 mg  10 mg Oral 3 times per day Theodis Blaze, MD   10 mg at 02/28/14 0538  .  hydrochlorothiazide (HYDRODIURIL) tablet 25 mg  25 mg Oral Daily Waylan Boga, NP   25 mg at 02/28/14 0827  . lisinopril (PRINIVIL,ZESTRIL) tablet 10 mg  10 mg Oral Daily Delfina Redwood, MD   10 mg at 02/28/14 0827  . lithium carbonate capsule 300 mg  300 mg Oral QPM  , MD   300 mg at 02/27/14 1706  . lithium carbonate capsule 300 mg  300 mg Oral Daily Ursula Alert, MD   300 mg at 02/28/14 0827  . magnesium hydroxide (MILK OF MAGNESIA) suspension 30 mL  30 mL Oral Daily PRN Waylan Boga, NP      . OLANZapine zydis (ZYPREXA) disintegrating tablet 10 mg  10 mg Oral Q8H PRN Mojeed Akintayo   10 mg at 02/18/14 0800  . ondansetron (ZOFRAN) tablet 4 mg  4 mg Oral Q8H PRN Waylan Boga, NP      . potassium chloride (K-DUR,KLOR-CON) CR tablet 10 mEq  10 mEq Oral Daily Waylan Boga, NP   10 mEq at 02/28/14 0826  . traZODone (DESYREL) tablet 150 mg  150 mg Oral QHS Ursula Alert, MD        Lab Results:  Results for orders placed during the hospital encounter of 02/16/14 (from the past 48 hour(s))  BASIC METABOLIC PANEL     Status: None   Collection Time    02/27/14  6:27 AM      Result Value Ref Range   Sodium 140  137 - 147 mEq/L   Potassium 4.3  3.7 - 5.3 mEq/L   Chloride 100  96 - 112 mEq/L   CO2 28  19 - 32 mEq/L   Glucose, Bld 99  70 - 99 mg/dL   BUN 21  6 - 23 mg/dL   Creatinine, Ser 0.99  0.50 - 1.35 mg/dL   Calcium 9.5  8.4 - 10.5 mg/dL   GFR calc non Af Amer >90  >90 mL/min   GFR calc Af Amer >90  >90 mL/min   Comment: (NOTE)     The eGFR has been calculated using the CKD EPI equation.     This calculation has not been validated in all clinical situations.     eGFR's persistently <90 mL/min signify possible Chronic Kidney     Disease.   Anion gap 12  5 - 15   Comment: Performed at [facility]  TSH     Status: None   Collection Time    02/27/14  7:57 PM      Result Value Ref Range   TSH 2.780  0.350 - 4.500 uIU/mL   Comment: Performed at  [facility]    Physical Findings: AIMS: Facial and Oral Movements Muscles of Facial Expression: None, normal Lips and Perioral Area: None, normal Jaw: None, normal Tongue: None, normal,Extremity Movements Upper (arms, wrists, hands, fingers): None, normal Lower (legs, knees, ankles, toes): None, normal, Trunk Movements Neck, shoulders, hips: None, normal, Overall Severity  Severity of abnormal movements (highest score from questions above): None, normal Incapacitation due to abnormal movements: None, normal Patient's awareness of abnormal movements (rate only patient's report): No Awareness, Dental Status Current problems with teeth and/or dentures?: No Does patient usually wear dentures?: No  CIWA:    COWS:     Treatment Plan Summary: Daily contact with patient to assess and evaluate symptoms and progress in treatment Medication management  Assessment and Plan:    For psychosis will increase Abilify to 10 mg daily and 15 mg qpm.AIMS - 0 For mood lability will continue lithium at the current dose. Order placed for Li level and BMP (august 15 th Saturday am ). Will make available prns for agitation. Will increase trazodone to 13m at bedtime for sleep. Encouraged to participate in group milieu therapy .   We will monitor his behavior and medication efficacy.   Discussed with patient to drink enough fluid to avoid Li toxicity.  Reviewed labs -TSH (wnl) Results for CELIZEO, RODRIQUES(MRN [id number] as of 02/28/2014 14:23  Ref. Range 02/16/2014 02:37 02/16/2014 02:42 02/22/2014 06:20 02/27/2014 06:27 02/27/2014 19:57  TSH Latest Range: 0.350-4.500 uIU/mL     2.780   Medical Decision Making Problem Points:  Established problem, worsening (2), New problem, with additional work-up planned (4), Review of last therapy session (1) and Review of psycho-social stressors (1) Data Points:  Review of medication regiment & side effects (2)  I certify that inpatient services furnished can  reasonably be expected to improve the patient's condition.   Tkeyah Burkman, 02/28/2014, 2:24 PM

## 2014-02-28 NOTE — Procedures (Signed)
D: Pt. remains compliant with ordered treatment regimen including medications and groups. Cooperative with unit routines and care. Visible in milieu at intervals during shift. Continues to be hyperactive but maintains appropriate interactions with staff and peers. Pt. denied pain, HI /SI/ AVH when assessed. Reported poor sleep last night ( "bad spirit came into the room, I had to pray it away ")  but states he is ok now. R: Will continue to monitor MD's orders and pt. for changes in current status. R: Pt. has been getting his needs met safely so far this shift.

## 2014-03-01 MED ORDER — TRAZODONE HCL 100 MG PO TABS
200.0000 mg | ORAL_TABLET | Freq: Every day | ORAL | Status: DC
  Administered 2014-03-01 – 2014-03-04 (×4): 200 mg via ORAL
  Filled 2014-03-01 (×6): qty 2

## 2014-03-01 MED ORDER — LITHIUM CARBONATE 300 MG PO CAPS
300.0000 mg | ORAL_CAPSULE | Freq: Two times a day (BID) | ORAL | Status: DC
  Administered 2014-03-01 – 2014-03-05 (×8): 300 mg via ORAL
  Filled 2014-03-01 (×12): qty 1

## 2014-03-01 MED ORDER — ARIPIPRAZOLE 15 MG PO TABS
15.0000 mg | ORAL_TABLET | Freq: Two times a day (BID) | ORAL | Status: DC
  Administered 2014-03-01 – 2014-03-08 (×14): 15 mg via ORAL
  Filled 2014-03-01 (×7): qty 1
  Filled 2014-03-01: qty 28
  Filled 2014-03-01 (×9): qty 1
  Filled 2014-03-01: qty 28
  Filled 2014-03-01: qty 1

## 2014-03-01 NOTE — [facility] Group Notes (Signed)
[facility] Group Notes:  (Counselor/Nursing/MHT/Case Management/Adjunct)  03/01/2014 1:15PM  Type of Therapy:  Group Therapy  Participation Level:  Active  Participation Quality:  Appropriate  Affect:  Flat  Cognitive:  Oriented  Insight:  Improving  Engagement in Group:  Limited  Engagement in Therapy:  Limited  Modes of Intervention:  Discussion, Exploration and Socialization  Summary of Progress/Problems: The topic for group was balance in life.  Pt participated in the discussion about when their life was in balance and out of balance and how this feels.  Pt discussed ways to get back in balance and short term goals they can work on to get where they want to be.   Jerry Carter was engaged throughout.  He was appropriate, though his religious preoccupation broke through a couple of times.  He just can't help himself.  He shared with the group that one of his supports outside of here has committed to helping find him a place to go rather than d/cing to the shelter, and he feels balanced based on that.   [name], [name] 03/01/2014 2:40 PM

## 2014-03-01 NOTE — Progress Notes (Signed)
Patient ID: Jerry Carter, male   DOB: 23-Apr-1961, 53 y.o.   MRN: [id number] D: Client religiously preoccupied, but pleasant, "will you touch and agree with me for all my enemies to be blessed." Client reports that he is writing a manuscript to be distributed to Rod and the elders of his church. Client says he took a shower today and had clothes washed. A: Writer commended client on cleanliness, encourage him to report any concerns. Staff will monitor q5715min for safety. R: Client is safe on the unit.

## 2014-03-01 NOTE — Clinical Social Work Note (Signed)
Made contact with a Mr Janee Mornhompson today who has known Jerry Carter for 25 years.  He confirmed that Jerry Carter does well as long as he is taking medication.  When told that Jerry Carter plans to go to the shelter, Mr Janee Mornhompson insisted we come up with another plan.  He is taking the lead on that.  I let him know Jerry Carter would likely be d/ced on Monday.  Phone number is [phone].

## 2014-03-01 NOTE — Progress Notes (Signed)
Pt alert and oriented x4. Pt was talkative and hyper this morning. Pt attended morning group with RN. Pt states that his goals are "to become more like Christ". Pt says that he wants to keep his hygiene and appearance up so that people will listen to him and so that he can make a difference in the world. Pt denies any pain. Pt denies SI/HI/AH/VH. Pt ate breakfast and lunch and tolerated it well. Pt took meds as directed and tolerated them as well. No current concerns. Pt remains safe and appropriate in milieu.

## 2014-03-01 NOTE — Tx Team (Signed)
  Interdisciplinary Treatment Plan Update   Date Reviewed:  03/01/2014  Time Reviewed:  11:02 AM  Progress in Treatment:   Attending groups: Yes Participating in groups: Yes Taking medication as prescribed: Yes  Tolerating medication: Yes Family/Significant other contact made: Yes  Patient understands diagnosis: Yes  Discussing patient identified problems/goals with staff: Yes Medical problems stabilized or resolved: Yes Denies suicidal/homicidal ideation: Yes Patient has not harmed self or others: Yes  For review of initial/current patient goals, please see plan of care.  Estimated Length of Stay:  4-5 days  Reason for Continuation of Hospitalization: Medication stabilization Other; describe Disorganization, Mood stabilization  New Problems/Goals identified:  N/A  Discharge Plan or Barriers:   Homeless shelter, follow up [facility]  Additional Comments:  "I  felt the evil spirit and I cast him out". Patient reports that he has been feeling the presence of an evil spirit in his room and that he felt like it had touched his foot and so he had to get up and cast him out.Patient reports feeling more calm after starting the lithium.   He reports that he is not hyper any more and feels calmer.Marland Kitchen.He continues to exhibit some disorganized behavior .   Attendees:  Signature: Thedore MinsMojeed Akintayo, MD 03/01/2014 11:02 AM   Signature: Richelle Itood Marlee Armenteros, LCSW 03/01/2014 11:02 AM  Signature: Fransisca KaufmannLaura Davis, NP 03/01/2014 11:02 AM  Signature: Joslyn Devonaroline Beaudry, RN 03/01/2014 11:02 AM  Signature: Liborio NixonPatrice White, RN 03/01/2014 11:02 AM  Signature:  03/01/2014 11:02 AM  Signature:   03/01/2014 11:02 AM  Signature:    Signature:    Signature:    Signature:    Signature:    Signature:      Scribe for Treatment Team:   Richelle Itood Corin Formisano, LCSW  03/01/2014 11:02 AM

## 2014-03-01 NOTE — Progress Notes (Signed)
Patient ID: Jerry Carter, male   DOB: 1961-03-09, 53 y.o.   MRN: [id number] [facility] MD Progress Note  03/01/2014 11:10 AM Jerry Carter  MRN:  [id number] Subjective: Patient states: "I am so excited about the Lord, because faith gives me peace.''   Objective: Patient was seen and his chart was reviewed. Patient remains manic,  Euphoric, grandiose, delusional, very religiously preoccupied, with pressured and tangential speech. His thought process is bizarre and disorganized. Patient is fixated on "spiritual warfare" being "healed by reading my bible" and reaching out to his pastor. However, he is compliant with his medications.  Patient reports difficulty sleeping due to racing thoughts. He has been participating in the unit milieu but needs to be redirected very often.   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 Hypertension    Axis IV: other psychosocial or environmental problems and problems related to social environment  ADL's:  Intact  Sleep: Fair  Appetite:  Fair  Suicidal Ideation:  Plan:  denies Intent:  denies Means:  denies Homicidal Ideation:  Denies  AEB (as evidenced by):  Psychiatric Specialty Exam: Physical Exam  Psychiatric: His affect is labile. His speech is rapid and/or pressured. He is hyperactive and actively hallucinating. Thought content is delusional. Cognition and memory are normal. He expresses impulsivity.    Review of Systems  Constitutional: Negative.   HENT: Negative.   Eyes: Negative.   Respiratory: Negative.   Cardiovascular: Negative.   Gastrointestinal: Negative.    Genitourinary: Negative.   Musculoskeletal: Negative.   Skin: Negative.   Neurological: Negative.   Endo/Heme/Allergies: Negative.   Psychiatric/Behavioral: The patient is nervous/anxious and has insomnia.     Blood pressure 108/70, pulse 103, temperature 98 F (36.7 C), temperature source Oral, resp. rate 20, height 5\' 8"  (1.727 m), weight 64.864 kg (143 lb).Body mass index is 21.75 kg/(m^2).  General Appearance: Disheveled  Eye Contact::  Minimal  Speech:  Pressured, tangential  Volume:  Increased  Mood:  Irritable  Affect:  Labile and Full Range  Thought Process:  Disorganized  Orientation:  Full (Time, Place, and Person)  Thought Content:  Delusions  Suicidal Thoughts:  No  Homicidal Thoughts:  No  Memory:  Immediate;   Fair Recent;   Fair Remote;   Fair  Judgement:  Impaired  Insight:  Lacking  Psychomotor Activity:  Increased  Concentration:  Fair  Recall:  [employer] of Knowledge:Fair  Language: Good  Akathisia:  No  Handed:  Right  AIMS (if indicated):     Assets:  Communication Skills Desire for Improvement Physical Health  Sleep:  Number of Hours: 2   Musculoskeletal: Strength & Muscle Tone: within normal limits Gait & Station: normal Patient leans: N/A  Current Medications: Current Facility-Administered Medications  Medication Dose Route Frequency Provider Last Rate Last Dose  . acetaminophen (TYLENOL) tablet 650 mg  650 mg Oral Q4H PRN Nanine Means, NP      . alum & mag hydroxide-simeth (MAALOX/MYLANTA) 200-200-20 MG/5ML suspension 30 mL  30 mL Oral PRN Nanine Means, NP      . ARIPiprazole (ABILIFY) tablet 15 mg  15 mg Oral BID PC Cadince Hilscher      .  feeding supplement (ENSURE COMPLETE) (ENSURE COMPLETE) liquid 237 mL  237 mL Oral TID BM Dagon Budai   237 mL at 03/01/14 1000  . hydrALAZINE (APRESOLINE) tablet 10 mg  10 mg Oral 3 times per day Dorothea OgleIskra M Myers, MD   10 mg at 03/01/14 0651  . hydrochlorothiazide (HYDRODIURIL) tablet 25 mg  25 mg Oral  Daily Nanine MeansJamison Lord, NP   25 mg at 03/01/14 0835  . lisinopril (PRINIVIL,ZESTRIL) tablet 10 mg  10 mg Oral Daily Christiane Haorinna L Sullivan, MD   10 mg at 03/01/14 0835  . lithium carbonate capsule 300 mg  300 mg Oral BID WC Jovonna Nickell      . magnesium hydroxide (MILK OF MAGNESIA) suspension 30 mL  30 mL Oral Daily PRN Nanine MeansJamison Lord, NP      . OLANZapine zydis (ZYPREXA) disintegrating tablet 10 mg  10 mg Oral Q8H PRN Kristianne Albin   10 mg at 02/18/14 0800  . ondansetron (ZOFRAN) tablet 4 mg  4 mg Oral Q8H PRN Nanine MeansJamison Lord, NP      . potassium chloride (K-DUR,KLOR-CON) CR tablet 10 mEq  10 mEq Oral Daily Nanine MeansJamison Lord, NP   10 mEq at 03/01/14 0800  . traZODone (DESYREL) tablet 150 mg  150 mg Oral QHS Jomarie LongsSaramma Eappen, MD   150 mg at 02/28/14 2133    Lab Results:  Results for orders placed during the hospital encounter of 02/16/14 (from the past 48 hour(s))  TSH     Status: None   Collection Time    02/27/14  7:57 PM      Result Value Ref Range   TSH 2.780  0.350 - 4.500 uIU/mL   Comment[facility]     Physical Findings: AIMS: Facial and Oral Movements Muscles of Facial Expression: None, normal Lips and Perioral Area: None, normal Jaw: None, normal Tongue: None, normal,Extremity Movements Upper (arms, wrists, hands, fingers): None, normal Lower (legs, knees, ankles, toes): None, normal, Trunk Movements Neck, shoulders, hips: None, normal, Overall Severity Severity of abnormal movements (highest score from questions above): None, normal Incapacitation due to abnormal movements: None, normal Patient's awareness of abnormal movements (rate only patient's report): No Awareness, Dental Status Current problems with teeth and/or dentures?: No Does patient usually wear dentures?: No  CIWA:    COWS:     Treatment Plan Summary: Daily contact with patient to assess and evaluate symptoms and progress in treatment Medication management  Plan:1. Admit for crisis management  and stabilization. 2. Medication management to reduce current symptoms to base line and improve the  patient's overall level of functioning: -Increase Abilify to 15mg  po bid for delusions -Increase Lithium Carbonate to 300mg  po BID for mood stabilization -Increase Trazodone to 200mg  po qhs for insomnia 3. Treat health problems as indicated. 4. Develop treatment plan to decrease risk of relapse upon discharge and the need for  readmission. 5. Psycho-social education regarding relapse prevention and self care. 6. Health care follow up as needed for medical problems.    Medical Decision Making Problem Points:  Established problem, worsening (2), Review of last therapy session (1) and Review of psycho-social stressors (1) Data Points:  Order Aims Assessment (2) Review or order clinical lab tests (1) Review of medication regiment & side effects (2) Review of new medications or change in dosage (2)  I certify that inpatient services furnished can reasonably be expected to improve the patient's condition.   Thedore MinsAkintayo, Aidan Caloca, MD 03/01/2014, 11:10 AM

## 2014-03-01 NOTE — Care Management Utilization Note (Signed)
   Per State Regulation 482.30  This chart was reviewed for necessity with respect to the patient's Admission/ Duration of stay.  Next review date: 03/04/14  Nicolasa Duckingrystal Morrison RN, BSN

## 2014-03-02 MED ORDER — POLYETHYLENE GLYCOL 3350 17 G PO PACK
17.0000 g | PACK | Freq: Every day | ORAL | Status: DC
  Administered 2014-03-03 – 2014-03-06 (×3): 17 g via ORAL
  Filled 2014-03-02 (×9): qty 1

## 2014-03-02 MED ORDER — HALOPERIDOL LACTATE 5 MG/ML IJ SOLN
5.0000 mg | Freq: Once | INTRAMUSCULAR | Status: AC
  Administered 2014-03-02: 5 mg via INTRAMUSCULAR
  Filled 2014-03-02: qty 1

## 2014-03-02 MED ORDER — HALOPERIDOL LACTATE 5 MG/ML IJ SOLN
INTRAMUSCULAR | Status: AC
  Filled 2014-03-02: qty 1

## 2014-03-02 MED ORDER — DIPHENHYDRAMINE HCL 50 MG/ML IJ SOLN
50.0000 mg | Freq: Once | INTRAMUSCULAR | Status: AC
  Administered 2014-03-02: 50 mg via INTRAMUSCULAR
  Filled 2014-03-02: qty 1

## 2014-03-02 MED ORDER — LORAZEPAM 2 MG/ML IJ SOLN
2.0000 mg | Freq: Once | INTRAMUSCULAR | Status: AC
  Administered 2014-03-02: 2 mg via INTRAMUSCULAR

## 2014-03-02 MED ORDER — LORAZEPAM 2 MG/ML IJ SOLN
INTRAMUSCULAR | Status: AC
  Filled 2014-03-02: qty 1

## 2014-03-02 MED ORDER — GABAPENTIN 100 MG PO CAPS
100.0000 mg | ORAL_CAPSULE | Freq: Three times a day (TID) | ORAL | Status: DC
  Administered 2014-03-02 – 2014-03-05 (×9): 100 mg via ORAL
  Filled 2014-03-02 (×15): qty 1

## 2014-03-02 MED ORDER — DIPHENHYDRAMINE HCL 50 MG/ML IJ SOLN
INTRAMUSCULAR | Status: AC
  Filled 2014-03-02: qty 1

## 2014-03-02 NOTE — [facility] Group Notes (Signed)
[facility]   03/02/2014 10:46 AM  Participation Quality:  Engaged  Mood/Affect:  Euphoric  Depression Rating:  denies  Anxiety Rating:  denies  Thoughts of Suicide:  No Will you contract for safety?   NA  Current AVH:  No  Plan for Discharge/Comments:  Fayrene FearingJames admits that he did not slepp all night.  "I was thinking a lot, and engaged in prayer."  States he wants to talk to me outside of group "prayerfully."  Transportation Means: [place] scientistThompson  Supports: church   [place], Minerva ParkRodney B

## 2014-03-02 NOTE — [facility] Group Notes (Signed)
[facility] LCSW Group Therapy  03/02/2014  1:05 PM  Type of Therapy:  Group therapy  Participation Level:  Did not attend  Summary of Progress/Problems:  Chaplain was here to lead a group on themes of hope and courage.  [name], Ravneet Spilker B 03/02/2014 1:46 PM

## 2014-03-02 NOTE — Progress Notes (Signed)
[facility]  03/02/2014 6:27 PM Jerry NighJames L Gebbia  MRN:  [id number]  Subjective:  " My bowel is not moving ,god is very wise ".Patient reports that he has problems with room mate and does not want to be in the same room with him.  Objective Patient seen and chart reviewed.  Patient continues to be manic ,hyperreligious ,pressured speech,disorganized. Patient per staff was up all night and was working on his papers that he carries around. He denies AH or paranoia. He denies SI/HI. Denies side effects of medications including rigidity, tremors or drooling.  Patient also had an incident when he was loud and not redirectable in the day room and was given prns for agitation.      Diagnosis:   DSM5: Primary Psychiatric Diagnosis: Schizoaffective Disorder ,Bipolar type,multiple episodes,now in acute episode  Non psychiatric diagnosis:  Hypertension  Total Time spent with patient: 30 minutes    ADL's:  Intact  Sleep: Poor  Appetite:  Fair  Suicidal Ideation:  Denies  Homicidal Ideation:  Denies   Psychiatric Specialty Exam: Physical Exam  Constitutional: He is oriented to person, place, and time. He appears well-developed and well-nourished.  HENT:  Head: Normocephalic and atraumatic.  Neck: Normal range of motion.  Respiratory: Effort normal.  Musculoskeletal: Normal range of motion.  Neurological: He is alert and oriented to person, place, and time.    Review of Systems  Constitutional: Negative.   HENT: Negative.   Eyes: Negative.   Respiratory: Negative.   Cardiovascular: Negative.   Gastrointestinal: Negative.   Genitourinary: Negative.   Musculoskeletal: Negative.   Skin: Negative.   Neurological: Negative.   Endo/Heme/Allergies: Negative.   Psychiatric/Behavioral: The patient is nervous/anxious (Restless ,agitated) and has insomnia.        Reports feeling calmer    Blood pressure 115/71, pulse 66, temperature 97.8 F (36.6 C), temperature source  Oral, resp. rate 18, height 5\' 8"  (1.727 m), weight 64.864 kg (143 lb).Body mass index is 21.75 kg/(m^2).  General Appearance: Casual  Eye Contact::  Fair  Speech:  pressured  Volume:  Normal  Mood:  Anxious improving  Affect:  Blunt  Thought Process:  Circumstantial, Disorganized, Irrelevant and Tangential  Orientation:  Full (Time, Place, and Person)  Thought Content:  Hallucinations: reports feeling the evil spirit and that he cast him out and hyperreligious  Suicidal Thoughts:  No  Homicidal Thoughts:  No  Memory:  Immediate;   Fair Recent;   Fair Remote;   Fair  Judgement:  Impaired  Insight:  Lacking  Psychomotor Activity:  Increased  Concentration:  Fair  Recall:  [employer]  Fund of Knowledge:Fair  Language: Good  Akathisia:  No  Handed:  Right  AIMS (if indicated):   0 -none  Assets:  Leisure Time Physical Health Resilience Social Support  Sleep:  Number of Hours: 4   Musculoskeletal: Strength & Muscle Tone: within normal limits Gait & Station: normal Patient leans: N/A  Current Medications: Current Facility-Administered Medications  Medication Dose Route Frequency Provider Last Rate Last Dose  . acetaminophen (TYLENOL) tablet 650 mg  650 mg Oral Q4H PRN Nanine MeansJamison Lord, NP      . alum & mag hydroxide-simeth (MAALOX/MYLANTA) 200-200-20 MG/5ML suspension 30 mL  30 mL Oral PRN Nanine MeansJamison Lord, NP      . ARIPiprazole (ABILIFY) tablet 15 mg  15 mg Oral BID PC Mojeed Akintayo   15 mg at 03/02/14 0813  . feeding supplement (ENSURE COMPLETE) (ENSURE COMPLETE) liquid 237 mL  237 mL Oral TID BM Mojeed Akintayo   237 mL at 03/02/14 1425  . gabapentin (NEURONTIN) capsule 100 mg  100 mg Oral TID Jomarie Longs, MD   100 mg at 03/02/14 1210  . hydrALAZINE (APRESOLINE) tablet 10 mg  10 mg Oral 3 times per day Dorothea Ogle, MD   10 mg at 03/02/14 1610  . hydrochlorothiazide (HYDRODIURIL) tablet 25 mg  25 mg Oral Daily Nanine Means, NP   25 mg at 03/02/14 0813  . lisinopril  (PRINIVIL,ZESTRIL) tablet 10 mg  10 mg Oral Daily Christiane Ha, MD   10 mg at 03/02/14 0813  . lithium carbonate capsule 300 mg  300 mg Oral BID WC Mojeed Akintayo   300 mg at 03/02/14 0813  . magnesium hydroxide (MILK OF MAGNESIA) suspension 30 mL  30 mL Oral Daily PRN Nanine Means, NP      . OLANZapine zydis (ZYPREXA) disintegrating tablet 10 mg  10 mg Oral Q8H PRN Mojeed Akintayo   10 mg at 02/18/14 0800  . ondansetron (ZOFRAN) tablet 4 mg  4 mg Oral Q8H PRN Nanine Means, NP      . potassium chloride (K-DUR,KLOR-CON) CR tablet 10 mEq  10 mEq Oral Daily Nanine Means, NP   10 mEq at 03/02/14 0813  . traZODone (DESYREL) tablet 200 mg  200 mg Oral QHS Mojeed Akintayo   200 mg at 03/01/14 2106    Lab Results:  No results found for this or any previous visit (from the past 48 hour(s)).  Physical Findings: AIMS: Facial and Oral Movements Muscles of Facial Expression: None, normal Lips and Perioral Area: None, normal Jaw: None, normal Tongue: None, normal,Extremity Movements Upper (arms, wrists, hands, fingers): None, normal Lower (legs, knees, ankles, toes): None, normal, Trunk Movements Neck, shoulders, hips: None, normal, Overall Severity Severity of abnormal movements (highest score from questions above): None, normal Incapacitation due to abnormal movements: None, normal Patient's awareness of abnormal movements (rate only patient's report): No Awareness, Dental Status Current problems with teeth and/or dentures?: No Does patient usually wear dentures?: No  CIWA:    COWS:     Treatment Plan Summary: Daily contact with patient to assess and evaluate symptoms and progress in treatment Medication management  Assessment and Plan:    For psychosis will continue Abilify 15 mg bid .AIMS - 0 For mood lability will continue lithium at the current dose. Order placed for Li level and BMP (august 15 th Saturday am ).Could increase Li once levels are back and if subtherapeutic. (Depakote  discontinued 2/2 low platelets). Will add gabapentin 100 mg po tid for mood swings/anxiety. Will make available prns for agitation. Will continue trazodone 200mg  at bedtime for sleep. Encouraged to participate in group milieu therapy .   We will monitor his behavior and medication efficacy.   Discussed with patient to drink enough fluid to avoid Li toxicity.  For constipation will add Miralax po daily.  Medical Decision Making Problem Points:  Established problem, worsening (2), New problem, with additional work-up planned (4), Review of last therapy session (1) and Review of psycho-social stressors (1) Data Points:  Review of medication regiment & side effects (2)  I certify that inpatient services furnished can reasonably be expected to improve the patient's condition.   Hani Patnode, 03/02/2014, 6:27 PM

## 2014-03-02 NOTE — Progress Notes (Signed)
Pt did attend group but had a hard time staying on topic. Everything that he talked about was related to religion.

## 2014-03-02 NOTE — Progress Notes (Signed)
[facility] Group Notes:  (Nursing/MHT/Case Management/Adjunct)  Date:  03/02/2014  Time:  10:59 PM  Type of Therapy:  Psychoeducational Skills  Participation Level:  Active  Participation Quality:  Monopolizing  Affect:  Excited  Cognitive:  Disorganized  Insight:  Lacking  Engagement in Group:  Monopolizing and Off Topic  Modes of Intervention:  Education  Summary of Progress/Problems: The patient was unable to discuss how his day went. He mentioned the following regarding his day: he had a"fight a good fight". The patient indicated that he attended one or two of the groups. As a theme for the day, his relapse prevention will include memorizing the Bible in 40 days. After being asked to come up with a smaller/more attainable relapse prevention he said that he would look into using exercise to cope.   Hazle CocaGOODMAN, Maximillion Gill S 03/02/2014, 10:59 PM

## 2014-03-02 NOTE — Progress Notes (Signed)
Patient ID: LEONCE BALE, male   DOB: [birth year], 53 y.o.   MRN: [id number] D. Patient presents with euphoric mood, affect congruent. Vedder presents very manic this morning, with pressured, tangential speech. He continues to be fixated on current room and room mate stating '' I can not room in here, the evil spirits are after me. I can't handle it. '' Patient then states '' I am going to in forty days, memorize the entire constitution and the works of god. It is all about the healing and power of the father. '' Patient had been able to be verbally redirected, however later in dayroom started speaking to peers stating '' the devil is in you '' patient and peer began yelling, threatening peers stating ''i'm going to knock them all out. ''  and had to be verbally redirected.A. Notified Dr. Jannifer Franklin of above information and orders received for haldol  IM STAT, ativan 2 mg IM STAT, and benadryl  IM STAT. Patient accepted injections without issue. Pt tolerated well and is in no acute distress at this time. R. Patient is safe and is in no acute distress at this time. Will continue to monitor q 15 minutes for safety.

## 2014-03-03 LAB — BASIC METABOLIC PANEL
Anion gap: 10 (ref 5–15)
BUN: 18 mg/dL (ref 6–23)
CHLORIDE: 98 meq/L (ref 96–112)
CO2: 30 meq/L (ref 19–32)
Calcium: 9.1 mg/dL (ref 8.4–10.5)
Creatinine, Ser: 1.16 mg/dL (ref 0.50–1.35)
GFR calc Af Amer: 80 mL/min — ABNORMAL LOW (ref >90)
GFR, EST NON AFRICAN AMERICAN: 69 mL/min — AB (ref >90)
GLUCOSE: 92 mg/dL (ref 70–99)
Potassium: 4.4 mEq/L (ref 3.7–5.3)
SODIUM: 138 meq/L (ref 137–147)

## 2014-03-03 LAB — GLUCOSE, CAPILLARY: Glucose-Capillary: 87 mg/dL (ref 70–99)

## 2014-03-03 LAB — LITHIUM LEVEL: Lithium Lvl: 0.53 mEq/L — ABNORMAL LOW (ref 0.80–1.40)

## 2014-03-03 NOTE — Progress Notes (Signed)
Patient ID: Ronelle NighJames L Panjwani, male   DOB: Apr 14, 1961, 53 y.o.   MRN: [id number] D. Patient presents with euphoric mood, affect congruent again today. Fayrene FearingJames continues to have very tangential, pressured speech. He is very religiously preoccupied stating '' The holy ghost is upon me, and forty days and forty nights I will be called to memorize the bible. I'm going to only eat fruits like pears and peaches, and i'm going to start a religious group called the discombobulated duo. A. Discussed above information with Renata Capriceonrad NP . Support, redirection provided.  R. Patient is safe and is in no acute distress at this time. Will continue to monitor q 15 minutes for safety.

## 2014-03-03 NOTE — [facility] Group Notes (Signed)
[facility] Group Notes:  (Clinical Social Work)  03/03/2014  11:15AM - 12:00PM  Summary of Progress/Problems:   The main focus of today's process group was to discuss feelings related to being hospitalized, as well as the difference between "being" and "having" a mental health diagnosis.  It was agreed in general by the group that it would be preferable to avoid future hospitalizations, and we discussed means of doing that.  As a follow-up, problems with adhering to medication recommendations were discussed.  The patient expressed himself frequently but inappropriately, reading out of his Bible so rapidly that his speech slurred.  Whatever discussion was going on, he brought his religion up as the remedy.  He was very pleasant and positive, but difficult to redirect.  Type of Therapy:  Group Therapy - Process  Participation Level:  Active  Participation Quality:  Intrusive and Monopolizing  Affect:  Excited  Cognitive:  Delusional  Insight:  Limited  Engagement in Therapy:  Developing/Improving  Modes of Intervention:  Exploration, Discussion  Ambrose MantleMareida Grossman-Orr, LCSW 03/03/2014, 12:30

## 2014-03-03 NOTE — Progress Notes (Signed)
Patient ID: Jerry Carter, male   DOB: 10/27/1960, 53 y.o.   MRN: [id number] Psychoeducational Group Note  Date:  03/03/2014 Time:0900am  Group Topic/Focus:  Identifying Needs:   The focus of this group is to help patients identify their personal needs that have been historically problematic and identify healthy behaviors to address their needs.  Participation Level:  Active  Participation Quality:  Redirectable  Affect:  Anxious  Cognitive:  Disorganized and Confused  Insight:  Limited  Engagement in Group:  Limited  Additional Comments:  Inventory group   Jerry Carter, Jerry Carter 03/03/2014,11:07 AM

## 2014-03-03 NOTE — Progress Notes (Signed)
The focus of this group is to help patients review their daily goal of treatment and discuss progress on daily workbooks. Pt attended the evening group and responded to all discussion prompts from the Writer. Pt told the group he had a good day because it was blessed by the Walt DisneyLord. Pt presented with fast-paced, pressured speech and his every comment related back to his religion. Pt told the group his goal for tomorrow was to rest and take it slow, which the Writer encouraged. Pt did tell his peers that he would be praying for them all and volunteered several positive comments during the group.

## 2014-03-03 NOTE — Progress Notes (Addendum)
Patient continues to be manic, talking to self and presenting religious of grandiosity. Patient paces the the hall way and day room interacting with peers and staff. Patient cooperative with medications and treatment. At 1 am, patient became loud, increasing agitated and disruptive to the milieu. Patient refused verbal redirection. Offered and accepted po prn of olanzapine 10 mg for agitation. Will continue to monitor patient.   At 2:30 am, patient became increasingly agitated saying that his room and bathroom is filled with the evil spirit. Patient offered bathroom at the main entrance. Patient refused to go back to his room. Quiet room offered and accepted by the patient. Patient fell asleep as soon as he entered the quiet room. Repeat order of zyprexa 10 mg from Dr. Claudine MoutonAkintoya was hold. Will continue to monitor patient.

## 2014-03-03 NOTE — Progress Notes (Signed)
Patient ID: Jerry Carter, male   DOB: [birth year], 53 y.o.   MRN: [id number] [facility] MD Progress Note  03/03/2014 6:18 PM Jerry Carter  MRN:  [id number]  Subjective:  "I'm fine man, I'm just fine. God will find a way. We are all going to be OK as long as we follow the good book, but people don't want to listen". I'll gather all the elders of much church and we'll come up with a plan to save the black people, well not just them, but really save everyone if we can".   Objective Patient seen and chart reviewed.  Patient continues to be manic ,hyperreligious ,pressured speech,disorganized. Pt reports that he used to be very pushy with the bible, but that he feels he is no longer like that. Pt denies SI, HI, and AVH, but then reports feeling like demons are bothering him in his dreams.       Diagnosis:   DSM5: Primary Psychiatric Diagnosis: Schizoaffective Disorder ,Bipolar type,multiple episodes,now in acute episode  Non psychiatric diagnosis:  Hypertension  Total Time spent with patient: 30 minutes    ADL's:  Intact  Sleep: Poor  Appetite:  Fair  Suicidal Ideation:  Denies  Homicidal Ideation:  Denies   Psychiatric Specialty Exam: Physical Exam  Constitutional: He is oriented to person, place, and time. He appears well-developed and well-nourished.  HENT:  Head: Normocephalic and atraumatic.  Neck: Normal range of motion.  Respiratory: Effort normal.  Musculoskeletal: Normal range of motion.  Neurological: He is alert and oriented to person, place, and time.    Review of Systems  Constitutional: Negative.   HENT: Negative.   Eyes: Negative.   Respiratory: Negative.   Cardiovascular: Negative.   Gastrointestinal: Negative.   Genitourinary: Negative.   Musculoskeletal: Negative.   Skin: Negative.   Neurological: Negative.   Endo/Heme/Allergies: Negative.   Psychiatric/Behavioral: The patient is nervous/anxious (Restless ,agitated) and has insomnia.        Reports  feeling calmer    Blood pressure 141/89, pulse 82, temperature 97.8 F (36.6 C), temperature source Oral, resp. rate 16, height $RemoveBe'5\' 8"'EIwPZKLXS$  (1.727 m), weight 64.864 kg (143 lb).Body mass index is 21.75 kg/(m^2).  General Appearance: Casual  Eye Contact::  Fair  Speech:  pressured  Volume:  Normal  Mood:  Anxious improving  Affect:  Blunt  Thought Process:  Circumstantial, Disorganized, Irrelevant and Tangential  Orientation:  Full (Time, Place, and Person)  Thought Content:  Hallucinations: reports feeling the evil spirit and that he cast him out and hyperreligious  Suicidal Thoughts:  No  Homicidal Thoughts:  No  Memory:  Immediate;   Fair Recent;   Fair Remote;   Fair  Judgement:  Impaired  Insight:  Lacking  Psychomotor Activity:  Increased  Concentration:  Fair  Recall:  [employer] of Knowledge:Fair  Language: Good  Akathisia:  No  Handed:  Right  AIMS (if indicated):   0 -none  Assets:  Leisure Time Physical Health Resilience Social Support  Sleep:  Number of Hours: 4   Musculoskeletal: Strength & Muscle Tone: within normal limits Gait & Station: normal Patient leans: N/A  Current Medications: Current Facility-Administered Medications  Medication Dose Route Frequency Provider Last Rate Last Dose  . acetaminophen (TYLENOL) tablet 650 mg  650 mg Oral Q4H PRN Waylan Boga, NP      . alum & mag hydroxide-simeth (MAALOX/MYLANTA) 200-200-20 MG/5ML suspension 30 mL  30 mL Oral PRN Waylan Boga, NP      .  ARIPiprazole (ABILIFY) tablet 15 mg  15 mg Oral BID PC Mojeed Akintayo   15 mg at 03/03/14 1706  . feeding supplement (ENSURE COMPLETE) (ENSURE COMPLETE) liquid 237 mL  237 mL Oral TID BM Mojeed Akintayo   237 mL at 03/03/14 1401  . gabapentin (NEURONTIN) capsule 100 mg  100 mg Oral TID Ursula Alert, MD   100 mg at 03/03/14 1706  . hydrALAZINE (APRESOLINE) tablet 10 mg  10 mg Oral 3 times per day Theodis Blaze, MD   10 mg at 03/03/14 1425  . hydrochlorothiazide (HYDRODIURIL)  tablet 25 mg  25 mg Oral Daily Waylan Boga, NP   25 mg at 03/03/14 0759  . lisinopril (PRINIVIL,ZESTRIL) tablet 10 mg  10 mg Oral Daily Delfina Redwood, MD   10 mg at 03/03/14 0759  . lithium carbonate capsule 300 mg  300 mg Oral BID WC Mojeed Akintayo   300 mg at 03/03/14 1706  . magnesium hydroxide (MILK OF MAGNESIA) suspension 30 mL  30 mL Oral Daily PRN Waylan Boga, NP      . OLANZapine zydis (ZYPREXA) disintegrating tablet 10 mg  10 mg Oral Q8H PRN Mojeed Akintayo   10 mg at 03/03/14 0759  . ondansetron (ZOFRAN) tablet 4 mg  4 mg Oral Q8H PRN Waylan Boga, NP      . polyethylene glycol (MIRALAX / GLYCOLAX) packet 17 g  17 g Oral Daily Ursula Alert, MD   17 g at 03/03/14 0801  . potassium chloride (K-DUR,KLOR-CON) CR tablet 10 mEq  10 mEq Oral Daily Waylan Boga, NP   10 mEq at 03/03/14 0759  . traZODone (DESYREL) tablet 200 mg  200 mg Oral QHS Mojeed Akintayo   200 mg at 03/02/14 2201    Lab Results:  Results for orders placed during the hospital encounter of 02/16/14 (from the past 48 hour(s))  GLUCOSE, CAPILLARY     Status: None   Collection Time    03/03/14  5:54 AM      Result Value Ref Range   Glucose-Capillary 87  70 - 99 mg/dL  LITHIUM LEVEL     Status: Abnormal   Collection Time    03/03/14  6:25 AM      Result Value Ref Range   Lithium Lvl 0.53 (*) 0.80 - 1.40 mEq/L   Comment: Performed at [place] PANEL     Status: Abnormal   Collection Time    03/03/14  6:25 AM      Result Value Ref Range   Sodium 138  137 - 147 mEq/L   Potassium 4.4  3.7 - 5.3 mEq/L   Chloride 98  96 - 112 mEq/L   CO2 30  19 - 32 mEq/L   Glucose, Bld 92  70 - 99 mg/dL   BUN 18  6 - 23 mg/dL   Creatinine, Ser 1.16  0.50 - 1.35 mg/dL   Calcium 9.1  8.4 - 10.5 mg/dL   GFR calc non Af Amer 69 (*) >90 mL/min   GFR calc Af Amer 80 (*) >90 mL/min   Comment: (NOTE)     The eGFR has been calculated using the CKD EPI equation.     This calculation has not  been validated in all clinical situations.     eGFR's persistently <90 mL/min signify possible Chronic Kidney     Disease.   Anion gap 10  5 - 15   Comment: Performed at [facility]  Physical Findings: AIMS: Facial and Oral Movements Muscles of Facial Expression: None, normal Lips and Perioral Area: None, normal Jaw: None, normal Tongue: None, normal,Extremity Movements Upper (arms, wrists, hands, fingers): None, normal Lower (legs, knees, ankles, toes): None, normal, Trunk Movements Neck, shoulders, hips: None, normal, Overall Severity Severity of abnormal movements (highest score from questions above): None, normal Incapacitation due to abnormal movements: None, normal Patient's awareness of abnormal movements (rate only patient's report): No Awareness, Dental Status Current problems with teeth and/or dentures?: No Does patient usually wear dentures?: No  CIWA:    COWS:     Treatment Plan Summary: Daily contact with patient to assess and evaluate symptoms and progress in treatment Medication management  Assessment and Plan:    For psychosis will continue Abilify 15 mg bid .AIMS - 0 For mood lability will continue lithium at the current dose. Order placed for Li level and BMP (august 15 th Saturday am ).Could increase Li once levels are back and if subtherapeutic. (Depakote discontinued 2/2 low platelets). Will continue gabapentin 100 mg po tid for mood swings/anxiety. Will make available prns for agitation. Will continue trazodone 200mg  at bedtime for sleep. Encouraged to participate in group milieu therapy .   We will monitor his behavior and medication efficacy.   Discussed with patient to drink enough fluid to avoid Li toxicity.  For constipation will add Miralax po daily.  Medical Decision Making Problem Points:  Established problem, worsening (2), New problem, with additional work-up planned (4), Review of last therapy session (1) and Review of  psycho-social stressors (1) Data Points:  Review of medication regiment & side effects (2)  I certify that inpatient services furnished can reasonably be expected to improve the patient's condition.   Guadelupe Sabin C,FNP-BC 03/03/2014, 6:18 PM  I agree with assessment and plan Geralyn Flash A. Sabra Heck, M.D.

## 2014-03-03 NOTE — Progress Notes (Signed)
Patient ID: Jerry Carter, male   DOB: 1961-04-06, 53 y.o.   MRN: [id number] D: Client still animated, euphoric, and hyper religious. "feel great" "reading the bible from Genesis to Revelations" Client does a dance step in the hall. Client talks about different men who he say are religious leaders. Client also reports "they trying to draw me back in but I ain't going, them same sex, men with men and women with women, but I ain't going to do it" "I haven't been with a man in 40 years" A: Writer calm client by listening and redirecting him. Staff will monitor q2315min for safety. R: Client is safe on the unit.

## 2014-03-03 NOTE — Progress Notes (Signed)
Patient ID: Jerry NighJames L Mcwhirter, male   DOB: 1961/01/07, 53 y.o.   MRN: [id number] Psychoeducational Group Note  Date:  03/03/2014 Time:0930am  Group Topic/Focus:  Identifying Needs:   The focus of this group is to help patients identify their personal needs that have been historically problematic and identify healthy behaviors to address their needs.  Participation Level:  Active  Participation Quality:  Redirectable  Affect:  Anxious  Cognitive:  Disorganized and Confused  Insight:  Limited  Engagement in Group:  Limited  Additional Comments:  Healthy coping skills.   Valente DavidWeaver, Ellora Varnum Brooks 03/03/2014,11:08 AM

## 2014-03-04 MED ORDER — LORAZEPAM 2 MG/ML IJ SOLN
2.0000 mg | Freq: Once | INTRAMUSCULAR | Status: AC
  Administered 2014-03-04: 2 mg via INTRAMUSCULAR

## 2014-03-04 MED ORDER — HALOPERIDOL LACTATE 5 MG/ML IJ SOLN
INTRAMUSCULAR | Status: AC
  Administered 2014-03-04: 5 mg via INTRAMUSCULAR
  Filled 2014-03-04: qty 1

## 2014-03-04 MED ORDER — LORAZEPAM 2 MG/ML IJ SOLN
INTRAMUSCULAR | Status: AC
  Administered 2014-03-04: 2 mg via INTRAMUSCULAR
  Filled 2014-03-04: qty 1

## 2014-03-04 MED ORDER — DIPHENHYDRAMINE HCL 50 MG/ML IJ SOLN
50.0000 mg | Freq: Once | INTRAMUSCULAR | Status: AC
  Administered 2014-03-04: 50 mg via INTRAMUSCULAR
  Filled 2014-03-04: qty 1

## 2014-03-04 MED ORDER — HALOPERIDOL LACTATE 5 MG/ML IJ SOLN
5.0000 mg | Freq: Once | INTRAMUSCULAR | Status: AC
  Administered 2014-03-04: 5 mg via INTRAMUSCULAR
  Filled 2014-03-04: qty 1

## 2014-03-04 MED ORDER — DIPHENHYDRAMINE HCL 50 MG/ML IJ SOLN
INTRAMUSCULAR | Status: AC
  Filled 2014-03-04: qty 1

## 2014-03-04 NOTE — Progress Notes (Signed)
The patient emerged from his room a few minutes ago with loud and pressured speech. The patient stated that he refused to return to his bedroom due to the odor and demanded that this author spray his room with air freshener. In addition, he demanded to see this author's "supervisor" since he felt that he was being mistreated (this Thereasa Parkinauthor had been sitting out in the hallway). The patient then began to talk about how he [facility] "[facility] nights [facility] [employer]" and that he spent "forty days" (did not complete the sentence) and that once again he could stand the smell. The patient has been overheard talking loudly to himself, moving furniture, and observed using the restroom on multiple occasions. This author left the dayroom open where he is now sitting by himself.

## 2014-03-04 NOTE — Progress Notes (Signed)
[facility] Group Notes:  (Nursing/MHT/Case Management/Adjunct)  Date:  03/04/2014  Time:  10:34 PM  Type of Therapy:  Group Therapy  Participation Level:  Active  Participation Quality:  Redirectable  Affect:  Excited  Cognitive:  Alert  Insight:  Improving  Engagement in Group:  Engaged  Modes of Intervention:  Discussion  Summary of Progress/Problems: Patient says that he found closure since he has been here. 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 The patient says that he had a breakthrough today. His goal is to leave here and find a place to stay.  [facility] CenterJOHNSON,TAWANA 03/04/2014, 10:34 PM

## 2014-03-04 NOTE — [facility] Group Notes (Signed)
[facility] Group Notes: (Clinical Social Work)   03/04/2014      Type of Therapy:  Group Therapy   Participation Level:  Did Not Attend    Tamiko Leopard Grossman-Orr, LCSW 03/04/2014, 11:31 AM     

## 2014-03-04 NOTE — [facility] Group Notes (Signed)
0900 nursing orientation group  The focus of this group is to educate the patient on the purpose and policies of crisis stabilization and provide a format to answer questions about their admission.  The group details unit policies and expectations of patients while admitted.   Pt did not attend. 

## 2014-03-04 NOTE — [facility] Group Notes (Signed)
[facility] Group Notes:  (Nursing/MHT/Case Management/Adjunct)  Date:  03/04/2014  Time:  11:50 AM  Type of Therapy:  Psychoeducational Skills  Participation Level:  Did Not Attend  Participation Quality:  did not attend  Affect:  did not attend  Cognitive:  did not attend  Insight:  None  Engagement in Group:  did not attend  Modes of Intervention:  did not attend  Summary of Progress/Problems:  Jule SerKent, March Steyer Gail 03/04/2014, 11:50 AM

## 2014-03-04 NOTE — Progress Notes (Signed)
D Pt. Denies SI and HI, no complaints of pain or discomfort noted.  A Writer offered support and encouragement,  Discussed coping skills with pt.  R Pt. Remains safe on the unit,  Pt. Has had several outburst this pm, reporting his bathroom stinks and he is not going to stay in the room.  Staff has cleaned his room with wipes multiple times to calm pt.  Pt. Is religiously preoccupied and does seem to calm when he speaks of the bible. Pt. Is presently resting quietly.

## 2014-03-04 NOTE — Progress Notes (Signed)
Patient ID: Ronelle NighJames L Raatz, male   DOB: 03-10-1961, 53 y.o.   MRN: [id number] D. Fayrene Fearingjames presents with euphoric mood, affect congruent again today. Fayrene FearingJames continues to have very loud, tangential, pressured speech. He is very religiously preoccupied, and becoming increasingly disruptive to the unit milieu. He started yelling in dayroom towards various peers about '' the holy ghost and devil are about you . You are evil like the devil get the evil off of me and out of this place! '' Patient and peer yelling and gesturing with verbal altercation, requiring staff to physically remove patient so that fight would not occur. (peer was also noted to antagonize the patient - and was removed from dayroom.) The patient is fixated that his room is ''evil and contaminated, and needs to be purified. '' A. Notified A. Nwoko of above information and orders received. Patient accepted injections without issue and requested that writer ''purify the room'' . Writer cleaned with sani wipes and patient verbalized understanding that room clean. Discussed with NP that behaviors worsening and patient appears to be decompensating. R .Support, redirection provided.  Patient is safe and is in no acute distress at this time. Will continue to monitor q 15 minutes for safety.

## 2014-03-04 NOTE — Progress Notes (Signed)
Patient ID: Jerry Carter, male   DOB: May 02, 1961, 53 y.o.   MRN: [id number] [facility] MD Progress Note  03/04/2014 12:10 PM Jerry Carter  MRN:  [id number]  Subjective:  "I'm doing better, but I'm just really tired today. I would never hurt myself or anyone else. I just need to keep studying the good book. I just need some rest."   Objective Patient seen and chart reviewed.  Patient continues to be manic ,hyperreligious ,pressured speech,disorganized. Pt reports that he used to be very pushy with the bible, but that he feels he is no longer like that. Pt denies SI, HI, and AVH, but then reports feeling like demons are bothering him in his dreams.       Diagnosis:   DSM5: Primary Psychiatric Diagnosis: Schizoaffective Disorder ,Bipolar type,multiple episodes,now in acute episode  Non psychiatric diagnosis:  Hypertension  Total Time spent with patient: 30 minutes    ADL's:  Intact  Sleep: Poor  Appetite:  Fair  Suicidal Ideation:  Denies  Homicidal Ideation:  Denies   Psychiatric Specialty Exam: Physical Exam  Constitutional: He is oriented to person, place, and time. He appears well-developed and well-nourished.  HENT:  Head: Normocephalic and atraumatic.  Neck: Normal range of motion.  Respiratory: Effort normal.  Musculoskeletal: Normal range of motion.  Neurological: He is alert and oriented to person, place, and time.    Review of Systems  Constitutional: Negative.   HENT: Negative.   Eyes: Negative.   Respiratory: Negative.   Cardiovascular: Negative.   Gastrointestinal: Negative.   Genitourinary: Negative.   Musculoskeletal: Negative.   Skin: Negative.   Neurological: Negative.   Endo/Heme/Allergies: Negative.   Psychiatric/Behavioral: The patient is nervous/anxious (Restless ,agitated) and has insomnia.        Reports feeling calmer    Blood pressure 114/80, pulse 93, temperature 97.5 F (36.4 C), temperature source Oral, resp. rate 16, height $RemoveBe'5\' 8"'cNQlssHGH$   (1.727 m), weight 64.864 kg (143 lb).Body mass index is 21.75 kg/(m^2).  General Appearance: Casual  Eye Contact::  Fair  Speech:  pressured  Volume:  Normal  Mood:  Anxious improving  Affect:  Blunt  Thought Process:  Circumstantial, Disorganized, Irrelevant and Tangential  Orientation:  Full (Time, Place, and Person)  Thought Content:  Hallucinations: reports feeling the evil spirit and that he cast him out and hyperreligious  Suicidal Thoughts:  No  Homicidal Thoughts:  No  Memory:  Immediate;   Fair Recent;   Fair Remote;   Fair  Judgement:  Impaired  Insight:  Lacking  Psychomotor Activity:  Increased  Concentration:  Fair  Recall:  [employer] of Knowledge:Fair  Language: Good  Akathisia:  No  Handed:  Right  AIMS (if indicated):   0 -none  Assets:  Leisure Time Physical Health Resilience Social Support  Sleep:  Number of Hours: 3   Musculoskeletal: Strength & Muscle Tone: within normal limits Gait & Station: normal Patient leans: N/A  Current Medications: Current Facility-Administered Medications  Medication Dose Route Frequency Provider Last Rate Last Dose  . acetaminophen (TYLENOL) tablet 650 mg  650 mg Oral Q4H PRN Waylan Boga, NP      . alum & mag hydroxide-simeth (MAALOX/MYLANTA) 200-200-20 MG/5ML suspension 30 mL  30 mL Oral PRN Waylan Boga, NP      . ARIPiprazole (ABILIFY) tablet 15 mg  15 mg Oral BID PC Mojeed Akintayo   15 mg at 03/04/14 0758  . feeding supplement (ENSURE COMPLETE) (ENSURE COMPLETE) liquid 237 mL  237 mL Oral TID BM Mojeed Akintayo   237 mL at 03/04/14 0758  . gabapentin (NEURONTIN) capsule 100 mg  100 mg Oral TID Ursula Alert, MD   100 mg at 03/04/14 0755  . hydrALAZINE (APRESOLINE) tablet 10 mg  10 mg Oral 3 times per day Theodis Blaze, MD   10 mg at 03/04/14 6213  . hydrochlorothiazide (HYDRODIURIL) tablet 25 mg  25 mg Oral Daily Waylan Boga, NP   25 mg at 03/04/14 0755  . lisinopril (PRINIVIL,ZESTRIL) tablet 10 mg  10 mg Oral  Daily Delfina Redwood, MD   10 mg at 03/04/14 0755  . lithium carbonate capsule 300 mg  300 mg Oral BID WC Mojeed Akintayo   300 mg at 03/04/14 0755  . magnesium hydroxide (MILK OF MAGNESIA) suspension 30 mL  30 mL Oral Daily PRN Waylan Boga, NP      . OLANZapine zydis (ZYPREXA) disintegrating tablet 10 mg  10 mg Oral Q8H PRN Mojeed Akintayo   10 mg at 03/04/14 0755  . ondansetron (ZOFRAN) tablet 4 mg  4 mg Oral Q8H PRN Waylan Boga, NP      . polyethylene glycol (MIRALAX / GLYCOLAX) packet 17 g  17 g Oral Daily Ursula Alert, MD   17 g at 03/03/14 0801  . potassium chloride (K-DUR,KLOR-CON) CR tablet 10 mEq  10 mEq Oral Daily Waylan Boga, NP   10 mEq at 03/04/14 0755  . traZODone (DESYREL) tablet 200 mg  200 mg Oral QHS Mojeed Akintayo   200 mg at 03/03/14 2131    Lab Results:  Results for orders placed during the hospital encounter of 02/16/14 (from the past 48 hour(s))  GLUCOSE, CAPILLARY     Status: None   Collection Time    03/03/14  5:54 AM      Result Value Ref Range   Glucose-Capillary 87  70 - 99 mg/dL  LITHIUM LEVEL     Status: Abnormal   Collection Time    03/03/14  6:25 AM      Result Value Ref Range   Lithium Lvl 0.53 (*) 0.80 - 1.40 mEq/L   Comment: Performed at [place] PANEL     Status: Abnormal   Collection Time    03/03/14  6:25 AM      Result Value Ref Range   Sodium 138  137 - 147 mEq/L   Potassium 4.4  3.7 - 5.3 mEq/L   Chloride 98  96 - 112 mEq/L   CO2 30  19 - 32 mEq/L   Glucose, Bld 92  70 - 99 mg/dL   BUN 18  6 - 23 mg/dL   Creatinine, Ser 1.16  0.50 - 1.35 mg/dL   Calcium 9.1  8.4 - 10.5 mg/dL   GFR calc non Af Amer 69 (*) >90 mL/min   GFR calc Af Amer 80 (*) >90 mL/min   Comment: (NOTE)     The eGFR has been calculated using the CKD EPI equation.     This calculation has not been validated in all clinical situations.     eGFR's persistently <90 mL/min signify possible Chronic Kidney     Disease.   Anion  gap 10  5 - 15   Comment: Performed at [facility]    Physical Findings: AIMS: Facial and Oral Movements Muscles of Facial Expression: None, normal Lips and Perioral Area: None, normal Jaw: None, normal Tongue: None, normal,Extremity Movements Upper (arms, wrists, hands,  fingers): None, normal Lower (legs, knees, ankles, toes): None, normal, Trunk Movements Neck, shoulders, hips: None, normal, Overall Severity Severity of abnormal movements (highest score from questions above): None, normal Incapacitation due to abnormal movements: None, normal Patient's awareness of abnormal movements (rate only patient's report): No Awareness, Dental Status Current problems with teeth and/or dentures?: No Does patient usually wear dentures?: No  CIWA:    COWS:     Treatment Plan Summary: Daily contact with patient to assess and evaluate symptoms and progress in treatment Medication management  Assessment and Plan:    For psychosis will continue Abilify 15 mg bid .AIMS - 0 For mood lability will continue lithium at the current dose. Order placed for Li level and BMP (august 15 th Saturday am ).Could increase Li once levels are back and if subtherapeutic. (Depakote discontinued 2/2 low platelets). Will continue gabapentin 100 mg po tid for mood swings/anxiety. Will make available prns for agitation. Will continue trazodone 200mg  at bedtime for sleep. Encouraged to participate in group milieu therapy .   We will monitor his behavior and medication efficacy.   Discussed with patient to drink enough fluid to avoid Li toxicity.  For constipation will add Miralax po daily.  Medical Decision Making Problem Points:  Established problem, worsening (2), New problem, with additional work-up planned (4), Review of last therapy session (1) and Review of psycho-social stressors (1) Data Points:  Review of medication regiment & side effects (2)  I certify that inpatient services  furnished can reasonably be expected to improve the patient's condition.   Guadelupe Sabin C,FNP-BC 03/04/2014, 12:11 PM  Case discussed, agree with assessment and plan Geralyn Flash A. Sabra Heck, M.D.

## 2014-03-05 MED ORDER — LITHIUM CARBONATE 300 MG PO CAPS
300.0000 mg | ORAL_CAPSULE | Freq: Every morning | ORAL | Status: DC
  Administered 2014-03-06 – 2014-03-08 (×3): 300 mg via ORAL
  Filled 2014-03-05: qty 1
  Filled 2014-03-05: qty 14
  Filled 2014-03-05 (×3): qty 1

## 2014-03-05 MED ORDER — GABAPENTIN 100 MG PO CAPS
200.0000 mg | ORAL_CAPSULE | Freq: Three times a day (TID) | ORAL | Status: DC
  Administered 2014-03-05 – 2014-03-06 (×2): 200 mg via ORAL
  Filled 2014-03-05 (×6): qty 2

## 2014-03-05 MED ORDER — LITHIUM CARBONATE 300 MG PO CAPS
600.0000 mg | ORAL_CAPSULE | Freq: Every evening | ORAL | Status: DC
  Administered 2014-03-05 – 2014-03-07 (×3): 600 mg via ORAL
  Filled 2014-03-05 (×4): qty 2
  Filled 2014-03-05: qty 28

## 2014-03-05 MED ORDER — ZOLPIDEM TARTRATE 5 MG PO TABS
5.0000 mg | ORAL_TABLET | Freq: Every day | ORAL | Status: DC
  Administered 2014-03-05 – 2014-03-07 (×3): 5 mg via ORAL
  Filled 2014-03-05 (×3): qty 1

## 2014-03-05 NOTE — Care Management Utilization Note (Signed)
   Per State Regulation 482.30  This chart was reviewed for necessity with respect to the patient's Admission/ Duration of stay.  Next review date: 03/07/14  Nicolasa Duckingrystal Morrison RN, BSN

## 2014-03-05 NOTE — Progress Notes (Signed)
Patient ID: Jerry NighJames L Castoro, male   DOB: 28-Feb-1961, 53 y.o.   MRN: [id number] D: Client visible on unit in dayroom reading bible, reports day been "interesting, but blessed" client continues to say he wanted to pray for others.Client continues to be hyper religious, hyper verbal. A: Writer provided emotional support, listening to client quote scriptures, redirecting as needed. Staff will monitor q4815min for safety. R: client is safe on the unit, attended group.
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
## 2014-03-05 NOTE — Progress Notes (Signed)
[facility] Group Notes:  (Nursing/MHT/Case Management/Adjunct)  Date:  03/05/2014  Time:  10:35 PM  Type of Therapy:  Group Therapy  Participation Level:  Active  Participation Quality:  Attentive  Affect:  Excited  Cognitive:  Alert  Insight:  Good  Engagement in Group:  Engaged  Modes of Intervention:  Discussion  Summary of Progress/Problems: The patient stated that he had a blessed day, says that he had a conflict earlier but was growing from it. The thing that the patient says that he does when he is well to take care of himself is to live by the word of God.  Percell LocusJOHNSON,TAWANA 03/05/2014, 10:35 PM
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
## 2014-03-05 NOTE — Progress Notes (Signed)
Pt came to the medication window and prior to taking his medications said a prayer. He does appear religiously preoccupied and stated,"I have has a rough life and things have not gone my way. " Pt stated he would put his future in the hands of the lord. Pt has good eye contact. He stated earlier that that he felt there was a demon  In his bathroom and he insisted on moving rooms. He was moved to a different room. Pt does contract for safety and denies SI and HI.

## 2014-03-05 NOTE — Progress Notes (Addendum)
D:  Patient's self inventory sheet, patient sleeps good without sleep medications.  Poor appetite, low energy level.  Denied depression, hopeless, and anxiety.  Denied withdrawals.  Stated he has experienced blurred vision in past 24 hours.  No discharge plans. A:  Medications administered per MD orders.  Emotional support and encouragement given patient. R:  Denied SI and HI.  Denied A/V hallucinations.  Safety maintained with 15 minute checks.  1400   Patient had words with a male patient about religion after lunch.  Patient declined prn medication for agitation several times.

## 2014-03-06 MED ORDER — GABAPENTIN 400 MG PO CAPS
400.0000 mg | ORAL_CAPSULE | Freq: Two times a day (BID) | ORAL | Status: DC
  Administered 2014-03-06 – 2014-03-08 (×4): 400 mg via ORAL
  Filled 2014-03-06 (×5): qty 1
  Filled 2014-03-06: qty 28
  Filled 2014-03-06 (×2): qty 1
  Filled 2014-03-06: qty 28

## 2014-03-06 NOTE — Progress Notes (Signed)
The focus of this group is to educate the patient on the purpose and policies of crisis stabilization and provide a format to answer questions about their admission.  The group details unit policies and expectations of patients while admitted. 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  He was an active participant in the group exercises.  He was calm and was a good listener.

## 2014-03-06 NOTE — Progress Notes (Signed)
D: Pt denies SI/HI/AVH. Pt is pleasant and cooperative. Pt continues to be manic, hyper religious "when God ready for me to leave this place I will leave"   A: Pt was offered support and encouragement. Pt was given scheduled medications. Pt was encourage to attend groups. Q 15 minute checks were done for safety.   R:Pt attends groups and interacts well with peers and staff. Pt is taking medication.Pt receptive to treatment and safety maintained on unit.

## 2014-03-06 NOTE — Progress Notes (Signed)
D Pt. Denies A and VH but continues to pace the floor and appears to be responding to stimuli,  Pt. Denies SI and HI, no complaints of pain or discomfort noted.  A Writer offered support and encouragement.  R Pt. Remains safe on the unit. Pt. Continues to carry his Bible and speak of the accomplishments he has made and [facility]  Pt. Has pressured speech as he speaks of the wealth he intends to bring in and the work he will do in Gods name, pt. Sings and quotes scriptures.

## 2014-03-06 NOTE — [facility] Group Notes (Signed)
[facility] LCSW Group Therapy  03/06/2014 , 3:21 PM   Type of Therapy:  Group Therapy  Participation Level:  Active  Participation Quality:  Attentive  Affect:  Appropriate  Cognitive:  Alert  Insight:  Improving  Engagement in Therapy:  Engaged  Modes of Intervention:  Discussion, Exploration and Socialization  Summary of Progress/Problems: Today's group focused on the term Diagnosis.  Participants were asked to define the term, and then pronounce whether it is a negative, positive or neutral term.  Talked about his diagnosis of Schizophrenia, how he believes he can be healed of it and started quoting Bible verses.  I cut him off.  He was a good sport about it.  Unlike a peer, he rejects accepting his diagnosis because he is "claiming the healing that comes with the word of God."  Jerry Carter, Jerry Carter 03/06/2014 , 3:21 PM

## 2014-03-06 NOTE — Progress Notes (Signed)
Patient ID: Jerry NighJames L Quincy, male   DOB: [birth year], 53 y.o.   MRN: [id number] D- Patient reports he slept good and his appetite is good.  His energy level is "Hyper" per patient but he appears calmer today.  He is rating depression, hopelessness and anxiety at 0 today.  A- supported patient.  R- patient seen by [employer].  He told her that  he is working well with treatment team and feels this is a good place for him at this time.  He continues to be religiously focused and reads from the bible frequently. He prays over medications before taking them.
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
## 2014-03-07 NOTE — Tx Team (Signed)
  Interdisciplinary Treatment Plan Update   Date Reviewed:  03/07/2014  Time Reviewed:  1:41 PM  Progress in Treatment:   Attending groups: Yes Participating in groups: Yes Taking medication as prescribed: Yes  Tolerating medication: Yes Family/Significant other contact made: Yes  Patient understands diagnosis: Yes  Discussing patient identified problems/goals with staff: Yes Medical problems stabilized or resolved: Yes Denies suicidal/homicidal ideation: Yes Patient has not harmed self or others: Yes  For review of initial/current patient goals, please see plan of care.  Estimated Length of Stay:  D/C tomorrow  Reason for Continuation of Hospitalization:   New Problems/Goals identified:  N/A  Discharge Plan or Barriers:   return to community with support of faith group, and [facility]  Additional Comments:  Attendees:  Signature: Thedore MinsMojeed Akintayo, MD 03/07/2014 1:41 PM   Signature: Richelle Itood Tiffani Kadow, LCSW 03/07/2014 1:41 PM  Signature: Fransisca KaufmannLaura Davis, NP 03/07/2014 1:41 PM  Signature: Joslyn Devonaroline Beaudry, RN 03/07/2014 1:41 PM  Signature: Liborio NixonPatrice White, RN 03/07/2014 1:41 PM  Signature:  03/07/2014 1:41 PM  Signature:   03/07/2014 1:41 PM  Signature:    Signature:    Signature:    Signature:    Signature:    Signature:      Scribe for Treatment Team:   Richelle Itood Lejuan Botto, LCSW  03/07/2014 1:41 PM

## 2014-03-07 NOTE — Progress Notes (Signed)
Pt still remains hyper religious reading bible versus to the staff. Pt denies SI and HI and contracts for safety. He is very pleasant and well groomed. He denies any aud or visual hallucinations and contracts for safety. Pt has been attending groups. He stated his anxiety and depression are 0/10. Pt stated he is preparing to leave. Pt wants to continue to work and stay organized.

## 2014-03-07 NOTE — [facility] Group Notes (Signed)
[facility]  03/07/2014 , 12:59 PM    Type of Therapy:  [employer] Presentation  Participation Level:  Active  Participation Quality:  Attentive  Affect:  Blunted  Cognitive:  Oriented  Insight:  Limited  Engagement in Therapy:  Engaged  Modes of Intervention:  Discussion, Education and Socialization  Summary of Progress/Problems:  Onalee HuaDavid from [employer] came to present his recovery story and play the guitar.  Sat quietly through group.  Appeared to be sleeping for much of the time.  [name], Blayne Garlick B 03/07/2014 , 12:59 PM

## 2014-03-07 NOTE — [facility] Suicide Risk Assessment (Signed)
[facility] INPATIENT:  Family/Significant Other Suicide Prevention Education  Suicide Prevention Education:  Education Completed; No one has been identified by the patient as the family member/significant other with whom the patient will be residing, and identified as the person(s) who will aid the patient in the event of a mental health crisis (suicidal ideations/suicide attempt).  With written consent from the patient, the family member/significant other has been provided the following suicide prevention education, prior to the and/or following the discharge of the patient.  The suicide prevention education provided includes the following:  Suicide risk factors  Suicide prevention and interventions  National Suicide Hotline telephone number  [facility] assessment telephone number  [facility] Assistance 911  [facility] and/or [facility]idential Mobile Crisis Unit telephone number  Request made of family/significant other to:  Remove weapons (e.g., guns, rifles, knives), all items previously/currently identified as safety concern.    Remove drugs/medications (over-the-counter, prescriptions, illicit drugs), all items previously/currently identified as a safety concern.  The family member/significant other verbalizes understanding of the suicide prevention education information provided.  The family member/significant other agrees to remove the items of safety concern listed above. The patient did not endorse SI at the time of admission, nor did the patient c/o SI during the stay here.  SPE not required.   [name], Crysta Gulick B 03/07/2014, 1:51 PM

## 2014-03-07 NOTE — Progress Notes (Signed)
[facility] MD Progress Note  03/07/2014 11:18 AM Jerry Carter  MRN:  [id number]  Subjective:  "I am doing good, I am going to a get a job and get married once I get out of here".  Objective Patient seen and chart reviewed.  Patient appears to be well groomed today, wearing casual pants and shirt ,pleasant. Reports he feels better. Denies SI/AH/VH/HI. Denies any side effects of Li or his other meds. Reports that he stays hydrated. Reports that he will try to get a job at [employer] and try to get married to sweet heart who is a Financial controller.  Per staff patient patient continues to be hyperreligious. He slept well last night. No outburst reported.    Diagnosis:   DSM5: Primary Psychiatric Diagnosis: Schizoaffective Disorder ,Bipolar type,multiple episodes,now in acute episode  Non psychiatric diagnosis: Hypertension  Total Time spent with patient: 30 minutes    ADL's:  Intact  Sleep: Good  Appetite:  Good  Suicidal Ideation:  Denies  Homicidal Ideation:  Denies   Psychiatric Specialty Exam: Physical Exam  Constitutional: He is oriented to person, place, and time. He appears well-developed and well-nourished.  HENT:  Head: Normocephalic and atraumatic.  Neck: Normal range of motion.  Respiratory: Effort normal.  Musculoskeletal: Normal range of motion.  Neurological: He is alert and oriented to person, place, and time.  Psychiatric: His behavior is normal.    Review of Systems  Constitutional: Negative.   HENT: Negative.   Eyes: Negative.   Respiratory: Negative.   Cardiovascular: Negative.   Gastrointestinal: Negative.   Genitourinary: Negative.   Musculoskeletal: Negative.   Skin: Negative.   Neurological: Negative.   Endo/Heme/Allergies: Negative.   Psychiatric/Behavioral: Nervous/anxious: improving.        Hyperreligious,pressured speech,mood swings (improving)    Blood pressure 110/70, pulse 91, temperature 97.5 F (36.4 C), temperature source Oral,  resp. rate 18, height 5\' 8"  (1.727 m), weight 64.864 kg (143 lb).Body mass index is 21.75 kg/(m^2).  General Appearance: Casual  Eye Contact::  Good  Speech:  pressured  Volume:  Normal  Mood:  Anxious improving  Affect:  Blunt  Thought Process:  Circumstantial and more goal directed  Orientation:  Full (Time, Place, and Person)  Thought Content:  hyperreligious, denies any hallucinations or paranoia  Suicidal Thoughts:  No  Homicidal Thoughts:  No  Memory:  Immediate;   Fair Recent;   Fair Remote;   Fair  Judgement:  Impaired  Insight:  Lacking  Psychomotor Activity:  Increased  Concentration:  Fair  Recall:  [employer] of Knowledge:Fair  Language: Good  Akathisia:  No  Handed:  Right  AIMS (if indicated):   0 -none  Assets:  Leisure Time Physical Health Resilience Social Support  Sleep:  Number of Hours: 4.25   Musculoskeletal: Strength & Muscle Tone: within normal limits Gait & Station: normal Patient leans: N/A  Current Medications: Current Facility-Administered Medications  Medication Dose Route Frequency Provider Last Rate Last Dose  . acetaminophen (TYLENOL) tablet 650 mg  650 mg Oral Q4H PRN Nanine Means, NP      . alum & mag hydroxide-simeth (MAALOX/MYLANTA) 200-200-20 MG/5ML suspension 30 mL  30 mL Oral PRN Nanine Means, NP      . ARIPiprazole (ABILIFY) tablet 15 mg  15 mg Oral BID PC Mojeed Akintayo   15 mg at 03/07/14 0823  . feeding supplement (ENSURE COMPLETE) (ENSURE COMPLETE) liquid 237 mL  237 mL Oral TID BM Mojeed Akintayo   237  mL at 03/07/14 0823  . gabapentin (NEURONTIN) capsule 400 mg  400 mg Oral BID Jomarie LongsSaramma Ivionna Verley, MD   400 mg at 03/07/14 [id number]  . hydrALAZINE (APRESOLINE) tablet 10 mg  10 mg Oral 3 times per day Dorothea OgleIskra M Myers, MD   10 mg at 03/07/14 0824  . hydrochlorothiazide (HYDRODIURIL) tablet 25 mg  25 mg Oral Daily Nanine MeansJamison Lord, NP   25 mg at 03/07/14 0824  . lisinopril (PRINIVIL,ZESTRIL) tablet 10 mg  10 mg Oral Daily Christiane Haorinna L Sullivan, MD    10 mg at 03/07/14 [id number]  . lithium carbonate capsule 300 mg  300 mg Oral q morning - 10a Jomarie LongsSaramma Lizzett Nobile, MD   300 mg at 03/07/14 0824  . lithium carbonate capsule 600 mg  600 mg Oral QPM Orli Degrave, MD   600 mg at 03/06/14 1710  . magnesium hydroxide (MILK OF MAGNESIA) suspension 30 mL  30 mL Oral Daily PRN Nanine MeansJamison Lord, NP      . OLANZapine zydis (ZYPREXA) disintegrating tablet 10 mg  10 mg Oral Q8H PRN Mojeed Akintayo   10 mg at 03/04/14 2104  . ondansetron (ZOFRAN) tablet 4 mg  4 mg Oral Q8H PRN Nanine MeansJamison Lord, NP      . polyethylene glycol (MIRALAX / GLYCOLAX) packet 17 g  17 g Oral Daily Jomarie LongsSaramma Izzah Pasqua, MD   17 g at 03/06/14 0803  . potassium chloride (K-DUR,KLOR-CON) CR tablet 10 mEq  10 mEq Oral Daily Nanine MeansJamison Lord, NP   10 mEq at 03/07/14 0824  . zolpidem (AMBIEN) tablet 5 mg  5 mg Oral QHS Jomarie LongsSaramma Dnyla Antonetti, MD   5 mg at 03/06/14 2202    Lab Results:  No results found for this or any previous visit (from the past 48 hour(s)).  Physical Findings: AIMS: Facial and Oral Movements Muscles of Facial Expression: None, normal Lips and Perioral Area: None, normal Jaw: None, normal Tongue: None, normal,Extremity Movements Upper (arms, wrists, hands, fingers): None, normal Lower (legs, knees, ankles, toes): None, normal, Trunk Movements Neck, shoulders, hips: None, normal, Overall Severity Severity of abnormal movements (highest score from questions above): None, normal Incapacitation due to abnormal movements: None, normal Patient's awareness of abnormal movements (rate only patient's report): No Awareness, Dental Status Current problems with teeth and/or dentures?: No Does patient usually wear dentures?: No  CIWA:  CIWA-Ar Total: 1 COWS:  COWS Total Score: 1  Treatment Plan Summary: Daily contact with patient to assess and evaluate symptoms and progress in treatment Medication management  Assessment and Plan:    For psychosis will continue Abilify as scheduled. For mood lability  will continue Lithium 900 mg daily (could get another Li level on Friday). Will make available prns for agitation. Will continue gabapentin 400 mg po bid for anxiety,mood swings. Will continue Ambien 5 mg  at bedtime for sleep. Encouraged to participate in group milieu therapy .   We will monitor his behavior and medication efficacy.   Discussed with patient to drink enough fluid to avoid Li toxicity.  For HTN will continue his medication regimen as scheduled of hydralazine 10 mg po tid,hydrodiuril 25 mg daily and lisinopril 10 mg daily (good control on this regimen)  CSW working on disposition.  Medical Decision Making Problem Points:  Established problem, stable/improving (1), Review of last therapy session (1) and Review of psycho-social stressors (1) Data Points:  Review of medication regiment & side effects (2)  I certify that inpatient services furnished can reasonably be expected to improve the patient's condition.  Kong Packett, 03/07/2014, 11:18 AM

## 2014-03-07 NOTE — Progress Notes (Signed)
D Pt.denies SI and HI,  Pt. Denies A and VH.  No complaints of pain or discomfort.  A Writer offered support and encouragement, discussed discharge plans with pt.  R Pt. Remains safe on the unit,  Pt. 's speech remains pressured but slower and calmer than previous days.  Pt is somewhat grandiose, but again improved from previous days. Pt. Is dressed and groomed today which is an improvement and is able to remain quietly in his room while reading his Bible.

## 2014-03-07 NOTE — [facility] Group Notes (Signed)
[facility]   03/07/2014 10:19 AM  Participation Quality:  Engaged  Mood/Affect:  Excited  Depression Rating:  denies  Anxiety Rating:  Denies  "I am turning it all over to God instead of relying on man."  Thoughts of Suicide:  No Will you contract for safety?   NA  Current AVH:  No  Plan for Discharge/Comments:  Fayrene FearingJames' mood is more stable today.  We called Mr Janee Mornhompson today after group.  He is working on a plan, and he and I agreed to talk again at 4 to find out about progress.  Back up plan is to go to extended stay hotel.  Transportation Means: church family  Supports: church family  [place], [place] B

## 2014-03-07 NOTE — BHH Group Notes (Signed)
Adult Psychoeducational Group Note  Date:  03/07/2014 Time:  9:44 PM  Group Topic/Focus:  Wrap-Up Group:   The focus of this group is to help patients review their daily goal of treatment and discuss progress on daily workbooks.  Participation Level:  Active  Participation Quality:  Appropriate  Affect:  Flat  Cognitive:  Delusional  Insight: Lacking  Engagement in Group:  Engaged  Modes of Intervention:  Discussion  Additional Comments:  Jerry FearingJames said his day was fabulous, he's going home tomorrow.  He also stated he was here not only to get help but to complete an assignment.  He was very hyper-religious.  He stated he used prayer and fasting as coping skills.  Jerry RancherLindsay, Jerry Carter 03/07/2014, 9:44 PM

## 2014-03-07 NOTE — Care Management Utilization Note (Signed)
   Per State Regulation 482.30  This chart was reviewed for necessity with respect to the patient's Admission/ Duration of stay.  Next review date: 03/10/14  Nicolasa Duckingrystal Morrison RN, BSN

## 2014-03-08 MED ORDER — LISINOPRIL 10 MG PO TABS: 10.0000 mg | ORAL_TABLET | Freq: Every day | ORAL | Status: DC

## 2014-03-08 MED ORDER — LITHIUM CARBONATE 600 MG PO CAPS: 600.0000 mg | ORAL_CAPSULE | Freq: Every evening | ORAL | Status: DC

## 2014-03-08 MED ORDER — ARIPIPRAZOLE 15 MG PO TABS: 15.0000 mg | ORAL_TABLET | Freq: Two times a day (BID) | ORAL | Status: DC

## 2014-03-08 MED ORDER — LITHIUM CARBONATE 300 MG PO CAPS: 300.0000 mg | ORAL_CAPSULE | Freq: Every morning | ORAL | Status: DC

## 2014-03-08 MED ORDER — GABAPENTIN 400 MG PO CAPS: 400.0000 mg | ORAL_CAPSULE | Freq: Two times a day (BID) | ORAL | Status: DC

## 2014-03-08 MED ORDER — HYDROCHLOROTHIAZIDE 25 MG PO TABS: 25.0000 mg | ORAL_TABLET | Freq: Every day | ORAL | Status: DC

## 2014-03-08 MED ORDER — ZOLPIDEM TARTRATE 5 MG PO TABS: 5.0000 mg | ORAL_TABLET | Freq: Every day | ORAL | Status: DC

## 2014-03-08 NOTE — Progress Notes (Signed)
[facility] :  Will you be returning to the same living situation after discharge: No. At discharge, do you have transportation home?:Yes,  friend Do you have the ability to pay for your medications:Yes,  MCD  Release of information consent forms completed and in the chart;  Patient's signature needed at discharge.  Patient to Follow up at: Follow-up Information   Follow up with [facility]. (Go to the walk-in clinic M-F between 8 and9 AM for your hosptial follow up appointment)    Contact information:   [address]  [place]  [phone]      Patient denies SI/HI:   Yes,  yes    Safety Planning and Suicide Prevention discussed:  Yes,  yes  [name], Pearl Berlinger B 03/08/2014, 8:18 AM

## 2014-03-08 NOTE — Progress Notes (Signed)
Pt d/c from the hospital with a friend. All items returned. D/C instructions given by day nurse and again by this Clinical research associatewriter. Prescriptions and samples given. Pt denies si and hi.

## 2014-03-08 NOTE — [facility] Group Notes (Signed)
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  Salih Williamson E 03/08/2014, 11:36 AM

## 2014-03-08 NOTE — Progress Notes (Signed)
Patient ID: Jerry Carter, male   DOB: 1961/06/08, 53 y.o.   MRN: [id number]  D: Pt. Denies SI/HI and A/V Hallucinations. Patient does not report any pain or discomfort at this time. Writer went over discharge and medication instructions with patient. Patient verbalized understanding of both. Patient reports that he feels ready to go.   A: Support and encouragement provided to the patient. Scheduled medications given to patient per physician's orders.  R: Patient is receptive and cooperative with Clinical research associatewriter. Patient is seen in the milieu often and has been attending groups. Q15 minute checks are maintained for safety.

## 2014-03-08 NOTE — Progress Notes (Signed)
Patient ID: Jerry Carter, male   DOB: 17-Jan-1961, 53 y.o.   MRN: [id number] D: Patient excited about discharging tomorrow. Pt reported not sure where he will go. Pt is still religiously preoccupied witting bible verses. 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 Pt denies suicidal /homicidal ideation intent and plan. Pt denies auditory and visual hallucination. Cooperative with assessment. No acute distressed noted at this time.   A: Met with pt 1:1. Medications administered as prescribed. Writer encouraged pt to discuss feelings. Pt encouraged to come to staff with any questions or concerns.   R: Patient is safe on the unit. He is complaint with medications and denies any adverse reaction. Continue current POC.

## 2014-03-08 NOTE — BHH Suicide Risk Assessment (Signed)
   Demographic Factors:  Male, Low socioeconomic status and Unemployed  Total Time spent with patient: 20 minutes  Psychiatric Specialty Exam: Physical Exam  Constitutional: He is oriented to person, place, and time. He appears well-developed and well-nourished.  HENT:  Head: Normocephalic.  Neck: Normal range of motion.  Cardiovascular: Normal rate.   Respiratory: Effort normal.  Neurological: He is alert and oriented to person, place, and time.  Skin: Skin is warm.  Psychiatric:  Mood improved , continues to have pressured speech but more goal directed,less disorganized    Review of Systems  Constitutional: Negative.   HENT: Negative.   Eyes: Negative.   Genitourinary: Negative.   Musculoskeletal: Negative.   Skin: Negative.   Psychiatric/Behavioral: Negative.     Blood pressure 131/80, pulse 96, temperature 98 F (36.7 C), temperature source Oral, resp. rate 18, height 5\' 8"  (1.727 m), weight 64.864 kg (143 lb).Body mass index is 21.75 kg/(m^2).  General Appearance: Casual  Eye Contact::  Fair  Speech:  Pressured and improving  Volume:  Normal  Mood:  Euthymic  Affect:  Congruent  Thought Process:  less disorganized,more goal directed  Orientation:  Full (Time, Place, and Person)  Thought Content:  denies AH/VH ,But reports a spiritual warfare that is going on,continues to be religious  Suicidal Thoughts:  No  Homicidal Thoughts:  No  Memory:  Immediate;   Fair Recent;   Fair Remote;   Fair  Judgement:  Fair  Insight:  Fair  Psychomotor Activity:  Normal  Concentration:  Fair  Recall:  [employer]  Fund of Knowledge:Fair  Language: Fair  Akathisia:  No    AIMS (if indicated):   0  Assets:  Communication Skills Desire for Improvement Social Support  Sleep:  Number of Hours: 2    Musculoskeletal: Strength & Muscle Tone: within normal limits Gait & Station: normal Patient leans: N/A   Mental Status Per Nursing Assessment::   On Admission:  NA  Current  Mental Status by Physician: denies ah/vh/si/hi ,continues to be hyperreligious   Loss Factors: Financial problems/change in socioeconomic status  Historical Factors: Impulsivity  Risk Reduction Factors:   Religious beliefs about death and Positive social support  Continued Clinical Symptoms:  Schizoaffective disorder ,continue outpatient care  Cognitive Features That Contribute To Risk:  Polarized thinking    Suicide Risk:  Minimal: No identifiable suicidal ideation.  Patients presenting with no risk factors but with morbid ruminations; may be classified as minimal risk based on the severity of the depressive symptoms  Discharge Diagnoses:  Primary Psychiatric Diagnosis: Schizoaffective Disorder ,Bipolar type,multiple episodes,now in acute episode   Non psychiatric diagnosis: Hypertension   Past Medical History  Diagnosis Date  . Hypertension   . Bipolar disorder     Plan Of Care/Follow-up recommendations:  Activity:  No restrictions,follow up with outpatient care  Is patient on multiple antipsychotic therapies at discharge:  No   Has Patient had three or more failed trials of antipsychotic monotherapy by history:  No  Recommended Plan for Multiple Antipsychotic Therapies: NA    Damaria Stofko 03/08/2014, 10:12 AM

## 2014-03-12 NOTE — Progress Notes (Signed)
Patient Discharge Instructions:  After Visit Summary (AVS):   Faxed to:  03/12/14 Psychiatric Admission Assessment Note:   Faxed to:  03/12/14 Suicide Risk Assessment - Discharge Assessment:   Faxed to:  03/12/14 Faxed/Sent to the Next Level Care provider:  03/12/14 Faxed to [facility] @ [phone]  Jerelene Redden, 03/12/2014, 4:01 PM

## 2014-04-04 NOTE — Discharge Summary (Signed)
Physician Discharge Summary Note  Patient:  Jerry Carter is an 53 y.o., male MRN:  [id number] DOB:  [birth year] Patient phone:  [phone] (home)  Patient address:   [address] [place] [address],  Total Time spent with patient: 30 minutes  Date of Admission:  02/16/2014 Date of Discharge: 03/08/2014  Reason for Admission:  Delusional, Mania  Discharge Diagnoses: Principal Problem:   Schizoaffective disorder Active Problems:   Hypertension   Psychiatric Specialty Exam: Physical Exam  Review of Systems  Constitutional: Negative.   HENT: Negative.   Eyes: Negative.   Respiratory: Negative.   Cardiovascular: Negative.   Gastrointestinal: Negative.   Genitourinary: Negative.   Musculoskeletal: Negative.   Skin: Negative.   Neurological: Negative.   Endo/Heme/Allergies: Negative.   Psychiatric/Behavioral: Positive for depression. The patient is nervous/anxious.     Blood pressure 140/76, pulse 81, temperature 98 F (36.7 C), temperature source Oral, resp. rate 18, height  (1.727 m), weight 64.864 kg (143 lb).Body mass index is 21.75 kg/(m^2).   General Appearance: Casual   Eye Contact:: Fair   Speech: Pressured and improving   Volume: Normal   Mood: Euthymic   Affect: Congruent   Thought Process: less disorganized,more goal directed   Orientation: Full (Time, Place, and Person)   Thought Content: denies AH/VH ,But reports a spiritual warfare that is going on,continues to be religious   Suicidal Thoughts: No   Homicidal Thoughts: No   Memory: Immediate; Fair  Recent; Fair  Remote; Fair   Judgement: Fair   Insight: Fair   Psychomotor Activity: Normal   Concentration: Fair   Recall: [employer] of Knowledge:Fair   Language: Fair   Akathisia: No     AIMS (if indicated): 0   Assets: Communication Skills  Desire for Improvement  Social Support   Sleep: Number of Hours: 2    Musculoskeletal:  Strength & Muscle Tone: within normal limits  Gait  & Station: normal  Patient leans: N/A   DSM5:  Schizophrenia Disorders: Schizoaffective disorder,Bipolar type ,multiple episodes ,currently in acute episode (improved) Obsessive-Compulsive Disorders: NA  Trauma-Stressor Disorders: NA  Substance/Addictive Disorders: Patient denies using any drugs or alcohol in the past 2-3 years  Depressive Disorders: NA, Patient also denies feeling depressed  AXIS I:Schizoaffective disorder,Bipolar type ,multiple episodes ,currently in acute episode (improved  AXIS II: Deferred  AXIS III:  Past Medical History   Diagnosis  Date   .  Hypertension     AXIS IV: housing problems, occupational problems, other psychosocial or environmental problems, problems related to social environment and problems with primary support group  AXIS V: Moderate Symptoms   Level of Care:  OP  Hospital Course:  AA male, 53 years old was admitted from the ER yesterday for Psychotic symptoms and not taking his medications. Patient was brought in by his pastor who felt patient needed to be on his medications after threatening Church members. Patient today, stated he does not need medications but the voice of God is healing him. Patient was very tangential, perseverating on religion and quoting the Bible. Patient states he stopped taking his medications because "God has healed him of his Schizophrenia" Patient reports he is homeless because he was living with "sinners committing crimes" so he left the house. Patient reported poor sleep and poor appetite and stated that "if you believe in God, you do not need too much food" Patient was redirected several times back to the topic in discussion. Patient denied SI/HI at  this time but admits to 3 previous suicide attempt by OD on pills as a young teenager. Patient admits to hearing spiritual voices and the voice of God at times. Patient stated that he has a Bachelor's degree in education and he plans to marry his new girl friend. Patient had  [facility] as his outpatient provider but stopped seeing them since Feb.3rd because he did not think he needed them. We have accepted him for admission and will start patient on antipsychotic to manage his mood. He will be receiving his scheduled medication and PRN as needed for agitation. Discharge planning will include looking for a stable accomodation for patient and an outpatient Psychiatric facility.  During Hospitalization: Medications managed, psychoeducation, group and individual therapy. Pt currently denies SI, HI, and Psychosis. At discharge, pt rates anxiety and depression as minimal yet continues to exhibit anxiety when he speaks about religious topics, nearly incessantly. Pt states that he does have a good supportive home environment and will followup with outpatient treatment. Affirms agreement with medication regimen and discharge plan. Denies other physical and psychological concerns at time of discharge.   Consults:  None  Significant Diagnostic Studies:  None  Discharge Vitals:   Blood pressure 140/76, pulse 81, temperature 98 F (36.7 C), temperature source Oral, resp. rate 18, height  (1.727 m), weight 64.864 kg (143 lb). Body mass index is 21.75 kg/(m^2). Lab Results:   No results found for this or any previous visit (from the past 72 hour(s)).  Physical Findings: AIMS: Facial and Oral Movements Muscles of Facial Expression: None, normal Lips and Perioral Area: None, normal Jaw: None, normal Tongue: None, normal,Extremity Movements Upper (arms, wrists, hands, fingers): None, normal Lower (legs, knees, ankles, toes): None, normal, Trunk Movements Neck, shoulders, hips: None, normal, Overall Severity Severity of abnormal movements (highest score from questions above): None, normal Incapacitation due to abnormal movements: None, normal Patient's awareness of abnormal movements (rate only patient's report): No Awareness, Dental Status Current problems with teeth and/or  dentures?: No Does patient usually wear dentures?: No  CIWA:  CIWA-Ar Total: 1 COWS:  COWS Total Score: 1  Psychiatric Specialty Exam: See Psychiatric Specialty Exam and Suicide Risk Assessment completed by Attending Physician prior to discharge.  Discharge destination:  Home  Is patient on multiple antipsychotic therapies at discharge:  No   Has Patient had three or more failed trials of antipsychotic monotherapy by history:  No  Recommended Plan for Multiple Antipsychotic Therapies: NA  Discharge Instructions   Diet - low sodium heart healthy    Complete by:  As directed      Discharge instructions    Complete by:  As directed   Take all of your medications as directed. Be sure to keep all of your follow up appointments.  If you are unable to keep your follow up appointment, call your Doctor's office to let them know, and reschedule.  Make sure that you have enough medication to last until your appointment. Be sure to get plenty of rest. Going to bed at the same time each night will help. Try to avoid sleeping during the day.  Increase your activity as tolerated. Regular exercise will help you to sleep better and improve your mental health. Eating a heart healthy diet is recommended. Try to avoid salty or fried foods. Be sure to avoid all alcohol and illegal drugs.     Increase activity slowly    Complete by:  As directed  Medication List       Indication   ARIPiprazole 15 MG tablet  Commonly known as:  ABILIFY  Take 1 tablet (15 mg total) by mouth 2 (two) times daily after a meal.   Indication:  Manic Phase of Manic-Depression, mood stabilization     gabapentin 400 MG capsule  Commonly known as:  NEURONTIN  Take 1 capsule (400 mg total) by mouth 2 (two) times daily.   Indication:  mood stabilization     hydrochlorothiazide 25 MG tablet  Commonly known as:  HYDRODIURIL  Take 1 tablet (25 mg total) by mouth daily.   Indication:  High Blood Pressure      lisinopril 10 MG tablet  Commonly known as:  PRINIVIL,ZESTRIL  Take 1 tablet (10 mg total) by mouth daily.   Indication:  High Blood Pressure     lithium carbonate 300 MG capsule  Take 1 capsule (300 mg total) by mouth every morning.   Indication:  Manic Phase of Manic-Depression     lithium 600 MG capsule  Take 1 capsule (600 mg total) by mouth every evening.   Indication:  Manic Phase of Manic-Depression     zolpidem 5 MG tablet  Commonly known as:  AMBIEN  Take 1 tablet (5 mg total) by mouth at bedtime.   Indication:  Trouble Sleeping           Follow-up Information   Follow up with [facility]. (Go to the walk-in clinic M-F between 8 and9 AM for your hosptial follow up appointment)    Contact information:   [address]  [place]  [[phone]      Follow-up recommendations:  Activity:  As tolerated Diet:  Heart healthy with low sodium.  Comments:   Take all medications as prescribed. Keep all follow-up appointments as scheduled.  Do not consume alcohol or use illegal drugs while on prescription medications. Report any adverse effects from your medications to your primary care provider promptly.  In the event of recurrent symptoms or worsening symptoms, call 911, a crisis hotline, or go to the nearest emergency department for evaluation.   Total Discharge Time:  Greater than 30 minutes.  Signed: Beau Fanny, FNP-BC 03/08/2014, 1:28 PM    Patient was seen face to face for psychiatric evaluation, suicide risk assessment and case discussed with treatment team and NP and made appropriate disposition plans. Reviewed the information documented and agree with the treatment plan.     Jomarie Longs ,MD Attending Psychiatrist  [facility]

## 2014-05-02 ENCOUNTER — Encounter (HOSPITAL_COMMUNITY): Payer: Self-pay | Admitting: Emergency Medicine

## 2014-05-02 ENCOUNTER — Emergency Department (HOSPITAL_COMMUNITY): Payer: Medicare Other

## 2014-05-02 ENCOUNTER — Emergency Department (HOSPITAL_COMMUNITY)
Admission: EM | Admit: 2014-05-02 | Discharge: 2014-05-03 | Disposition: A | Discharge status: Home / Self Care | Payer: Medicare Other | Attending: Emergency Medicine | Admitting: Emergency Medicine

## 2014-05-02 DIAGNOSIS — F319 Bipolar disorder, unspecified: Secondary | ICD-10-CM | POA: Diagnosis not present

## 2014-05-02 DIAGNOSIS — I10 Essential (primary) hypertension: Secondary | ICD-10-CM | POA: Insufficient documentation

## 2014-05-02 DIAGNOSIS — Z79899 Other long term (current) drug therapy: Secondary | ICD-10-CM | POA: Insufficient documentation

## 2014-05-02 DIAGNOSIS — L03012 Cellulitis of left finger: Secondary | ICD-10-CM | POA: Insufficient documentation

## 2014-05-02 DIAGNOSIS — L02512 Cutaneous abscess of left hand: Secondary | ICD-10-CM

## 2014-05-02 NOTE — ED Notes (Signed)
Pt. reports left ring finger infection with swelling onset this week , denies injury  , no fever or chills.

## 2014-05-03 ENCOUNTER — Telehealth (HOSPITAL_COMMUNITY): Payer: Self-pay | Admitting: *Deleted

## 2014-05-03 MED ORDER — DOXYCYCLINE HYCLATE 100 MG PO CAPS: 100.0000 mg | ORAL_CAPSULE | Freq: Two times a day (BID) | ORAL | Status: DC

## 2014-05-03 MED ORDER — TRAMADOL HCL 50 MG PO TABS: 50.0000 mg | ORAL_TABLET | Freq: Four times a day (QID) | ORAL | Status: DC | PRN

## 2014-05-03 MED ORDER — CEPHALEXIN 500 MG PO CAPS: 500.0000 mg | ORAL_CAPSULE | Freq: Four times a day (QID) | ORAL | Status: DC

## 2014-05-03 MED ORDER — BUPIVACAINE HCL (PF) 0.5 % IJ SOLN
10.0000 mL | Freq: Once | INTRAMUSCULAR | Status: AC
  Administered 2014-05-03: 10 mL

## 2014-05-03 NOTE — ED Provider Notes (Signed)
CSN: [id number]     Arrival date & time 05/02/14  2246 History   First MD Initiated Contact with Patient 05/02/14 2336     No chief complaint on file.    (Consider location/radiation/quality/duration/timing/severity/associated sxs/prior Treatment) Patient is a 53 y.o. male presenting with hand pain. The history is provided by the patient. No language interpreter was used.  Hand Pain This is a new problem. The current episode started in the past 7 days. Pertinent negatives include no chills, fever or numbness. Associated symptoms comments: Pain in the distal left ring finger that involves redness and swelling to the pad of the finger. NO fever..    Past Medical History  Diagnosis Date  . Hypertension   . Bipolar disorder    History reviewed. No pertinent past surgical history. No family history on file. History  Substance Use Topics  . Smoking status: Never Smoker   . Smokeless tobacco: Not on file  . Alcohol Use: No    Review of Systems  Constitutional: Negative for fever and chills.  Musculoskeletal:       Left 4th finger pain.  Skin: Positive for color change.  Neurological: Negative.  Negative for numbness.      Allergies  Review of patient's allergies indicates no known allergies.  Home Medications   Prior to Admission medications   Medication Sig Start Date End Date Taking? Authorizing Provider  ARIPiprazole (ABILIFY) 15 MG tablet Take 1 tablet (15 mg total) by mouth 2 (two) times daily after a meal. 03/08/14   Beau FannyJohn C Withrow, FNP  gabapentin (NEURONTIN) 400 MG capsule Take 1 capsule (400 mg total) by mouth 2 (two) times daily. 03/08/14   Beau FannyJohn C Withrow, FNP  hydrochlorothiazide (HYDRODIURIL) 25 MG tablet Take 1 tablet (25 mg total) by mouth daily. 03/08/14   Beau FannyJohn C Withrow, FNP  lisinopril (PRINIVIL,ZESTRIL) 10 MG tablet Take 1 tablet (10 mg total) by mouth daily. 03/08/14   Beau FannyJohn C Withrow, FNP  lithium carbonate 300 MG capsule Take 1 capsule (300 mg total) by mouth  every morning. 03/08/14   Beau FannyJohn C Withrow, FNP  lithium carbonate 600 MG capsule Take 1 capsule (600 mg total) by mouth every evening. 03/08/14   Beau FannyJohn C Withrow, FNP  zolpidem (AMBIEN) 5 MG tablet Take 1 tablet (5 mg total) by mouth at bedtime. 03/08/14   Everardo AllJohn C Withrow, FNP   BP 167/90  Pulse 56  Temp(Src) 98.9 F (37.2 C) (Oral)  Resp 16  Ht 5\' 10"  (1.778 m)  Wt 161 lb 3.2 oz (73.12 kg)  BMI 23.13 kg/m2  SpO2 100% Physical Exam  Constitutional: He appears well-developed and well-nourished. No distress.  Musculoskeletal:  Left 4th finger swollen distally with associated redness and tenderness. Fluctuant nail base without drainage at cuticle. Redness extends to dorsal middle phalanx.  Skin: Skin is warm and dry. There is erythema.  Psychiatric: He has a normal mood and affect.    ED Course  INCISION AND DRAINAGE Date/Time: 05/03/2014 1:27 AM Performed by: Elpidio AnisUPSTILL, Julian Medina A Authorized by: Elpidio AnisUPSTILL, Sharaine Delange A Consent: Verbal consent obtained. Consent given by: patient Patient understanding: patient states understanding of the procedure being performed Patient identity confirmed: verbally with patient Time out: Immediately prior to procedure a "time out" was called to verify the correct patient, procedure, equipment, support staff and site/side marked as required. Type: abscess Body area: upper extremity Location details: left ring finger Anesthesia: digital block Local anesthetic: bupivacaine 0.5% without epinephrine Anesthetic total: 4 ml Patient sedated: no Scalpel  size: 11 Needle gauge: 22 Incision type: single straight Complexity: complex Drainage: purulent Drainage amount: moderate Packing material: 1/4 in iodoform gauze Patient tolerance: Patient tolerated the procedure well with no immediate complications. Comments: #11 blade used to incise nail cuticle with large amount of drained pus. Single linear incision to lateral distal finger with extensive blunt dissection of pad  of finger with packing placed.    (including critical care time) Labs Review Labs Reviewed - No data to display  Imaging Review Dg Finger Ring Left  05/03/2014   CLINICAL DATA:  Infected distal and middle phalanx for 3 days. No known trauma.  EXAM: LEFT RING FINGER 2+V  COMPARISON:  None.  FINDINGS: There is apparent mild diffuse soft tissue swelling primarily involving the distal phalanx of the third digit. This finding is without associated subcutaneous emphysema or radiopaque foreign body. No discrete area of osteolysis to suggest osteomyelitis. No fracture dislocation. Joint spaces are preserved.  IMPRESSION: Nonspecific mild diffuse soft tissue swelling about the distal phalanx of the third digit without associated radiopaque foreign body or radiographic evidence of osteomyelitis.   Electronically Signed   By: Simonne ComeJohn  Watts M.D.   On: 05/03/2014 00:06     EKG Interpretation None      MDM   Final diagnoses:  None    1. Felon  I&D as per note. Patient placed on Doxycycline, pain management provided. Refer to hand Ortho Melvyn Novas([facility]mann for recheck in 2 days.    Arnoldo HookerShari A Omaria Plunk, PA-C 05/03/14 0133  Arnoldo HookerShari A Lancelot Alyea, PA-C 05/03/14 0139

## 2014-05-03 NOTE — ED Notes (Signed)
Keflex 500mg  PO BID x 5 days, #10, [facility] [address], [place], per Dr. Krista BlueA. Oni

## 2014-05-03 NOTE — ED Provider Notes (Signed)
Patient presents for infection of his finger.  Appears to be a paronychia and a felon.  I&D performed and patient should be DC on doxy and keflex.  I called floor coordinator to call in Rx for keflex as I can not see him documented one in his chart.  Medical screening examination/treatment/procedure(s) were conducted as a shared visit with non-physician practitioner(s) and myself.  I personally evaluated the patient during the encounter.   EKG Interpretation None         Tomasita CrumbleAdeleke Nyonna Hargrove, MD 05/03/14 [id number]

## 2014-05-03 NOTE — ED Notes (Signed)
Se the pas notes she saw beofre myself.  The pt just returned from xray

## 2014-05-03 NOTE — Discharge Instructions (Signed)

## 2014-07-26 DIAGNOSIS — F209 Schizophrenia, unspecified: Secondary | ICD-10-CM | POA: Diagnosis not present

## 2014-08-02 DIAGNOSIS — F209 Schizophrenia, unspecified: Secondary | ICD-10-CM | POA: Diagnosis not present

## 2014-08-15 DIAGNOSIS — F209 Schizophrenia, unspecified: Secondary | ICD-10-CM | POA: Diagnosis not present

## 2014-08-29 DIAGNOSIS — F209 Schizophrenia, unspecified: Secondary | ICD-10-CM | POA: Diagnosis not present

## 2014-09-20 DIAGNOSIS — F209 Schizophrenia, unspecified: Secondary | ICD-10-CM | POA: Diagnosis not present

## 2014-10-10 DIAGNOSIS — F209 Schizophrenia, unspecified: Secondary | ICD-10-CM | POA: Diagnosis not present

## 2014-11-08 DIAGNOSIS — F209 Schizophrenia, unspecified: Secondary | ICD-10-CM | POA: Diagnosis not present

## 2014-11-21 DIAGNOSIS — F209 Schizophrenia, unspecified: Secondary | ICD-10-CM | POA: Diagnosis not present

## 2014-12-12 DIAGNOSIS — F209 Schizophrenia, unspecified: Secondary | ICD-10-CM | POA: Diagnosis not present

## 2015-01-02 DIAGNOSIS — F209 Schizophrenia, unspecified: Secondary | ICD-10-CM | POA: Diagnosis not present

## 2015-01-30 DIAGNOSIS — F209 Schizophrenia, unspecified: Secondary | ICD-10-CM | POA: Diagnosis not present

## 2015-03-20 DIAGNOSIS — F209 Schizophrenia, unspecified: Secondary | ICD-10-CM | POA: Diagnosis not present

## 2015-04-03 DIAGNOSIS — F209 Schizophrenia, unspecified: Secondary | ICD-10-CM | POA: Diagnosis not present

## 2015-04-17 DIAGNOSIS — F209 Schizophrenia, unspecified: Secondary | ICD-10-CM | POA: Diagnosis not present

## 2015-05-15 DIAGNOSIS — F209 Schizophrenia, unspecified: Secondary | ICD-10-CM | POA: Diagnosis not present

## 2015-06-05 DIAGNOSIS — F209 Schizophrenia, unspecified: Secondary | ICD-10-CM | POA: Diagnosis not present

## 2015-06-19 DIAGNOSIS — F209 Schizophrenia, unspecified: Secondary | ICD-10-CM | POA: Diagnosis not present

## 2015-07-17 DIAGNOSIS — F209 Schizophrenia, unspecified: Secondary | ICD-10-CM | POA: Diagnosis not present

## 2015-07-31 DIAGNOSIS — F209 Schizophrenia, unspecified: Secondary | ICD-10-CM | POA: Diagnosis not present

## 2015-09-24 DIAGNOSIS — F209 Schizophrenia, unspecified: Secondary | ICD-10-CM | POA: Diagnosis not present

## 2015-10-04 ENCOUNTER — Ambulatory Visit (INDEPENDENT_AMBULATORY_CARE_PROVIDER_SITE_OTHER): Payer: Worker's Compensation | Admitting: Family Medicine

## 2015-10-04 ENCOUNTER — Ambulatory Visit: Payer: Worker's Compensation

## 2015-10-04 VITALS — BP 128/88 | HR 86 | Temp 97.7°F | Resp 16 | Ht 70.0 in | Wt 177.0 lb

## 2015-10-04 DIAGNOSIS — S86912A Strain of unspecified muscle(s) and tendon(s) at lower leg level, left leg, initial encounter: Secondary | ICD-10-CM | POA: Diagnosis not present

## 2015-10-04 DIAGNOSIS — M25562 Pain in left knee: Secondary | ICD-10-CM

## 2015-10-04 MED ORDER — MELOXICAM 7.5 MG PO TABS: 7.5000 mg | ORAL_TABLET | Freq: Every day | ORAL | Status: DC

## 2015-10-04 NOTE — Progress Notes (Signed)
Subjective:  By signing my name below, I, Stann Oresung-Kai Tsai, attest that this documentation has been prepared under the direction and in the presence of Meredith StaggersJeffrey Juanya Villavicencio, MD. Electronically Signed: Stann Oresung-Kai Tsai, Scribe. 10/04/2015 , 12:51 PM .  Patient was seen in Room 1 .   Patient ID: Jerry Carter, male    DOB: 07-Oct-1960, 55 y.o.   MRN: [id number] Chief Complaint  Patient presents with  . Leg Injury    left, x 11 days    HPI Jerry Carter is a 55 y.o. male Pt is here due to an injury that occurred at work, date of injury approx 09/23/15. He states that he was taking out the trash and was rolling the trash bin out. He was pushing really hard and walking fast. He describes jamming his leg down, causing pain in his left knee (described area at mid upper tibia up to his upper left knee). He denies falling afterwards. He's still able to ambulate but with pain. He denies taking any medication or applying ice to the area to alleviate the pain. He reports getting worse and feeling more sore. He denies any swelling. He denies knee giving way or locking up.   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   No Known Allergies Prior to Admission medications   Medication Sig Start Date End Date Taking? Authorizing Provider  fluPHENAZine decanoate (PROLIXIN) 25 MG/ML injection Inject into the muscle every 14 (fourteen) days.   Yes Historical Provider, MD   Review of Systems  Constitutional: Negative for fever and fatigue.  Gastrointestinal: Negative for nausea, vomiting and diarrhea.  Musculoskeletal: Positive for myalgias, arthralgias and gait problem. Negative for back pain, joint swelling, neck pain and neck stiffness.  Skin: Negative for rash and wound.  Neurological: Negative for weakness and numbness.      Objective:   Physical Exam  Constitutional: He is oriented to person, place, and time. He appears well-developed and well-nourished. No distress.  HENT:  Head: Normocephalic and  atraumatic.  Eyes: EOM are normal. Pupils are equal, round, and reactive to light.  Neck: Neck supple.  Cardiovascular: Normal rate.   Pulmonary/Chest: Effort normal. No respiratory distress.  Musculoskeletal: Normal range of motion.  Left knee exam: trace effusion in left knee, skin intact, no erythema no warmth, full flexion full extension, patellar non tender, no bony tenderness, anterior tibia non tender, negative varus, negative valgus, some crepitous, negative mcmurray, negative lachman, NVI distally  Neurological: He is alert and oriented to person, place, and time.  Skin: Skin is warm and dry.  Psychiatric: He has a normal mood and affect. His behavior is normal.  Nursing note and vitals reviewed.   Filed Vitals:   10/04/15 1145  BP: 128/88  Pulse: 86  Temp: 97.7 F (36.5 C)  TempSrc: Oral  Resp: 16  Height: 5\' 10"  (1.778 m)  Weight: 177 lb (80.287 kg)  SpO2: 99%   Dg Knee Complete 4 Views Left  10/04/2015  CLINICAL DATA:  Left anterior knee pain EXAM: LEFT KNEE - COMPLETE 4+ VIEW COMPARISON:  None. FINDINGS: No acute fracture. No dislocation.  Unremarkable soft tissues. IMPRESSION: No acute bony pathology. Electronically Signed   By: Jolaine ClickArthur  Hoss M.D.   On: 10/04/2015 13:27      Assessment & Plan:   Jerry Carter is a 55 y.o. male Left anterior knee pain - Plan: DG Knee Complete 4 Views Left, meloxicam (MOBIC) 7.5 MG tablet, Apply knee sleeve  Strain of knee and leg, left,  initial encounter - Plan: meloxicam (MOBIC) 7.5 MG tablet, Apply knee sleeve  Injury at work 11 days ago, possible strain or twisting type injury, minimal effusion, no bony findings on x-ray, unable to reproduce pain with palpation. Possible strain of collaterals versus patellofemoral pain versus flare of degenerative disease, but no significant degenerative joint disease seen on x-ray.  -Trial of his knee brace, meloxicam 7.5 mg daily, temporary restrictions, and recheck in 6 days.  -Restrictions  given on paperwork for work. rtc if worse sooner.    Meds ordered this encounter  Medications  . meloxicam (MOBIC) 7.5 MG tablet    Sig: Take 1 tablet (7.5 mg total) by mouth daily.    Dispense:  30 tablet    Refill:  0   Patient Instructions       IF you received an x-ray today, you will receive an invoice from [facility]. Please contact [facility] at [phone] with questions or concerns regarding your invoice.   IF you received labwork today, you will receive an invoice from [employer] [facility]. Please contact [facility] at [phone] with questions or concerns regarding your invoice.   Our billing staff will not be able to assist you with questions regarding bills from these companies.  You will be contacted with the lab results as soon as they are available. The fastest way to get your results is to activate your My Chart account. Instructions are located on the last page of this paperwork. If you have not heard from [place] regarding the results in 2 weeks, please contact this office.    You can take the meloxicam 1 pill once per day for pain and inflammation, use the knee brace as needed at work, avoid direct pressure to the front of your knee and avoid repetitive bending or twisting ,and prolonged walking. See paperwork provider for your employer. Follow-up with me in 6 days, sooner if worse.    I personally performed the services described in this documentation, which was scribed in my presence. The recorded information has been reviewed and considered, and addended by me as needed.

## 2015-10-04 NOTE — Patient Instructions (Addendum)
     IF you received an x-ray today, you will receive an invoice from [facility]. Please contact [facility] at [phone] with questions or concerns regarding your invoice.   IF you received labwork today, you will receive an invoice from [facility]. Please contact [facility] at [phone] with questions or concerns regarding your invoice.   Our billing staff will not be able to assist you with questions regarding bills from these companies.  You will be contacted with the lab results as soon as they are available. The fastest way to get your results is to activate your My Chart account. Instructions are located on the last page of this paperwork. If you have not heard from us regarding the results in 2 weeks, please contact this office.    You can take the meloxicam 1 pill once per day for pain and inflammation, use the knee brace as needed at work, avoid direct pressure to the front of your knee and avoid repetitive bending or twisting ,and prolonged walking. See paperwork provider for your employer. Follow-up with me in 6 days, sooner if worse.

## 2015-10-09 DIAGNOSIS — F209 Schizophrenia, unspecified: Secondary | ICD-10-CM | POA: Diagnosis not present

## 2015-10-10 ENCOUNTER — Ambulatory Visit (INDEPENDENT_AMBULATORY_CARE_PROVIDER_SITE_OTHER): Payer: Worker's Compensation | Admitting: Family Medicine

## 2015-10-10 VITALS — BP 138/98 | HR 68 | Temp 97.8°F | Resp 16 | Ht 70.0 in | Wt 176.0 lb

## 2015-10-10 DIAGNOSIS — M25562 Pain in left knee: Secondary | ICD-10-CM | POA: Diagnosis not present

## 2015-10-10 DIAGNOSIS — S86912D Strain of unspecified muscle(s) and tendon(s) at lower leg level, left leg, subsequent encounter: Secondary | ICD-10-CM | POA: Diagnosis not present

## 2015-10-10 NOTE — Patient Instructions (Addendum)
     IF you received an x-ray today, you will receive an invoice from [facility]. Please contact [facility] at [phone] with questions or concerns regarding your invoice.   IF you received labwork today, you will receive an invoice from [facility]. Please contact [facility] at [phone] with questions or concerns regarding your invoice.   Our billing staff will not be able to assist you with questions regarding bills from these companies.  You will be contacted with the lab results as soon as they are available. The fastest way to get your results is to activate your My Chart account. Instructions are located on the last page of this paperwork. If you have not heard from us regarding the results in 2 weeks, please contact this office.     As your knee is improving, you do not need to take the anti-inflammatory pain medicine unless needed. Full duty at work for now, but can use the knee brace if needed. Recheck with me in 2 weeks unless any worsening of your symptoms before then. Return sooner if worse.

## 2015-10-10 NOTE — Progress Notes (Signed)
Subjective:  By signing my name below, I, Stann Oresung-Kai Tsai, attest that this documentation has been prepared under the direction and in the presence of Meredith StaggersJeffrey Janet Humphreys, MD. Electronically Signed: Stann Oresung-Kai Tsai, Scribe. 10/10/2015 , 2:09 PM .  Patient was seen in Room 13 .   Patient ID: Jerry Carter, male    DOB: [birth year], 55 y.o.   MRN: [id number] Chief Complaint  Patient presents with  . Follow-up    left leg  . Flu Vaccine   HPI Jerry Carter is a 55 y.o. male Here for left leg injury that occurred while at work. Date of injury approx 09/23/15. He was seen 6 days ago by me for left anterior knee pain without apparent localized tenderness but has minimal effusion and left leg strain. Xray was done at that visit: no bony tenderness, no acute fracture, no dislocation and unremarkable soft tissues. He was put on restrictions for work. Placed in knee brace, meloxicam 7.5mg  qd.   Pt states that he's doing much better, and has returned to about 92%. He has some irritation without any giving way or locking up. He wore the knee brace for about 1-2 days; hasn't used for 3-4 days. He's able to walk up and down stairs. He's been taking the meloxicam every now and then, only when needed.   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   He also wants a flu vaccine but plans on checking with his insurance as this is not related with his workers comp injury.   No Known Allergies Prior to Admission medications   Medication Sig Start Date End Date Taking? Authorizing Provider  fluPHENAZine decanoate (PROLIXIN) 25 MG/ML injection Inject into the muscle every 14 (fourteen) days.    Historical Provider, MD  meloxicam (MOBIC) 7.5 MG tablet Take 1 tablet (7.5 mg total) by mouth daily. 10/04/15   Shade FloodJeffrey R Daaiyah Baumert, MD    Review of Systems  Constitutional: Negative for fever, chills and fatigue.  Musculoskeletal: Positive for myalgias and arthralgias. Negative for back pain, joint swelling, gait problem,  neck pain and neck stiffness.  Skin: Negative for rash and wound.  Neurological: Negative for dizziness, weakness and numbness.       Objective:   Physical Exam  Constitutional: He is oriented to person, place, and time. He appears well-developed and well-nourished. No distress.  HENT:  Head: Normocephalic and atraumatic.  Eyes: EOM are normal. Pupils are equal, round, and reactive to light.  Neck: Neck supple.  Cardiovascular: Normal rate.   Pulmonary/Chest: Effort normal. No respiratory distress.  Musculoskeletal: Normal range of motion.  Full rom left knee, negative varus, negative valgus without pain, negative mcmurray's, negative lachman, trace effusion, skin intact, no warmth or redness  Neurological: He is alert and oriented to person, place, and time.  Skin: Skin is warm and dry.  Psychiatric: He has a normal mood and affect. His behavior is normal.  Nursing note and vitals reviewed.   Filed Vitals:   10/10/15 1141 10/10/15 1350  BP: 132/96 138/98  Pulse: 68   Temp: 97.8 F (36.6 C)   Resp: 16   Height: 5\' 10"  (1.778 m)   Weight: 176 lb (79.833 kg)   SpO2: 98%       Assessment & Plan:   Jerry Carter is a 55 y.o. male Left knee pain  Strain of left knee, subsequent encounter Knee pain, strain due to injury at work as above. Improvement in past few days. Reassuring exam, trace effusion, but no  instability, no focal tenderness. Reassuring initial x-ray  - Return to trial of full duty, but can use knee brace as needed if he does not have any pain  -can stop meloxicam, or take if needed.  - Recheck 2 weeks, sooner if any worsening of symptoms. See paperwork for employer.   No orders of the defined types were placed in this encounter.   Patient Instructions       IF you received an x-ray today, you will receive an invoice from [facility]. Please contact [facility] at [phone] with questions or concerns regarding your invoice.   IF you  received labwork today, you will receive an invoice from [employer]. Please contact [facility] at [phone] with questions or concerns regarding your invoice.   Our billing staff will not be able to assist you with questions regarding bills from these companies.  You will be contacted with the lab results as soon as they are available. The fastest way to get your results is to activate your My Chart account. Instructions are located on the last page of this paperwork. If you have not heard from [place] regarding the results in 2 weeks, please contact this office.     As your knee is improving, you do not need to take the anti-inflammatory pain medicine unless needed. Full duty at work for now, but can use the knee brace if needed. Recheck with me in 2 weeks unless any worsening of your symptoms before then. Return sooner if worse.    I personally performed the services described in this documentation, which was scribed in my presence. The recorded information has been reviewed and considered, and addended by me as needed.

## 2015-10-24 ENCOUNTER — Ambulatory Visit (INDEPENDENT_AMBULATORY_CARE_PROVIDER_SITE_OTHER): Payer: Worker's Compensation | Admitting: Family Medicine

## 2015-10-24 VITALS — BP 130/82 | HR 84 | Temp 98.3°F | Resp 16 | Ht 70.0 in | Wt 182.0 lb

## 2015-10-24 DIAGNOSIS — F209 Schizophrenia, unspecified: Secondary | ICD-10-CM | POA: Diagnosis not present

## 2015-10-24 DIAGNOSIS — M25562 Pain in left knee: Secondary | ICD-10-CM | POA: Diagnosis not present

## 2015-10-24 NOTE — Progress Notes (Signed)
Subjective:  By signing my name below, I, Stann Ore, attest that this documentation has been prepared under the direction and in the presence of Meredith Staggers, MD. Electronically Signed: Stann Ore, Scribe. 10/24/2015 , 5:14 PM .  Patient was seen in Room 4 .   Patient ID: Jerry Carter, male    DOB: 08-30-1960, 55 y.o.   MRN: [id number] Chief Complaint  Patient presents with  . Follow-up  . Knee Pain    states his knee is doing better   HPI Jerry Carter is a 55 y.o. male Here for follow up knee pain. Initially seen 10/04/15 due to knee pain that occurred at work approximately 09/23/15. Anterior knee pain without apparent fracture on xray or acute bony pathology. He was seen in follow up. He was last seen 10/10/15, doing much better about 90% improved. Trial of full duty, use of knee brace if needed; advised to stop meloxicam unless needed.   Patient states he has an occasional twinge when he's walking. He informs wearing the knee brace when he's at work on full duty. He's stopped taking the meloxicam recently; since last visit, he's taken it about once or twice. He denies weakness, giving way, or locking of his left knee.   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   No Known Allergies Prior to Admission medications   Medication Sig Start Date End Date Taking? Authorizing Provider  fluPHENAZine decanoate (PROLIXIN) 25 MG/ML injection Inject into the muscle every 14 (fourteen) days.   Yes Historical Provider, MD    Review of Systems  Constitutional: Negative for fever, chills and fatigue.  Gastrointestinal: Negative for nausea and vomiting.  Musculoskeletal: Positive for myalgias and arthralgias. Negative for back pain, joint swelling and gait problem.  Skin: Negative for rash and wound.  Neurological: Negative for weakness and numbness.       Objective:   Physical Exam  Constitutional: He is oriented to person, place, and time. He appears well-developed and  well-nourished. No distress.  HENT:  Head: Normocephalic and atraumatic.  Eyes: EOM are normal. Pupils are equal, round, and reactive to light.  Neck: Neck supple.  Cardiovascular: Normal rate.   Pulmonary/Chest: Effort normal. No respiratory distress.  Musculoskeletal: Normal range of motion.  Trace effusion in left knee, full rom, no extension lag, no pain with patellar compression, patellar tendon is not tender, negative mcmurray's, some crepitous, negative lachman  Neurological: He is alert and oriented to person, place, and time.  Skin: Skin is warm and dry.  Psychiatric: He has a normal mood and affect. His behavior is normal.  Nursing note and vitals reviewed.   Filed Vitals:   10/24/15 1632  BP: 130/82  Pulse: 84  Temp: 98.3 F (36.8 C)  TempSrc: Oral  Resp: 16  Height: 5\' 10"  (1.778 m)  Weight: 182 lb (82.555 kg)  SpO2: 98%      Assessment & Plan:   Jerry Carter is a 55 y.o. male Left knee pain Anterior left knee pain, differential of possible patellofemoral syndrome versus patellar tendinitis, but does have trace effusion on exam. No locking or giving way, occasional popping sensation or twinge as he describes it. Full range of motion on exam, full strength, negative McMurray's. Overall reassuring exam except for the trace effusion. Differential also could include a small meniscus tear.   -as he is not having any mechanical symptoms and overall much improved, will continue symptomatic care, brace if needed, and general knee exercises were given for  strengthening. Mobic if needed, and follow-up in 2 weeks for recheck. If at that time he still having some discomfort, consider orthopedic evaluation. Note for work given  No orders of the defined types were placed in this encounter.   Patient Instructions       IF you received an x-ray today, you will receive an invoice from [facility]. Please contact [facility] at [phone] with questions or  concerns regarding your invoice.   IF you received labwork today, you will receive an invoice from [employer]. Please contact [facility] at [phone] with questions or concerns regarding your invoice.   Our billing staff will not be able to assist you with questions regarding bills from these companies.  You will be contacted with the lab results as soon as they are available. The fastest way to get your results is to activate your My Chart account. Instructions are located on the last page of this paperwork. If you have not heard from [place] regarding the results in 2 weeks, please contact this office.     With the occasional twinge or popping that you feel, there may be some irritation underneath the kneecap or in the tendon below the kneecap.   You can try taking meloxicam up to once per day to see if this helps. If it is not sore and not popping, you do not need to take the meloxicam. As long as they are not causing pain, you can start doing some exercises I have listed below. Pick 2 or 3 exercises each day and 8-10 repetitions of each. Use a knee brace while at work as needed, and recheck in the next 2 weeks. Sooner if any worsening of symptoms including any locking or giving way. If not much improved at next visit, we may have you see an orthopedist.  Knee Pain Knee pain is a very common symptom and can have many causes. Knee pain often goes away when you follow your health care provider's instructions for relieving pain and discomfort at home. However, knee pain can develop into a condition that needs treatment. Some conditions may include:  Arthritis caused by wear and tear (osteoarthritis).  Arthritis caused by swelling and irritation (rheumatoid arthritis or gout).  A cyst or growth in your knee.  An infection in your knee joint.  An injury that will not heal.  Damage, swelling, or irritation of the tissues that support your knee (torn ligaments or  tendinitis). If your knee pain continues, additional tests may be ordered to diagnose your condition. Tests may include X-rays or other imaging studies of your knee. You may also need to have fluid removed from your knee. Treatment for ongoing knee pain depends on the cause, but treatment may include:  Medicines to relieve pain or swelling.  Steroid injections in your knee.  Physical therapy.  Surgery. HOME CARE INSTRUCTIONS  Take medicines only as directed by your health care provider.  Rest your knee and keep it raised (elevated) while you are resting.  Do not do things that cause or worsen pain.  Avoid high-impact activities or exercises, such as running, jumping rope, or doing jumping jacks.  Apply ice to the knee area:  Put ice in a plastic bag.  Place a towel between your skin and the bag.  Leave the ice on for 20 minutes, 2-3 times a day.  Ask your health care provider if you should wear an elastic knee support.  Keep a pillow under your knee when you  sleep.  Lose weight if you are overweight. Extra weight can put pressure on your knee.  Do not use any tobacco products, including cigarettes, chewing tobacco, or electronic cigarettes. If you need help quitting, ask your health care provider. Smoking may slow the healing of any bone and joint problems that you may have. SEEK MEDICAL CARE IF:  Your knee pain continues, changes, or gets worse.  You have a fever along with knee pain.  Your knee buckles or locks up.  Your knee becomes more swollen. SEEK IMMEDIATE MEDICAL CARE IF:   Your knee joint feels hot to the touch.  You have chest pain or trouble breathing.   This information is not intended to replace advice given to you by your health care provider. Make sure you discuss any questions you have with your health care provider.   Document Released: 05/03/2007 Document Revised: 07/27/2014 Document Reviewed: 02/19/2014 Elsevier Interactive Patient Education  2016 Elsevier Inc. Generic Knee Exercises EXERCISES RANGE OF MOTION (ROM) AND STRETCHING EXERCISES These exercises may help you when beginning to rehabilitate your injury. Your symptoms may resolve with or without further involvement from your physician, physical therapist, or athletic trainer. While completing these exercises, remember:   Restoring tissue flexibility helps normal motion to return to the joints. This allows healthier, less painful movement and activity.  An effective stretch should be held for at least 30 seconds.  A stretch should never be painful. You should only feel a gentle lengthening or release in the stretched tissue. STRETCH - Knee Extension, Prone  Lie on your stomach on a firm surface, such as a bed or countertop. Place your right / left knee and leg just beyond the edge of the surface. You may wish to place a towel under the far end of your right / left thigh for comfort.  Relax your leg muscles and allow gravity to straighten your knee. Your clinician may advise you to add an ankle weight if more resistance is helpful for you.  You should feel a stretch in the back of your right / left knee. Hold this position for __________ seconds. Repeat __________ times. Complete this stretch __________ times per day. * Your physician, physical therapist, or athletic trainer may ask you to add ankle weight to enhance your stretch.  RANGE OF MOTION - Knee Flexion, Active  Lie on your back with both knees straight. (If this causes back discomfort, bend your opposite knee, placing your foot flat on the floor.)  Slowly slide your heel back toward your buttocks until you feel a gentle stretch in the front of your knee or thigh.  Hold for __________ seconds. Slowly slide your heel back to the starting position. Repeat __________ times. Complete this exercise __________ times per day.  STRETCH - Quadriceps, Prone   Lie on your stomach on a firm surface, such as a bed or  padded floor.  Bend your right / left knee and grasp your ankle. If you are unable to reach your ankle or pant leg, use a belt around your foot to lengthen your reach.  Gently pull your heel toward your buttocks. Your knee should not slide out to the side. You should feel a stretch in the front of your thigh and/or knee.  Hold this position for __________ seconds. Repeat __________ times. Complete this stretch __________ times per day.  STRETCH - Hamstrings, Supine   Lie on your back. Loop a belt or towel over the ball of your right / left foot.  Straighten your right / left knee and slowly pull on the belt to raise your leg. Do not allow the right / left knee to bend. Keep your opposite leg flat on the floor.  Raise the leg until you feel a gentle stretch behind your right / left knee or thigh. Hold this position for __________ seconds. Repeat __________ times. Complete this stretch __________ times per day.  STRENGTHENING EXERCISES These exercises may help you when beginning to rehabilitate your injury. They may resolve your symptoms with or without further involvement from your physician, physical therapist, or athletic trainer. While completing these exercises, remember:   Muscles can gain both the endurance and the strength needed for everyday activities through controlled exercises.  Complete these exercises as instructed by your physician, physical therapist, or athletic trainer. Progress the resistance and repetitions only as guided.  You may experience muscle soreness or fatigue, but the pain or discomfort you are trying to eliminate should never worsen during these exercises. If this pain does worsen, stop and make certain you are following the directions exactly. If the pain is still present after adjustments, discontinue the exercise until you can discuss the trouble with your clinician. STRENGTH - Quadriceps, Isometrics  Lie on your back with your right / left leg extended and  your opposite knee bent.  Gradually tense the muscles in the front of your right / left thigh. You should see either your knee cap slide up toward your hip or increased dimpling just above the knee. This motion will push the back of the knee down toward the floor/mat/bed on which you are lying.  Hold the muscle as tight as you can without increasing your pain for __________ seconds.  Relax the muscles slowly and completely in between each repetition. Repeat __________ times. Complete this exercise __________ times per day.  STRENGTH - Quadriceps, Short Arcs   Lie on your back. Place a __________ inch towel roll under your knee so that the knee slightly bends.  Raise only your lower leg by tightening the muscles in the front of your thigh. Do not allow your thigh to rise.  Hold this position for __________ seconds. Repeat __________ times. Complete this exercise __________ times per day.  OPTIONAL ANKLE WEIGHTS: Begin with ____________________, but DO NOT exceed ____________________. Increase in 1 pound/0.5 kilogram increments.  STRENGTH - Quadriceps, Straight Leg Raises  Quality counts! Watch for signs that the quadriceps muscle is working to insure you are strengthening the correct muscles and not "cheating" by substituting with healthier muscles.  Lay on your back with your right / left leg extended and your opposite knee bent.  Tense the muscles in the front of your right / left thigh. You should see either your knee cap slide up or increased dimpling just above the knee. Your thigh may even quiver.  Tighten these muscles even more and raise your leg 4 to 6 inches off the floor. Hold for __________ seconds.  Keeping these muscles tense, lower your leg.  Relax the muscles slowly and completely in between each repetition. Repeat __________ times. Complete this exercise __________ times per day.  STRENGTH - Hamstring, Curls  Lay on your stomach with your legs extended. (If you lay on  a bed, your feet may hang over the edge.)  Tighten the muscles in the back of your thigh to bend your right / left knee up to 90 degrees. Keep your hips flat on the bed/floor.  Hold this position for __________ seconds.  Slowly  lower your leg back to the starting position. Repeat __________ times. Complete this exercise __________ times per day.  OPTIONAL ANKLE WEIGHTS: Begin with ____________________, but DO NOT exceed ____________________. Increase in 1 pound/0.5 kilogram increments.  STRENGTH - Quadriceps, Squats  Stand in a door frame so that your feet and knees are in line with the frame.  Use your hands for balance, not support, on the frame.  Slowly lower your weight, bending at the hips and knees. Keep your lower legs upright so that they are parallel with the door frame. Squat only within the range that does not increase your knee pain. Never let your hips drop below your knees.  Slowly return upright, pushing with your legs, not pulling with your hands. Repeat __________ times. Complete this exercise __________ times per day.  STRENGTH - Quadriceps, Wall Slides  Follow guidelines for form closely. Increased knee pain often results from poorly placed feet or knees.  Lean against a smooth wall or door and walk your feet out 18-24 inches. Place your feet hip-width apart.  Slowly slide down the wall or door until your knees bend __________ degrees.* Keep your knees over your heels, not your toes, and in line with your hips, not falling to either side.  Hold for __________ seconds. Stand up to rest for __________ seconds in between each repetition. Repeat __________ times. Complete this exercise __________ times per day. * Your physician, physical therapist, or athletic trainer will alter this angle based on your symptoms and progress.   This information is not intended to replace advice given to you by your health care provider. Make sure you discuss any questions you have with  your health care provider.   Document Released: 05/20/2005 Document Revised: 07/27/2014 Document Reviewed: 10/18/2008 Elsevier Interactive Patient Education [employer]2016 Elsevier Inc.     I personally performed the services described in this documentation, which was scribed in my presence. The recorded information has been reviewed and considered, and addended by me as needed.

## 2015-10-24 NOTE — Patient Instructions (Addendum)
IF you received an x-ray today, you will receive an invoice from [facility]. Please contact [facility] at [phone] with questions or concerns regarding your invoice.   IF you received labwork today, you will receive an invoice from [employer]. Please contact [facility] at [phone] with questions or concerns regarding your invoice.   Our billing staff will not be able to assist you with questions regarding bills from these companies.  You will be contacted with the lab results as soon as they are available. The fastest way to get your results is to activate your My Chart account. Instructions are located on the last page of this paperwork. If you have not heard from [place] regarding the results in 2 weeks, please contact this office.     With the occasional twinge or popping that you feel, there may be some irritation underneath the kneecap or in the tendon below the kneecap.   You can try taking meloxicam up to once per day to see if this helps. If it is not sore and not popping, you do not need to take the meloxicam. As long as they are not causing pain, you can start doing some exercises I have listed below. Pick 2 or 3 exercises each day and 8-10 repetitions of each. Use a knee brace while at work as needed, and recheck in the next 2 weeks. Sooner if any worsening of symptoms including any locking or giving way. If not much improved at next visit, we may have you see an orthopedist.  Knee Pain Knee pain is a very common symptom and can have many causes. Knee pain often goes away when you follow your health care provider's instructions for relieving pain and discomfort at home. However, knee pain can develop into a condition that needs treatment. Some conditions may include:  Arthritis caused by wear and tear (osteoarthritis).  Arthritis caused by swelling and irritation (rheumatoid arthritis or gout).  A cyst or growth in your knee.  An  infection in your knee joint.  An injury that will not heal.  Damage, swelling, or irritation of the tissues that support your knee (torn ligaments or tendinitis). If your knee pain continues, additional tests may be ordered to diagnose your condition. Tests may include X-rays or other imaging studies of your knee. You may also need to have fluid removed from your knee. Treatment for ongoing knee pain depends on the cause, but treatment may include:  Medicines to relieve pain or swelling.  Steroid injections in your knee.  Physical therapy.  Surgery. HOME CARE INSTRUCTIONS  Take medicines only as directed by your health care provider.  Rest your knee and keep it raised (elevated) while you are resting.  Do not do things that cause or worsen pain.  Avoid high-impact activities or exercises, such as running, jumping rope, or doing jumping jacks.  Apply ice to the knee area:  Put ice in a plastic bag.  Place a towel between your skin and the bag.  Leave the ice on for 20 minutes, 2-3 times a day.  Ask your health care provider if you should wear an elastic knee support.  Keep a pillow under your knee when you sleep.  Lose weight if you are overweight. Extra weight can put pressure on your knee.  Do not use any tobacco products, including cigarettes, chewing tobacco, or electronic cigarettes. If you need help quitting, ask your health care provider. Smoking may slow the healing of any  bone and joint problems that you may have. SEEK MEDICAL CARE IF:  Your knee pain continues, changes, or gets worse.  You have a fever along with knee pain.  Your knee buckles or locks up.  Your knee becomes more swollen. SEEK IMMEDIATE MEDICAL CARE IF:   Your knee joint feels hot to the touch.  You have chest pain or trouble breathing.   This information is not intended to replace advice given to you by your health care provider. Make sure you discuss any questions you have with your  health care provider.   Document Released: 05/03/2007 Document Revised: 07/27/2014 Document Reviewed: 02/19/2014 Elsevier Interactive Patient Education 2016 Elsevier Inc. Generic Knee Exercises EXERCISES RANGE OF MOTION (ROM) AND STRETCHING EXERCISES These exercises may help you when beginning to rehabilitate your injury. Your symptoms may resolve with or without further involvement from your physician, physical therapist, or athletic trainer. While completing these exercises, remember:   Restoring tissue flexibility helps normal motion to return to the joints. This allows healthier, less painful movement and activity.  An effective stretch should be held for at least 30 seconds.  A stretch should never be painful. You should only feel a gentle lengthening or release in the stretched tissue. STRETCH - Knee Extension, Prone  Lie on your stomach on a firm surface, such as a bed or countertop. Place your right / left knee and leg just beyond the edge of the surface. You may wish to place a towel under the far end of your right / left thigh for comfort.  Relax your leg muscles and allow gravity to straighten your knee. Your clinician may advise you to add an ankle weight if more resistance is helpful for you.  You should feel a stretch in the back of your right / left knee. Hold this position for __________ seconds. Repeat __________ times. Complete this stretch __________ times per day. * Your physician, physical therapist, or athletic trainer may ask you to add ankle weight to enhance your stretch.  RANGE OF MOTION - Knee Flexion, Active  Lie on your back with both knees straight. (If this causes back discomfort, bend your opposite knee, placing your foot flat on the floor.)  Slowly slide your heel back toward your buttocks until you feel a gentle stretch in the front of your knee or thigh.  Hold for __________ seconds. Slowly slide your heel back to the starting position. Repeat  __________ times. Complete this exercise __________ times per day.  STRETCH - Quadriceps, Prone   Lie on your stomach on a firm surface, such as a bed or padded floor.  Bend your right / left knee and grasp your ankle. If you are unable to reach your ankle or pant leg, use a belt around your foot to lengthen your reach.  Gently pull your heel toward your buttocks. Your knee should not slide out to the side. You should feel a stretch in the front of your thigh and/or knee.  Hold this position for __________ seconds. Repeat __________ times. Complete this stretch __________ times per day.  STRETCH - Hamstrings, Supine   Lie on your back. Loop a belt or towel over the ball of your right / left foot.  Straighten your right / left knee and slowly pull on the belt to raise your leg. Do not allow the right / left knee to bend. Keep your opposite leg flat on the floor.  Raise the leg until you feel a gentle stretch behind your  right / left knee or thigh. Hold this position for __________ seconds. Repeat __________ times. Complete this stretch __________ times per day.  STRENGTHENING EXERCISES These exercises may help you when beginning to rehabilitate your injury. They may resolve your symptoms with or without further involvement from your physician, physical therapist, or athletic trainer. While completing these exercises, remember:   Muscles can gain both the endurance and the strength needed for everyday activities through controlled exercises.  Complete these exercises as instructed by your physician, physical therapist, or athletic trainer. Progress the resistance and repetitions only as guided.  You may experience muscle soreness or fatigue, but the pain or discomfort you are trying to eliminate should never worsen during these exercises. If this pain does worsen, stop and make certain you are following the directions exactly. If the pain is still present after adjustments, discontinue the  exercise until you can discuss the trouble with your clinician. STRENGTH - Quadriceps, Isometrics  Lie on your back with your right / left leg extended and your opposite knee bent.  Gradually tense the muscles in the front of your right / left thigh. You should see either your knee cap slide up toward your hip or increased dimpling just above the knee. This motion will push the back of the knee down toward the floor/mat/bed on which you are lying.  Hold the muscle as tight as you can without increasing your pain for __________ seconds.  Relax the muscles slowly and completely in between each repetition. Repeat __________ times. Complete this exercise __________ times per day.  STRENGTH - Quadriceps, Short Arcs   Lie on your back. Place a __________ inch towel roll under your knee so that the knee slightly bends.  Raise only your lower leg by tightening the muscles in the front of your thigh. Do not allow your thigh to rise.  Hold this position for __________ seconds. Repeat __________ times. Complete this exercise __________ times per day.  OPTIONAL ANKLE WEIGHTS: Begin with ____________________, but DO NOT exceed ____________________. Increase in 1 pound/0.5 kilogram increments.  STRENGTH - Quadriceps, Straight Leg Raises  Quality counts! Watch for signs that the quadriceps muscle is working to insure you are strengthening the correct muscles and not "cheating" by substituting with healthier muscles.  Lay on your back with your right / left leg extended and your opposite knee bent.  Tense the muscles in the front of your right / left thigh. You should see either your knee cap slide up or increased dimpling just above the knee. Your thigh may even quiver.  Tighten these muscles even more and raise your leg 4 to 6 inches off the floor. Hold for __________ seconds.  Keeping these muscles tense, lower your leg.  Relax the muscles slowly and completely in between each repetition. Repeat  __________ times. Complete this exercise __________ times per day.  STRENGTH - Hamstring, Curls  Lay on your stomach with your legs extended. (If you lay on a bed, your feet may hang over the edge.)  Tighten the muscles in the back of your thigh to bend your right / left knee up to 90 degrees. Keep your hips flat on the bed/floor.  Hold this position for __________ seconds.  Slowly lower your leg back to the starting position. Repeat __________ times. Complete this exercise __________ times per day.  OPTIONAL ANKLE WEIGHTS: Begin with ____________________, but DO NOT exceed ____________________. Increase in 1 pound/0.5 kilogram increments.  STRENGTH - Quadriceps, Squats  Stand in a door  frame so that your feet and knees are in line with the frame.  Use your hands for balance, not support, on the frame.  Slowly lower your weight, bending at the hips and knees. Keep your lower legs upright so that they are parallel with the door frame. Squat only within the range that does not increase your knee pain. Never let your hips drop below your knees.  Slowly return upright, pushing with your legs, not pulling with your hands. Repeat __________ times. Complete this exercise __________ times per day.  STRENGTH - Quadriceps, Wall Slides  Follow guidelines for form closely. Increased knee pain often results from poorly placed feet or knees.  Lean against a smooth wall or door and walk your feet out 18-24 inches. Place your feet hip-width apart.  Slowly slide down the wall or door until your knees bend __________ degrees.* Keep your knees over your heels, not your toes, and in line with your hips, not falling to either side.  Hold for __________ seconds. Stand up to rest for __________ seconds in between each repetition. Repeat __________ times. Complete this exercise __________ times per day. * Your physician, physical therapist, or athletic trainer will alter this angle based on your symptoms and  progress.   This information is not intended to replace advice given to you by your health care provider. Make sure you discuss any questions you have with your health care provider.   Document Released: 05/20/2005 Document Revised: 07/27/2014 Document Reviewed: 10/18/2008 Elsevier Interactive Patient Education [employer] Elsevier Inc.

## 2015-10-25 ENCOUNTER — Encounter (HOSPITAL_COMMUNITY): Payer: Self-pay | Admitting: *Deleted

## 2015-10-25 ENCOUNTER — Emergency Department (HOSPITAL_COMMUNITY)
Admission: EM | Admit: 2015-10-25 | Discharge: 2015-10-25 | Disposition: A | Discharge status: Home / Self Care | Payer: Medicare Other | Attending: Emergency Medicine | Admitting: Emergency Medicine

## 2015-10-25 DIAGNOSIS — Z8659 Personal history of other mental and behavioral disorders: Secondary | ICD-10-CM | POA: Insufficient documentation

## 2015-10-25 DIAGNOSIS — I10 Essential (primary) hypertension: Secondary | ICD-10-CM | POA: Diagnosis present

## 2015-10-25 DIAGNOSIS — I159 Secondary hypertension, unspecified: Secondary | ICD-10-CM | POA: Insufficient documentation

## 2015-10-25 MED ORDER — AMLODIPINE BESYLATE 10 MG PO TABS: 10.0000 mg | ORAL_TABLET | Freq: Every day | ORAL | Status: DC

## 2015-10-25 NOTE — Discharge Instructions (Signed)
Hypertension Mr. Jerry Carter, see your primary care doctor within 3 days for close follow up of your high blood pressure.  Come back to the ED immediately if any symptoms worsen. Thank you. Hypertension is another name for high blood pressure. High blood pressure forces your heart to work harder to pump blood. A blood pressure reading has two numbers, which includes a higher number over a lower number (example: 110/72). HOME CARE   Have your blood pressure rechecked by your doctor.  Only take medicine as told by your doctor. Follow the directions carefully. The medicine does not work as well if you skip doses. Skipping doses also puts you at risk for problems.  Do not smoke.  Monitor your blood pressure at home as told by your doctor. GET HELP IF:  You think you are having a reaction to the medicine you are taking.  You have repeat headaches or feel dizzy.  You have puffiness (swelling) in your ankles.  You have trouble with your vision. GET HELP RIGHT AWAY IF:   You get a very bad headache and are confused.  You feel weak, numb, or faint.  You get chest or belly (abdominal) pain.  You throw up (vomit).  You cannot breathe very well. MAKE SURE YOU:   Understand these instructions.  Will watch your condition.  Will get help right away if you are not doing well or get worse.   This information is not intended to replace advice given to you by your health care provider. Make sure you discuss any questions you have with your health care provider.   Document Released: 12/23/2007 Document Revised: 07/11/2013 Document Reviewed: 04/28/2013 Elsevier Interactive Patient Education [employer]2016 Elsevier Inc.

## 2015-10-25 NOTE — ED Notes (Signed)
The pt is c/o havinf high bp today.  He took bp med years ago and stopped taking it on his own.  He decided to come in and have it checked

## 2015-10-25 NOTE — ED Provider Notes (Signed)
CSN: [id number]     Arrival date & time 10/25/15  0043 History  By signing my name below, I, [facility], attest that this documentation has been prepared under the direction and in the presence of Tomasita Crumble, MD. Electronically Signed: Randell Patient, ED Scribe. 10/25/2015. 2:41 AM.   Chief Complaint  Patient presents with  . Hypertension   The history is provided by the patient. No language interpreter was used.  HPI Comments: Jerry Carter is a 55 y.o. male who presents to the Emergency Department complaining of constant, elevated BP onset earlier today. Patient reports that he had his BP checked twice today and that it has been elevated both times. He notes that he took BP medication in the past but that he stopped taking this medication when his BP improved. He has an appointment with his PCP Dr. Ronne Binning in the near future but came to the ED to make sure that his life wasn't in danger. Denies any other symptoms currently.  Past Medical History  Diagnosis Date  . Hypertension   . Bipolar disorder (HCC)    History reviewed. No pertinent past surgical history. No family history on file. Social History  Substance Use Topics  . Smoking status: Never Smoker   . Smokeless tobacco: Never Used  . Alcohol Use: No    Review of Systems A complete 10 system review of systems was obtained and all systems are negative except as noted in the HPI and PMH.    Allergies  Review of patient's allergies indicates no known allergies.  Home Medications   Prior to Admission medications   Medication Sig Start Date End Date Taking? Authorizing Provider  fluPHENAZine decanoate (PROLIXIN) 25 MG/ML injection Inject into the muscle every 14 (fourteen) days.   Yes Historical Provider, MD   BP 153/98 mmHg  Pulse 56  Temp(Src) 98 F (36.7 C) (Oral)  Resp 19  Ht  (1.778 m)  Wt 178 lb 6 oz (80.91 kg)  BMI 25.59 kg/m2  SpO2 98% Physical Exam  Constitutional: He is oriented to  person, place, and time. Vital signs are normal. He appears well-developed and well-nourished.  Non-toxic appearance. He does not appear ill. No distress.  HENT:  Head: Normocephalic and atraumatic.  Nose: Nose normal.  Mouth/Throat: Oropharynx is clear and moist. No oropharyngeal exudate.  Eyes: Conjunctivae and EOM are normal. Pupils are equal, round, and reactive to light. No scleral icterus.  Neck: Normal range of motion. Neck supple. No tracheal deviation, no edema, no erythema and normal range of motion present. No thyroid mass and no thyromegaly present.  Cardiovascular: Normal rate, regular rhythm, S1 normal, S2 normal, normal heart sounds, intact distal pulses and normal pulses.  Exam reveals no gallop and no friction rub.   No murmur heard. Pulmonary/Chest: Effort normal and breath sounds normal. No respiratory distress. He has no wheezes. He has no rhonchi. He has no rales.  Abdominal: Soft. Normal appearance and bowel sounds are normal. He exhibits no distension, no ascites and no mass. There is no hepatosplenomegaly. There is no tenderness. There is no rebound, no guarding and no CVA tenderness.  Musculoskeletal: Normal range of motion. He exhibits no edema or tenderness.  Lymphadenopathy:    He has no cervical adenopathy.  Neurological: He is alert and oriented to person, place, and time. He has normal strength. No cranial nerve deficit or sensory deficit.  Skin: Skin is warm, dry and intact. No petechiae and no rash noted. He is not diaphoretic.  No erythema. No pallor.  Psychiatric: He has a normal mood and affect. His behavior is normal. Judgment normal.  Nursing note and vitals reviewed.   ED Course  Procedures   DIAGNOSTIC STUDIES: Oxygen Saturation is 99% on RA, normal by my interpretation.    COORDINATION OF CARE: 2:19 AM Discussed treatment plan with pt at bedside and pt agreed to plan.  Labs Review Labs Reviewed - No data to display  Imaging Review No results  found. I have personally reviewed and evaluated these images and lab results as part of my medical decision-making.   EKG Interpretation None      MDM   Final diagnoses:  Secondary hypertension, unspecified    Patient presents to the ED for HTN.  He is asymptomatic from this, no HTNsive emergency on history or exam.  Will DC with amlodipine to take daily.  PCP fu advised.  He appears well and in NAD.  VS remain within his normal limits and he is safe for DC.   I personally performed the services described in this documentation, which was scribed in my presence. The recorded information has been reviewed and is accurate.     Tomasita CrumbleAdeleke Eliette Drumwright, MD 10/25/15 [phone]

## 2015-11-06 ENCOUNTER — Ambulatory Visit (INDEPENDENT_AMBULATORY_CARE_PROVIDER_SITE_OTHER): Payer: Worker's Compensation | Admitting: Family Medicine

## 2015-11-06 VITALS — BP 124/80 | HR 62 | Temp 97.7°F | Resp 16 | Ht 70.0 in | Wt 180.4 lb

## 2015-11-06 DIAGNOSIS — M25562 Pain in left knee: Secondary | ICD-10-CM | POA: Diagnosis not present

## 2015-11-06 DIAGNOSIS — F209 Schizophrenia, unspecified: Secondary | ICD-10-CM | POA: Diagnosis not present

## 2015-11-06 DIAGNOSIS — S86912D Strain of unspecified muscle(s) and tendon(s) at lower leg level, left leg, subsequent encounter: Secondary | ICD-10-CM | POA: Diagnosis not present

## 2015-11-06 NOTE — Patient Instructions (Addendum)
IF you received an x-ray today, you will receive an invoice from [facility]. Please contact [facility] at [phone] with questions or concerns regarding your invoice.   IF you received labwork today, you will receive an invoice from [employer]. Please contact [facility] at [phone] with questions or concerns regarding your invoice.   Our billing staff will not be able to assist you with questions regarding bills from these companies.  You will be contacted with the lab results as soon as they are available. The fastest way to get your results is to activate your My Chart account. Instructions are located on the last page of this paperwork. If you have not heard from [place] regarding the results in 2 weeks, please contact this office.    EXERCISES  RANGE OF MOTION (ROM) AND STRETCHING EXERCISES -  These exercises may help you when beginning to rehabilitate your injury. Your symptoms may resolve with or without further involvement from your physician, physical therapist or athletic trainer. While completing these exercises, remember:   Restoring tissue flexibility helps normal motion to return to the joints. This allows healthier, less painful movement and activity.  An effective stretch should be held for at least 30 seconds.  A stretch should never be painful. You should only feel a gentle lengthening or release in the stretched tissue. RANGE OF MOTION - Knee Flexion, Active  Lie on your back with both knees straight. (If this causes back discomfort, bend your opposite knee, placing your foot flat on the floor.)  Slowly slide your heel back toward your buttocks until you feel a gentle stretch in the front of your knee or thigh.  Hold for __________ seconds. Slowly slide your heel back to the starting position. Repeat __________ times. Complete this exercise __________ times per day.  STRETCH - Quadriceps, Prone  Lie on your stomach  on a firm surface, such as a bed or padded floor.  Bend your right / left knee and grasp your ankle. If you are unable to reach, your ankle or pant leg, use a belt around your foot to lengthen your reach.  Gently pull your heel toward your buttocks. Your knee should not slide out to the side. You should feel a stretch in the front of your thigh and/or knee.  Hold this position for __________ seconds. Repeat __________ times. Complete this stretch __________ times per day.  STRETCHING - Hip Flexors, Lunge  Half kneel with your right / left knee on the floor and your opposite knee bent and directly over your ankle.  Keep good posture with your head over your shoulders. Tighten your buttocks to point your tailbone downward; this will prevent your back from arching too much.  You should feel a gentle stretch in the front of your thigh and/or hip. If you do not feel any resistance, slightly slide your opposite foot forward and then slowly lunge forward so your knee once again lines up over your ankle. Be sure your tailbone remains pointed downward.  Hold this stretch for __________ seconds. Repeat __________ times. Complete this stretch __________ times per day. STRENGTHENING EXERCISES -  These exercises may help you when beginning to rehabilitate your injury. They may resolve your symptoms with or without further involvement from your physician, physical therapist or athletic trainer. While completing these exercises, remember:   Muscles can gain both the endurance and the strength needed for everyday activities through controlled exercises.  Complete these exercises as instructed by your  physician, physical therapist or athletic trainer. Progress the resistance and repetitions only as guided. STRENGTH - Quadriceps, Isometrics  Lie on your back with your right / left leg extended and your opposite knee bent.  Gradually tense the muscles in the front of your right / left thigh. You should see  either your knee cap slide up toward your hip or increased dimpling just above the knee. This motion will push the back of the knee down toward the floor/mat/bed on which you are lying.  Hold the muscle as tight as you can without increasing your pain for __________ seconds.  Relax the muscles slowly and completely in between each repetition. Repeat __________ times. Complete this exercise __________ times per day.  STRENGTH - Quadriceps, Short Arcs   Lie on your back. Place a __________ inch towel roll under your knee so that the knee slightly bends.  Raise only your lower leg by tightening the muscles in the front of your thigh. Do not allow your thigh to rise.  Hold this position for __________ seconds. Repeat __________ times. Complete this exercise __________ times per day.  OPTIONAL ANKLE WEIGHTS: Begin with ____________________, but DO NOT exceed ____________________. Increase in1 lb/0.5 kg increments. STRENGTH - Quadriceps, Straight Leg Raises  Quality counts! Watch for signs that the quadriceps muscle is working to insure you are strengthening the correct muscles and not "cheating" by substituting with healthier muscles.  Lay on your back with your right / left leg extended and your opposite knee bent.  Tense the muscles in the front of your right / left thigh. You should see either your knee cap slide up or increased dimpling just above the knee. Your thigh may even quiver.  Tighten these muscles even more and raise your leg 4 to 6 inches off the floor. Hold for __________ seconds.  Keeping these muscles tense, lower your leg.  Relax the muscles slowly and completely in between each repetition. Repeat __________ times. Complete this exercise __________ times per day.  STRENGTH - Quadriceps, Wall Slides  Follow guidelines for form closely. Increased knee pain often results from poorly placed feet or knees.  Lean against a smooth wall or door and walk your feet out 18-24  inches. Place your feet hip-width apart.  Slowly slide down the wall or door until your knees bend __________ degrees.* Keep your knees over your heels, not your toes, and in line with your hips, not falling to either side.  Hold for __________ seconds. Stand up to rest for __________ seconds in between each repetition. Repeat __________ times. Complete this exercise __________ times per day. * Your physician, physical therapist or athletic trainer will alter this angle based on your symptoms and progress. STRENGTH - Quadriceps, Step-Ups   Use a thick book, step or step stool that is __________ inches tall.  Holding a wall or counter for balance only, not support.  Slowly step-up with your right / left foot, keeping your knee in line with your hip and foot. Do not allow your knee to bend so far that you cannot see your toes.  Slowly unlock your knee and lower yourself to the starting position. Your muscles, not gravity, should lower you. Repeat __________ times. Complete this exercise __________ times per day.   This information is not intended to replace advice given to you by your health care provider. Make sure you discuss any questions you have with your health care provider.   Document Released: 07/06/2005 Document Revised: 11/20/2014 Document Reviewed: 10/18/2008  Chartered certified accountant Patient Education [employer].

## 2015-11-06 NOTE — Progress Notes (Signed)
   Subjective:  By signing my name below, I, Stann Oresung-Kai Tsai, attest that this documentation has been prepared under the direction and in the presence of Norberto SorensonEva Shaw, MD. Electronically Signed: Stann Oresung-Kai Tsai, Scribe. 11/06/2015 , 4:39 PM .  Patient was seen in Room 9 .   Patient ID: Jerry Carter, male    DOB: 03/02/1961, 55 y.o.   MRN: [id number] Chief Complaint  Patient presents with  . Follow-up    WC follow up Left Knee   . Hypertension    Per pt does not wish to start taking Amlodipine has been doing all natural home remedies    HPI Jerry Carter is a 55 y.o. male who presents to [facility] for worker's comp follow up.  Here for left leg injury that occurred while at work. Date of of injury approx 09/23/15.   Patient states he still has occasional popping in his left knee. But he denies any pain in his left knee. He's been going to [employer] for leg exercises and working with a Systems analystpersonal trainer. He denies taking meloxicam. He denies wearing his knee brace while at work. He has been back to full duty at work, and being careful while at work.   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   Prior to Admission medications   Medication Sig Start Date End Date Taking? Authorizing Provider  fluPHENAZine decanoate (PROLIXIN) 25 MG/ML injection Inject into the muscle every 14 (fourteen) days.    Historical Provider, MD   No Known Allergies  Review of Systems  Constitutional: Negative for fever, chills and fatigue.  Gastrointestinal: Negative for nausea and vomiting.  Musculoskeletal: Negative for myalgias, joint swelling, arthralgias and gait problem.  Skin: Negative for rash and wound.  Neurological: Negative for dizziness, weakness and numbness.       Objective:   Physical Exam  Constitutional: He is oriented to person, place, and time. He appears well-developed and well-nourished. No distress.  HENT:  Head: Normocephalic and atraumatic.  Eyes: EOM are normal. Pupils are equal,  round, and reactive to light.  Neck: Neck supple.  Cardiovascular: Normal rate.   Pulmonary/Chest: Effort normal. No respiratory distress.  Musculoskeletal: Normal range of motion.  Left knee: Full rom, no crepitous, no effusion, no jointline tenderness, no weakness with varus or valgus stress  Neurological: He is alert and oriented to person, place, and time.  Skin: Skin is warm and dry.  Psychiatric: He has a normal mood and affect. His behavior is normal.  Nursing note and vitals reviewed.  BP 124/80 mmHg  Pulse 62  Temp(Src) 97.7 F (36.5 C) (Oral)  Resp 16  Ht 5\' 10"  (1.778 m)  Wt 180 lb 6.4 oz (81.829 kg)  BMI 25.88 kg/m2  SpO2 99%    Assessment & Plan:   1. Left knee pain   2. Strain of left knee, subsequent encounter   F/u workers comp eval from injury on 09/23/15. Sxs almost completely resolved so ok to RTW at full-duty, no restrictions. F/u prn.   I personally performed the services described in this documentation, which was scribed in my presence. The recorded information has been reviewed and considered, and addended by me as needed.  Norberto SorensonEva Shaw, MD MPH

## 2015-11-21 DIAGNOSIS — Z0000 Encounter for general adult medical examination without abnormal findings: Secondary | ICD-10-CM | POA: Diagnosis not present

## 2015-12-04 DIAGNOSIS — F209 Schizophrenia, unspecified: Secondary | ICD-10-CM | POA: Diagnosis not present

## 2015-12-26 DIAGNOSIS — F209 Schizophrenia, unspecified: Secondary | ICD-10-CM | POA: Diagnosis not present

## 2016-01-09 DIAGNOSIS — F209 Schizophrenia, unspecified: Secondary | ICD-10-CM | POA: Diagnosis not present

## 2016-01-23 DIAGNOSIS — F209 Schizophrenia, unspecified: Secondary | ICD-10-CM | POA: Diagnosis not present

## 2016-02-06 DIAGNOSIS — F209 Schizophrenia, unspecified: Secondary | ICD-10-CM | POA: Diagnosis not present

## 2016-02-27 DIAGNOSIS — F209 Schizophrenia, unspecified: Secondary | ICD-10-CM | POA: Diagnosis not present

## 2016-03-17 DIAGNOSIS — F209 Schizophrenia, unspecified: Secondary | ICD-10-CM | POA: Diagnosis not present

## 2016-04-02 DIAGNOSIS — F209 Schizophrenia, unspecified: Secondary | ICD-10-CM | POA: Diagnosis not present

## 2016-04-21 DIAGNOSIS — F209 Schizophrenia, unspecified: Secondary | ICD-10-CM | POA: Diagnosis not present

## 2016-05-07 DIAGNOSIS — F209 Schizophrenia, unspecified: Secondary | ICD-10-CM | POA: Diagnosis not present

## 2016-05-19 DIAGNOSIS — F209 Schizophrenia, unspecified: Secondary | ICD-10-CM | POA: Diagnosis not present

## 2016-06-04 DIAGNOSIS — F209 Schizophrenia, unspecified: Secondary | ICD-10-CM | POA: Diagnosis not present

## 2016-06-18 DIAGNOSIS — F209 Schizophrenia, unspecified: Secondary | ICD-10-CM | POA: Diagnosis not present

## 2016-07-03 DIAGNOSIS — F209 Schizophrenia, unspecified: Secondary | ICD-10-CM | POA: Diagnosis not present

## 2016-07-08 DIAGNOSIS — F209 Schizophrenia, unspecified: Secondary | ICD-10-CM | POA: Diagnosis not present

## 2016-07-23 DIAGNOSIS — F209 Schizophrenia, unspecified: Secondary | ICD-10-CM | POA: Diagnosis not present

## 2016-08-13 DIAGNOSIS — F209 Schizophrenia, unspecified: Secondary | ICD-10-CM | POA: Diagnosis not present

## 2016-08-27 DIAGNOSIS — R69 Illness, unspecified: Secondary | ICD-10-CM | POA: Diagnosis not present

## 2016-08-27 DIAGNOSIS — F209 Schizophrenia, unspecified: Secondary | ICD-10-CM | POA: Diagnosis not present

## 2016-09-10 DIAGNOSIS — F209 Schizophrenia, unspecified: Secondary | ICD-10-CM | POA: Diagnosis not present

## 2016-09-10 DIAGNOSIS — R69 Illness, unspecified: Secondary | ICD-10-CM | POA: Diagnosis not present

## 2016-09-11 ENCOUNTER — Emergency Department (HOSPITAL_COMMUNITY)
Admission: EM | Admit: 2016-09-11 | Discharge: 2016-09-11 | Disposition: A | Discharge status: Home / Self Care | Payer: Medicare HMO | Attending: Emergency Medicine | Admitting: Emergency Medicine

## 2016-09-11 ENCOUNTER — Emergency Department (HOSPITAL_COMMUNITY): Payer: Medicare HMO

## 2016-09-11 ENCOUNTER — Encounter (HOSPITAL_COMMUNITY): Payer: Self-pay | Admitting: *Deleted

## 2016-09-11 DIAGNOSIS — I10 Essential (primary) hypertension: Secondary | ICD-10-CM | POA: Diagnosis not present

## 2016-09-11 DIAGNOSIS — Z79899 Other long term (current) drug therapy: Secondary | ICD-10-CM | POA: Diagnosis not present

## 2016-09-11 DIAGNOSIS — R52 Pain, unspecified: Secondary | ICD-10-CM

## 2016-09-11 DIAGNOSIS — M25551 Pain in right hip: Secondary | ICD-10-CM | POA: Diagnosis not present

## 2016-09-11 DIAGNOSIS — M5431 Sciatica, right side: Secondary | ICD-10-CM | POA: Diagnosis not present

## 2016-09-11 LAB — URINALYSIS, ROUTINE W REFLEX MICROSCOPIC
Bilirubin Urine: NEGATIVE
GLUCOSE, UA: NEGATIVE mg/dL
HGB URINE DIPSTICK: NEGATIVE
Ketones, ur: NEGATIVE mg/dL
Leukocytes, UA: NEGATIVE
Nitrite: NEGATIVE
PH: 8 (ref 5.0–8.0)
Protein, ur: NEGATIVE mg/dL
SPECIFIC GRAVITY, URINE: 1.009 (ref 1.005–1.030)

## 2016-09-11 LAB — CBG MONITORING, ED: Glucose-Capillary: 101 mg/dL — ABNORMAL HIGH (ref 65–99)

## 2016-09-11 MED ORDER — IBUPROFEN 800 MG PO TABS: 800.0000 mg | ORAL_TABLET | Freq: Three times a day (TID) | ORAL | 0 refills | Status: DC | PRN

## 2016-09-11 MED ORDER — IBUPROFEN 800 MG PO TABS: 800.0000 mg | ORAL_TABLET | Freq: Once | ORAL | Status: DC

## 2016-09-11 NOTE — ED Notes (Signed)
Urinal provided for sample

## 2016-09-11 NOTE — ED Notes (Signed)
Patient transported to X-ray 

## 2016-09-11 NOTE — ED Notes (Signed)
Pt departed in NAD, refused use of wheelchair.  

## 2016-09-11 NOTE — ED Triage Notes (Signed)
Pt c/o R hip pain for several days. Reports the pain is intermittent and may be "related to me drinking a lot of sugar." States "if I drink a lot of water it'll flush my system but tonight I threw up." Denies abd pain or nausea at present

## 2016-09-11 NOTE — ED Provider Notes (Signed)
TIME SEEN: 4:30 AM  CHIEF COMPLAINT: Right posterior hip pain  HPI: Patient is a 56 year old male with history of hypertension, paranoid schizophrenia who presents emergency room with several days of right posterior hip pain. Pain is worse with ambulation. States it is feeling better since lying in the bed. Denies any injury to this area. Denies any fevers, vomiting, diarrhea, dysuria or hematuria. No abdominal pain. No numbness, tingling or focal weakness. No bowel or bladder incontinence. Has never had similar symptoms. States pain is "better since speaking my healing". Patient reports he has been reciting Bible verses and states that his pain is better. He states that he thinks his pain is from "drinking too much soda and eating too much sugar". He does not have a history of diabetes. No history of gout. No history of septic arthritis, IV drug abuse. Patient denies SI, HI or hallucinations. He states that he feels he is doing well from a mental health standpoint. He did receive an injection of his Prolixin yesterday but states he received it in the left upper arm.  ROS: See HPI Constitutional: no fever  Eyes: no drainage  ENT: no runny nose   Cardiovascular:  no chest pain  Resp: no SOB  GI: no vomiting GU: no dysuria Integumentary: no rash  Allergy: no hives  Musculoskeletal: no leg swelling  Neurological: no slurred speech ROS otherwise negative  PAST MEDICAL HISTORY/PAST SURGICAL HISTORY:  Past Medical History:  Diagnosis Date  . Bipolar disorder (HCC)   . Hypertension     MEDICATIONS:  Prior to Admission medications   Medication Sig Start Date End Date Taking? Authorizing Provider  fluPHENAZine decanoate (PROLIXIN) 25 MG/ML injection Inject into the muscle every 14 (fourteen) days.    Historical Provider, MD    ALLERGIES:  No Known Allergies  SOCIAL HISTORY:  Social History  Substance Use Topics  . Smoking status: Never Smoker  . Smokeless tobacco: Never Used  .  Alcohol use No    FAMILY HISTORY: No family history on file.  EXAM: BP (!) 176/107 (BP Location: Right Arm)   Pulse (!) 57   Temp 97.9 F (36.6 C) (Oral)   Resp 18   Ht 5\' 10"  (1.778 m)   Wt 176 lb (79.8 kg)   SpO2 100%   BMI 25.25 kg/m  CONSTITUTIONAL: Alert and oriented and responds appropriately to questions. Well-appearing; well-nourished HEAD: Normocephalic EYES: Conjunctivae clear, PERRL, EOMI ENT: normal nose; no rhinorrhea; moist mucous membranes NECK: Supple, no meningismus, no nuchal rigidity, no LAD  CARD: RRR; S1 and S2 appreciated; no murmurs, no clicks, no rubs, no gallops RESP: Normal chest excursion without splinting or tachypnea; breath sounds clear and equal bilaterally; no wheezes, no rhonchi, no rales, no hypoxia or respiratory distress, speaking full sentences ABD/GI: Normal bowel sounds; non-distended; soft, non-tender, no rebound, no guarding, no peritoneal signs, no hepatosplenomegaly BACK:  The back appears normal and is non-tender to palpation, there is no CVA tenderness, No midline spinal tenderness or step-off or deformity EXT: Patient has some tenderness over the right posterior gluteus area but no erythema, warmth or swelling. No joint effusion noted to the right hip. He has full range of motion in the hip. Otherwise right leg is nontender to palpation. 2+ DP pulses bilaterally. Normal ROM in all joints; non-tender to palpation; no edema; normal capillary refill; no cyanosis, no calf tenderness or swelling    SKIN: Normal color for age and race; warm; no rash NEURO: Moves all extremities equally, sensation  to light touch intact diffusely, cranial nerves II through XII intact, normal speech, no saddle anesthesia, normal gait PSYCH: The patient's mood and manner are appropriate. Grooming and personal hygiene are appropriate.  MEDICAL DECISION MAKING: Patient here with right posterior hip pain. Suspect this could be sciatica. Have offered ibuprofen but  patient declined stating he is ready feeling better. Doubt septic arthritis of the hip, gout. Will obtain x-ray for further evaluation given exam and history are somewhat limited given his mental illness. He denies any history of injury. He appears to be neurovascularly intact distally. Doubt DVT. Nothing to suggest arterial obstruction. He has no midline back tenderness on exam. Doubt cauda equina, spinal stenosis, epidural abscess or hematoma, discitis or transverse myelitis. Low suspicion for kidney stone or kidney infection but will obtain urinalysis. We will also check a CBG given patient states he thinks that this could be from eating too much sugar recently he does not have a history of diabetes.  ED PROGRESS: Patient's urine shows no sign of infection and no blood. Blood glucose is normal. X-ray is normal. He states he is feeling well and has been able to ambulate. I feel he is safe to be discharged home with outpatient follow-up with his primary care provider. We'll discharge with prescription of ibuprofen. Again suspect that this is sciatica. Discussed return precautions with patient. He is comfortable with this plan.   At this time, I do not feel there is any life-threatening condition present. I have reviewed and discussed all results (EKG, imaging, lab, urine as appropriate) and exam findings with patient/family. I have reviewed nursing notes and appropriate previous records.  I feel the patient is safe to be discharged home without further emergent workup and can continue workup as an outpatient as needed. Discussed usual and customary return precautions. Patient/family verbalize understanding and are comfortable with this plan.  Outpatient follow-up has been provided. All questions have been answered.      Layla MawKristen N Yotam Rhine, DO 09/11/16 [phone]

## 2016-10-01 DIAGNOSIS — F209 Schizophrenia, unspecified: Secondary | ICD-10-CM | POA: Diagnosis not present

## 2016-10-01 DIAGNOSIS — R69 Illness, unspecified: Secondary | ICD-10-CM | POA: Diagnosis not present

## 2016-10-15 DIAGNOSIS — R69 Illness, unspecified: Secondary | ICD-10-CM | POA: Diagnosis not present

## 2016-10-29 DIAGNOSIS — R69 Illness, unspecified: Secondary | ICD-10-CM | POA: Diagnosis not present

## 2016-11-12 DIAGNOSIS — R69 Illness, unspecified: Secondary | ICD-10-CM | POA: Diagnosis not present

## 2016-11-21 IMAGING — CR DG KNEE COMPLETE 4+V*L*
3 series · 3 of 3 positions shown · non-contrast
Comparison: None.

CLINICAL DATA: Left anterior knee pain

EXAM:
LEFT KNEE - COMPLETE 4+ VIEW

[AP]
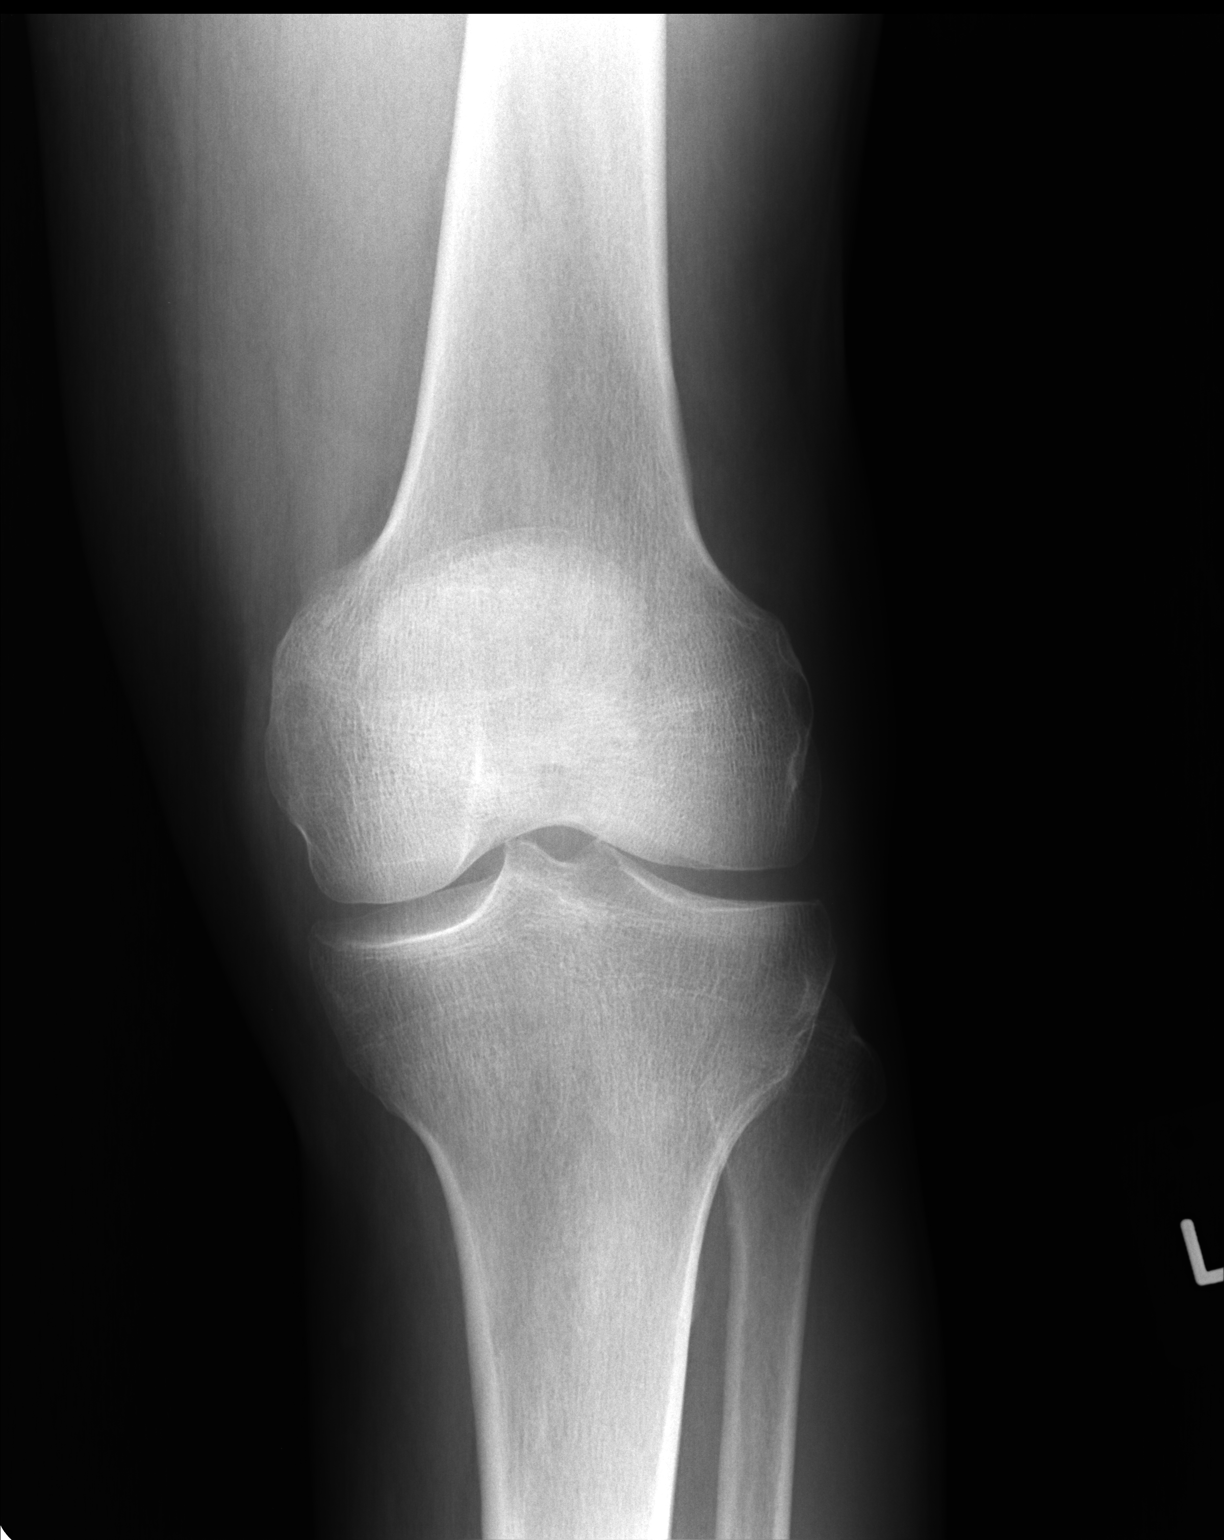

[ap int rot]
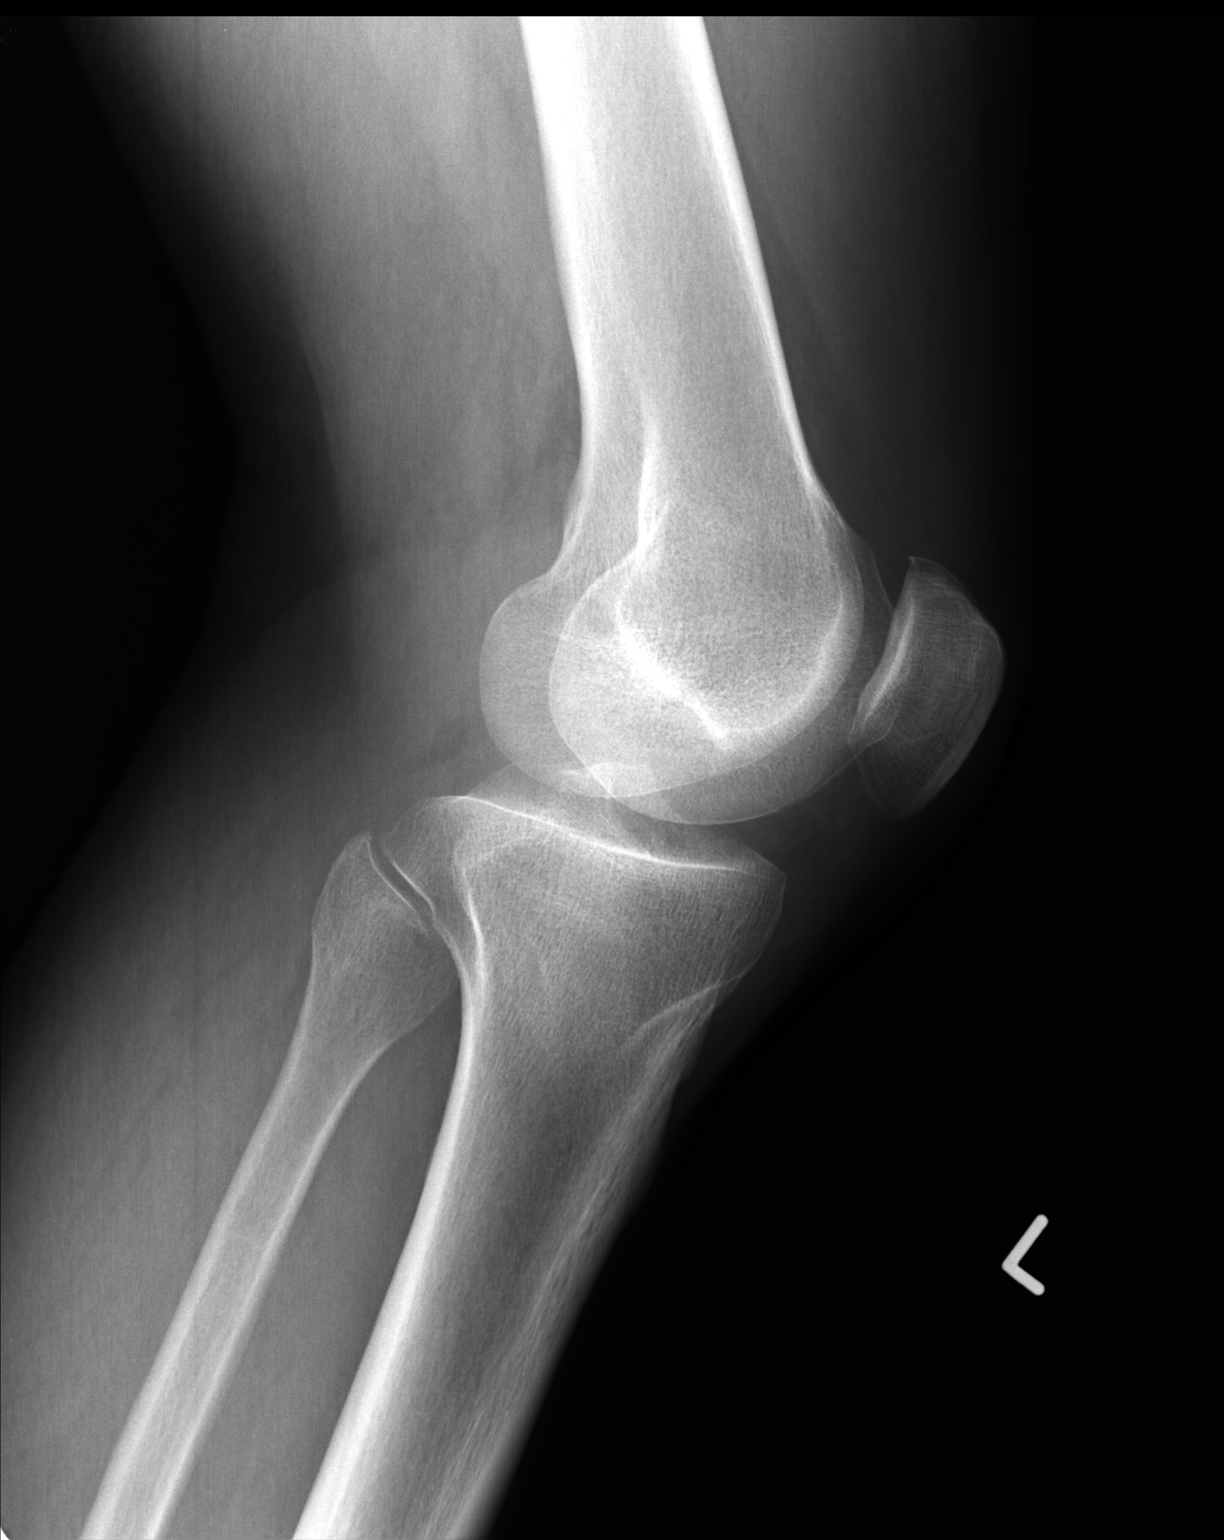

[lateral]
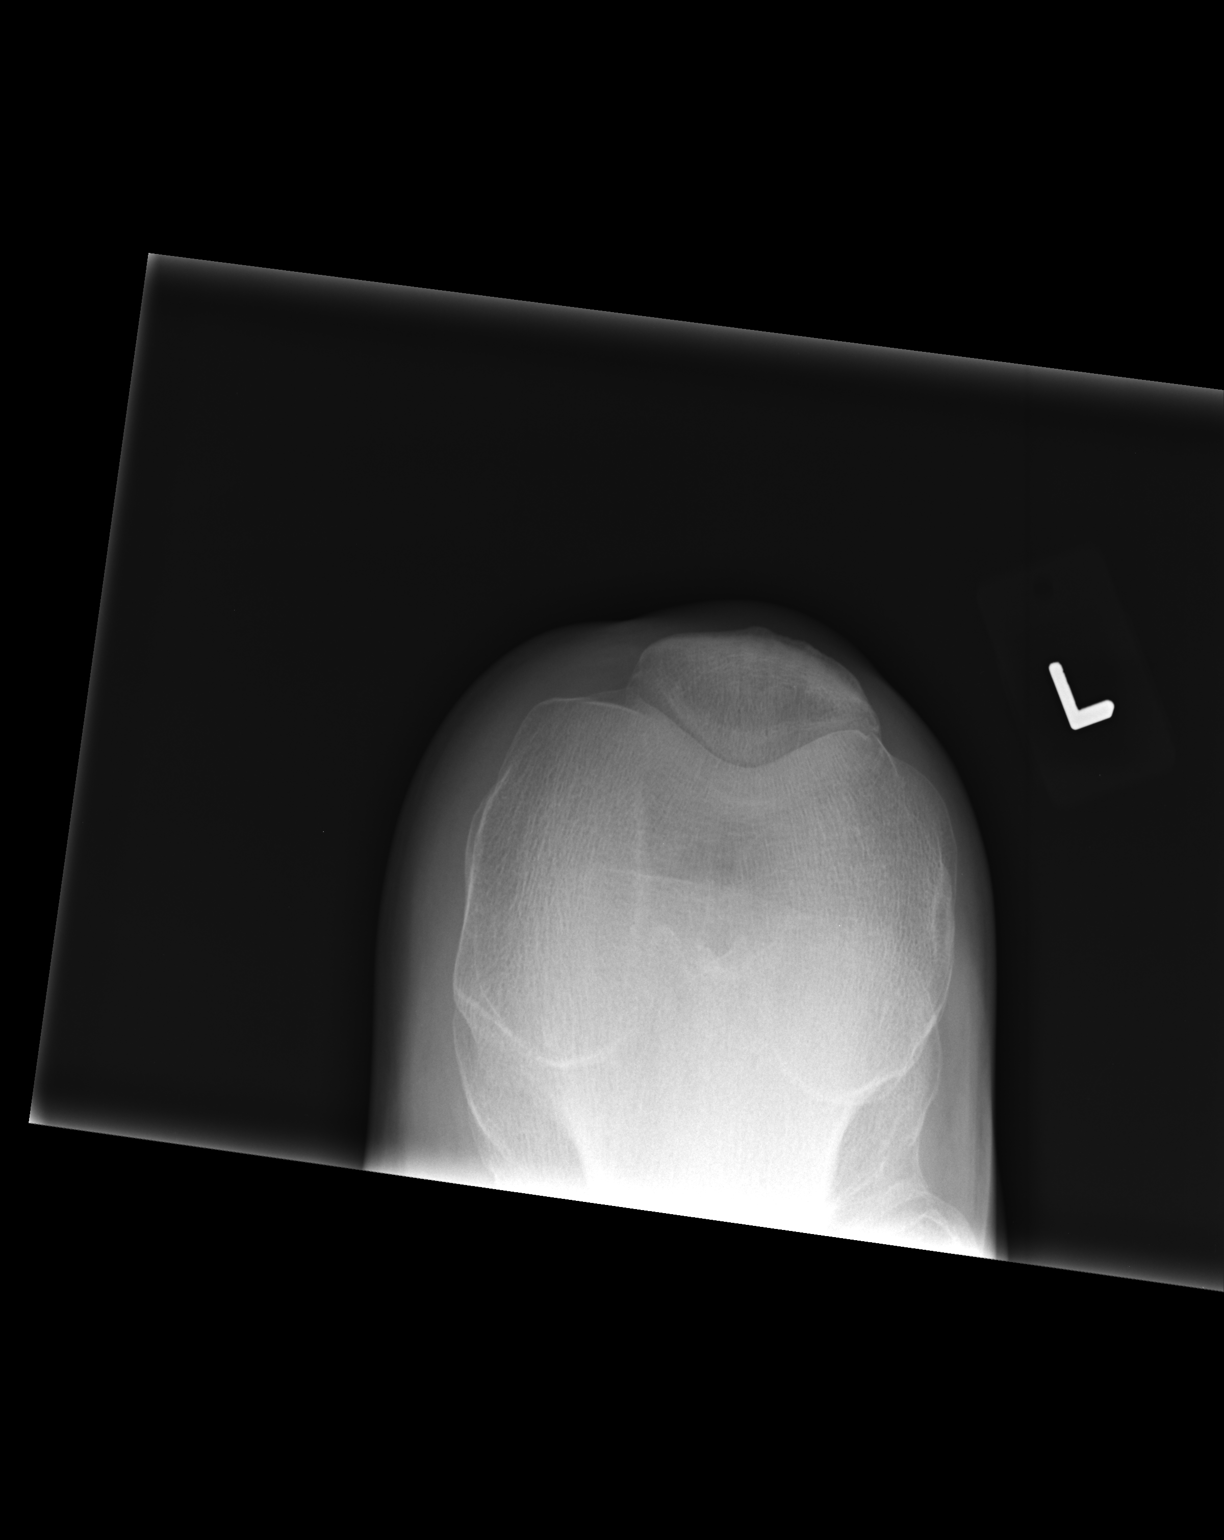

[3 of 3 positions shown; findings below may reference images not displayed]

FINDINGS: No acute fracture. No dislocation.  Unremarkable soft tissues.
IMPRESSION: No acute bony pathology.

## 2016-11-26 DIAGNOSIS — R69 Illness, unspecified: Secondary | ICD-10-CM | POA: Diagnosis not present

## 2016-12-04 DIAGNOSIS — R69 Illness, unspecified: Secondary | ICD-10-CM | POA: Diagnosis not present

## 2016-12-10 DIAGNOSIS — R69 Illness, unspecified: Secondary | ICD-10-CM | POA: Diagnosis not present

## 2016-12-31 DIAGNOSIS — R69 Illness, unspecified: Secondary | ICD-10-CM | POA: Diagnosis not present

## 2017-01-14 DIAGNOSIS — R69 Illness, unspecified: Secondary | ICD-10-CM | POA: Diagnosis not present

## 2017-01-28 DIAGNOSIS — R69 Illness, unspecified: Secondary | ICD-10-CM | POA: Diagnosis not present

## 2017-02-18 DIAGNOSIS — R69 Illness, unspecified: Secondary | ICD-10-CM | POA: Diagnosis not present

## 2017-03-04 ENCOUNTER — Ambulatory Visit (INDEPENDENT_AMBULATORY_CARE_PROVIDER_SITE_OTHER): Payer: Medicare HMO | Admitting: Family Medicine

## 2017-03-04 VITALS — BP 140/85 | HR 76 | Temp 98.0°F | Ht 70.0 in | Wt 168.4 lb

## 2017-03-04 DIAGNOSIS — Z114 Encounter for screening for human immunodeficiency virus [HIV]: Secondary | ICD-10-CM | POA: Diagnosis not present

## 2017-03-04 DIAGNOSIS — Z1159 Encounter for screening for other viral diseases: Secondary | ICD-10-CM

## 2017-03-04 DIAGNOSIS — F259 Schizoaffective disorder, unspecified: Secondary | ICD-10-CM | POA: Diagnosis not present

## 2017-03-04 DIAGNOSIS — Z1211 Encounter for screening for malignant neoplasm of colon: Secondary | ICD-10-CM

## 2017-03-04 DIAGNOSIS — E785 Hyperlipidemia, unspecified: Secondary | ICD-10-CM | POA: Diagnosis not present

## 2017-03-04 DIAGNOSIS — R69 Illness, unspecified: Secondary | ICD-10-CM | POA: Diagnosis not present

## 2017-03-04 DIAGNOSIS — R739 Hyperglycemia, unspecified: Secondary | ICD-10-CM

## 2017-03-04 DIAGNOSIS — R350 Frequency of micturition: Secondary | ICD-10-CM | POA: Diagnosis not present

## 2017-03-04 LAB — POCT GLYCOSYLATED HEMOGLOBIN (HGB A1C): HEMOGLOBIN A1C: 5.3

## 2017-03-04 MED ORDER — TETANUS-DIPHTH-ACELL PERTUSSIS 5-2.5-18.5 LF-MCG/0.5 IM SUSP: 0.5000 mL | Freq: Once | INTRAMUSCULAR | 0 refills | Status: AC

## 2017-03-04 NOTE — Patient Instructions (Addendum)
Jerry Carter, you were seen today to establish care.  There are number of labs will be getting today, including screening for HIV, hepatitis C, cholesterol and checking for diabetes.  I have also put in a referral for colonoscopy and you are due for a tetanus shot. Also provided you with tetanus prescription.   Very nice meeting you today.  Will get in touch about lab results.   Take care, Daniel L. Myrtie SomanWarden, MD [facility] Resident PGY-2 03/04/2017 3:21 PM

## 2017-03-04 NOTE — Progress Notes (Signed)
Subjective:  Jerry Carter is a 57 y.o. male who presents to the [facility] today to establish care  HPI:  Jerry Carter is a 56yo M here today to establish care. He is a history significant for schizoaffective disorder for which he receives fluphenazine shot once every 14 days and is followed by a "Dr. Josph Macho" at [facility].  Reports that his symptoms are maintained with this medication.  Previously seen by Dr. Ricke Hey and last seen in 2016.  Denies any CP, SOB, trouble with urination or defecation.  Does have some nocturia without blood. Denies sexual activity, dysuria, polyphagia or polydipsia. "Loves sweet tea".    PMH: schizoaffective disorder Tobacco use: former smoker when he was 13 Medication: reviewed and updated ROS: see HPI   Objective:  Physical Exam: BP 140/85 (BP Location: Right Arm, Patient Position: Sitting, Cuff Size: Normal)   Pulse 76   Temp 98 F (36.7 C) (Oral)   Ht _0  (1.778 m)   Wt 168 lb 6.4 oz (76.4 kg)   SpO2 96%   BMI 24.16 kg/m   Gen: 56 yo in NAD, resting comfortably CV: RRR with no murmurs appreciated Pulm: NWOB, CTAB with no crackles, wheezes, or rhonchi GI: Normal bowel sounds present. Soft, Nontender, Nondistended. MSK: no edema, cyanosis, or clubbing noted Skin: warm, dry Neuro: grossly normal, moves all extremities Psych: Normal affect and thought content  Results for orders placed or performed in visit on 03/04/17 (from the past 72 hour(s))  POCT glycosylated hemoglobin (Hb A1C)     Status: None   Collection Time: 03/04/17  3:15 PM  Result Value Ref Range   Hemoglobin A1C 5.3   CBC     Status: None   Collection Time: 03/04/17  3:23 PM  Result Value Ref Range   WBC 4.3 3.4 - 10.8 x10E3/uL   RBC 4.88 4.14 - 5.80 x10E6/uL   Hemoglobin 13.2 13.0 - 17.7 g/dL   Hematocrit 39.1 37.5 - 51.0 %   MCV 80 79 - 97 fL   MCH 27.0 26.6 - 33.0 pg   MCHC 33.8 31.5 - 35.7 g/dL   RDW 14.3 12.3 - 15.4 %   Platelets 180 150 - 379 x10E3/uL  CMP14+EGFR      Status: None   Collection Time: 03/04/17  3:23 PM  Result Value Ref Range   Glucose 82 65 - 99 mg/dL   BUN 12 6 - 24 mg/dL   Creatinine, Ser 1.20 0.76 - 1.27 mg/dL   GFR calc non Af Amer 66 >59 mL/min/1.73   GFR calc Af Amer 77 >59 mL/min/1.73   BUN/Creatinine Ratio 10 9 - 20   Sodium 142 134 - 144 mmol/L   Potassium 4.4 3.5 - 5.2 mmol/L   Chloride 101 96 - 106 mmol/L   CO2 26 20 - 29 mmol/L   Calcium 9.2 8.7 - 10.2 mg/dL   Total Protein 7.3 6.0 - 8.5 g/dL   Albumin 4.3 3.5 - 5.5 g/dL   Globulin, Total 3.0 1.5 - 4.5 g/dL   Albumin/Globulin Ratio 1.4 1.2 - 2.2   Bilirubin Total 0.4 0.0 - 1.2 mg/dL   Alkaline Phosphatase 50 39 - 117 IU/L   AST 22 0 - 40 IU/L   ALT 15 0 - 44 IU/L  HIV antibody     Status: None   Collection Time: 03/04/17  3:23 PM  Result Value Ref Range   HIV Screen 4th Generation wRfx Non Reactive Non Reactive  Hepatitis C antibody  Status: None   Collection Time: 03/04/17  3:23 PM  Result Value Ref Range   Hep C Virus Ab <0.1 0.0 - 0.9 s/co ratio    Comment:                                   Negative:     < 0.8                              Indeterminate: 0.8 - 0.9                                   Positive:     > 0.9  The CDC recommends that a positive HCV antibody result  be followed up with a HCV Nucleic Acid Amplification  test (323557).   Lipid panel     Status: Abnormal   Collection Time: 03/04/17  3:23 PM  Result Value Ref Range   Cholesterol, Total 173 100 - 199 mg/dL   Triglycerides 27 0 - 149 mg/dL   HDL 53 >39 mg/dL   VLDL Cholesterol Cal 5 5 - 40 mg/dL   LDL Calculated 115 (H) 0 - 99 mg/dL   Chol/HDL Ratio 3.3 0.0 - 5.0 ratio    Comment:                                   T. Chol/HDL Ratio                                             Men  Women                               1/2 Avg.Risk  3.4    3.3                                   Avg.Risk  5.0    4.4                                2X Avg.Risk  9.6    7.1                                 3X Avg.Risk 23.4   11.0      Assessment/Plan:  Schizoaffective disorder Denies suicidal ideation or intention. Feels that current medication is helping symptoms and is continuing to be followed up at [facility]. - Continue current regimen   Hyperlipidemia ASCVD risk >8.5%.  Patient without other risk factors, but should be started on mod-high intensity statin. Called patient who agreed to start lipitor '40mg'$  daily - will start lipitor '40mg'$  daily - recheck LDL in 65mo Health maintenance: Patient has not seen primary doctor since 2016 and due for a number of maintenance items.  Needed HIV and hep C screening.  Both returned normal.  Also due for tetanus/TDAP, which he received.  Also due for colonoscopy and referral was placed. Checked lipid panel and LDL returned 113 and CBC/BMP were WNL.   - follow up colonoscopy - follow up in 50mofor LDL and for check up

## 2017-03-04 NOTE — Assessment & Plan Note (Signed)
Denies suicidal ideation or intention. Feels that current medication is helping symptoms and is continuing to be [facility]. - Continue current regimen

## 2017-03-05 LAB — CMP14+EGFR
ALT: 15 IU/L (ref 0–44)
AST: 22 IU/L (ref 0–40)
Albumin/Globulin Ratio: 1.4 (ref 1.2–2.2)
Albumin: 4.3 g/dL (ref 3.5–5.5)
Alkaline Phosphatase: 50 IU/L (ref 39–117)
BUN/Creatinine Ratio: 10 (ref 9–20)
BUN: 12 mg/dL (ref 6–24)
Bilirubin Total: 0.4 mg/dL (ref 0.0–1.2)
CALCIUM: 9.2 mg/dL (ref 8.7–10.2)
CO2: 26 mmol/L (ref 20–29)
CREATININE: 1.2 mg/dL (ref 0.76–1.27)
Chloride: 101 mmol/L (ref 96–106)
GFR, EST AFRICAN AMERICAN: 77 mL/min/{1.73_m2} (ref >59)
GFR, EST NON AFRICAN AMERICAN: 66 mL/min/{1.73_m2} (ref >59)
GLUCOSE: 82 mg/dL (ref 65–99)
Globulin, Total: 3 g/dL (ref 1.5–4.5)
Potassium: 4.4 mmol/L (ref 3.5–5.2)
Sodium: 142 mmol/L (ref 134–144)
TOTAL PROTEIN: 7.3 g/dL (ref 6.0–8.5)

## 2017-03-05 LAB — HIV ANTIBODY (ROUTINE TESTING W REFLEX): HIV Screen 4th Generation wRfx: NONREACTIVE

## 2017-03-05 LAB — CBC
Hematocrit: 39.1 % (ref 37.5–51.0)
Hemoglobin: 13.2 g/dL (ref 13.0–17.7)
MCH: 27 pg (ref 26.6–33.0)
MCHC: 33.8 g/dL (ref 31.5–35.7)
MCV: 80 fL (ref 79–97)
PLATELETS: 180 10*3/uL (ref 150–379)
RBC: 4.88 x10E6/uL (ref 4.14–5.80)
RDW: 14.3 % (ref 12.3–15.4)
WBC: 4.3 10*3/uL (ref 3.4–10.8)

## 2017-03-05 LAB — LIPID PANEL
Chol/HDL Ratio: 3.3 ratio (ref 0.0–5.0)
Cholesterol, Total: 173 mg/dL (ref 100–199)
HDL: 53 mg/dL (ref >39)
LDL CALC: 115 mg/dL — AB (ref 0–99)
TRIGLYCERIDES: 27 mg/dL (ref 0–149)
VLDL CHOLESTEROL CAL: 5 mg/dL (ref 5–40)

## 2017-03-05 LAB — HEPATITIS C ANTIBODY: Hep C Virus Ab: <0.1 s/co ratio (ref 0.0–0.9)

## 2017-03-06 DIAGNOSIS — E785 Hyperlipidemia, unspecified: Secondary | ICD-10-CM | POA: Insufficient documentation

## 2017-03-06 NOTE — Assessment & Plan Note (Signed)
ASCVD risk >8.5%.  Patient without other risk factors, but should be started on mod-high intensity statin. Called patient who agreed to start lipitor 40mg  daily - will start lipitor 40mg  daily - recheck LDL in 17mo

## 2017-03-18 ENCOUNTER — Telehealth: Payer: Self-pay

## 2017-03-18 DIAGNOSIS — R69 Illness, unspecified: Secondary | ICD-10-CM | POA: Diagnosis not present

## 2017-03-18 NOTE — Telephone Encounter (Signed)
Patient called and said that he was told by Dr.Warden at his last visit to start taking Lipitor but that when he went to the pharmacy to pick it up, it was not there. Patient is asking for a prescription to be called or faxed to his pharmacy.Glennie HawkSimpson, Michelle R

## 2017-03-18 NOTE — Telephone Encounter (Signed)
Pharmacy is [facility] [address], [place]. Sunday SpillersSharon T Lindley Stachnik, CMA

## 2017-03-19 ENCOUNTER — Other Ambulatory Visit: Payer: Self-pay | Admitting: Family Medicine

## 2017-03-19 MED ORDER — ATORVASTATIN CALCIUM 40 MG PO TABS: 40.0000 mg | ORAL_TABLET | Freq: Every day | ORAL | 3 refills | Status: DC

## 2017-03-19 MED ORDER — ATORVASTATIN CALCIUM 40 MG PO TABS
40.0000 mg | ORAL_TABLET | Freq: Every day | ORAL | 3 refills | Status: DC
Start: 2017-03-19 — End: 2017-12-09

## 2017-03-19 NOTE — Telephone Encounter (Signed)
Called patient to let him know I put the order in.  Praneel Haisley L. Myrtie SomanWarden, MD [facility] Resident PGY-2 03/19/2017 3:52 PM

## 2017-04-01 DIAGNOSIS — R69 Illness, unspecified: Secondary | ICD-10-CM | POA: Diagnosis not present

## 2017-04-12 ENCOUNTER — Encounter: Payer: Self-pay | Admitting: Gastroenterology

## 2017-04-15 DIAGNOSIS — R69 Illness, unspecified: Secondary | ICD-10-CM | POA: Diagnosis not present

## 2017-04-16 ENCOUNTER — Ambulatory Visit (INDEPENDENT_AMBULATORY_CARE_PROVIDER_SITE_OTHER): Payer: Medicare HMO | Admitting: Family Medicine

## 2017-04-16 ENCOUNTER — Encounter: Payer: Self-pay | Admitting: Family Medicine

## 2017-04-16 VITALS — BP 130/60 | HR 66 | Temp 97.8°F | Ht 70.0 in | Wt 170.2 lb

## 2017-04-16 DIAGNOSIS — Z1211 Encounter for screening for malignant neoplasm of colon: Secondary | ICD-10-CM | POA: Diagnosis not present

## 2017-04-16 DIAGNOSIS — Z23 Encounter for immunization: Secondary | ICD-10-CM

## 2017-04-16 DIAGNOSIS — I10 Essential (primary) hypertension: Secondary | ICD-10-CM

## 2017-04-16 NOTE — Patient Instructions (Addendum)
Yann, you were seen for follow-up to check your blood pressure today because it was mildly elevated last visit.  It is normal today and you do not need to be on any blood pressure medication.  We also gave you a flu shot today.   Please make sure you going to colonoscopy and follow-up next August for a checkup.  Nice to see you today, Rande Brunt. Myrtie Soman, MD [facility] Resident PGY-2 04/16/2017 11:14 AM

## 2017-04-20 NOTE — Progress Notes (Signed)
    Subjective:  Jerry Carter is a 56 y.o. male who presents to the [facility] today for blood pressure follow up.   HPI:  Elevated blood pressure with history of HTN:  Blood pressures were borderline high at last visit based on JNC-8.  Denies any chest pain, SOB or changes in vision.   PMH: HLD Tobacco use: none Medication: reviewed and updated ROS: see HPI   Objective:  Physical Exam: BP 130/60 (BP Location: Left Arm, Patient Position: Sitting, Cuff Size: Normal)   Pulse 66   Temp 97.8 F (36.6 C) (Oral)   Ht  (1.778 m)   Wt 170 lb 3.2 oz (77.2 kg)   SpO2 99%   BMI 24.42 kg/m   Gen: 56yo M in NAD, resting comfortably CV: RRR with no murmurs appreciated Pulm: NWOB, CTAB with no crackles, wheezes, or rhonchi GI: Normal bowel sounds present. Soft, Nontender, Nondistended. MSK: no edema, cyanosis, or clubbing noted Skin: warm, dry Neuro: grossly normal, moves all extremities Psych: Normal affect and thought content  No results found for this or any previous visit (from the past 72 hour(s)).   Assessment/Plan:  Elevated blood pressure, resolved:  BP WNL today. No symptoms. Does not meet criteria for medication based on JNC 8. Will continue to monitor at future visits.   Health maintenance:  Flu shot today.  Due for colonoscopy. Will place referral.   Rande Brunt. Myrtie Soman, MD [facility] Resident PGY-2 04/20/2017 10:14 AM

## 2017-04-29 DIAGNOSIS — R69 Illness, unspecified: Secondary | ICD-10-CM | POA: Diagnosis not present

## 2017-05-20 ENCOUNTER — Telehealth: Payer: Self-pay

## 2017-05-20 NOTE — Telephone Encounter (Signed)
Patient no showed for pre-visit appointment today. Patient was called to reschedule his PV appointment. Phone message states that Fayrene FearingJames is not accepting our call. I was going to find out if he planned on keeping his colonoscopy as scheduled and try to get him back in for a Pre-Visit. If patient does not call today to reschedule a no show letter will be sent and his colonoscopy will be cancelled.   Janalee DaneNancy Maddyson Keil, LPN ( PV)

## 2017-05-21 ENCOUNTER — Encounter: Payer: Self-pay | Admitting: Family Medicine

## 2017-05-27 DIAGNOSIS — R69 Illness, unspecified: Secondary | ICD-10-CM | POA: Diagnosis not present

## 2017-06-03 ENCOUNTER — Encounter: Payer: Self-pay | Admitting: Gastroenterology

## 2017-06-03 DIAGNOSIS — R69 Illness, unspecified: Secondary | ICD-10-CM | POA: Diagnosis not present

## 2017-06-17 DIAGNOSIS — R69 Illness, unspecified: Secondary | ICD-10-CM | POA: Diagnosis not present

## 2017-07-08 DIAGNOSIS — R69 Illness, unspecified: Secondary | ICD-10-CM | POA: Diagnosis not present

## 2017-07-29 DIAGNOSIS — R69 Illness, unspecified: Secondary | ICD-10-CM | POA: Diagnosis not present

## 2017-08-12 DIAGNOSIS — R69 Illness, unspecified: Secondary | ICD-10-CM | POA: Diagnosis not present

## 2017-08-26 DIAGNOSIS — R69 Illness, unspecified: Secondary | ICD-10-CM | POA: Diagnosis not present

## 2017-09-09 DIAGNOSIS — R69 Illness, unspecified: Secondary | ICD-10-CM | POA: Diagnosis not present

## 2017-09-23 DIAGNOSIS — R69 Illness, unspecified: Secondary | ICD-10-CM | POA: Diagnosis not present

## 2017-10-07 DIAGNOSIS — R69 Illness, unspecified: Secondary | ICD-10-CM | POA: Diagnosis not present

## 2017-10-21 DIAGNOSIS — F209 Schizophrenia, unspecified: Secondary | ICD-10-CM | POA: Diagnosis not present

## 2017-10-30 IMAGING — DX DG HIP (WITH OR WITHOUT PELVIS) 2-3V*R*
3 series · 3 of 3 positions shown · non-contrast
Comparison: None.

CLINICAL DATA: Right hip pain for several days

EXAM:
DG HIP (WITH OR WITHOUT PELVIS) 2-3V RIGHT

[pelvis ap]
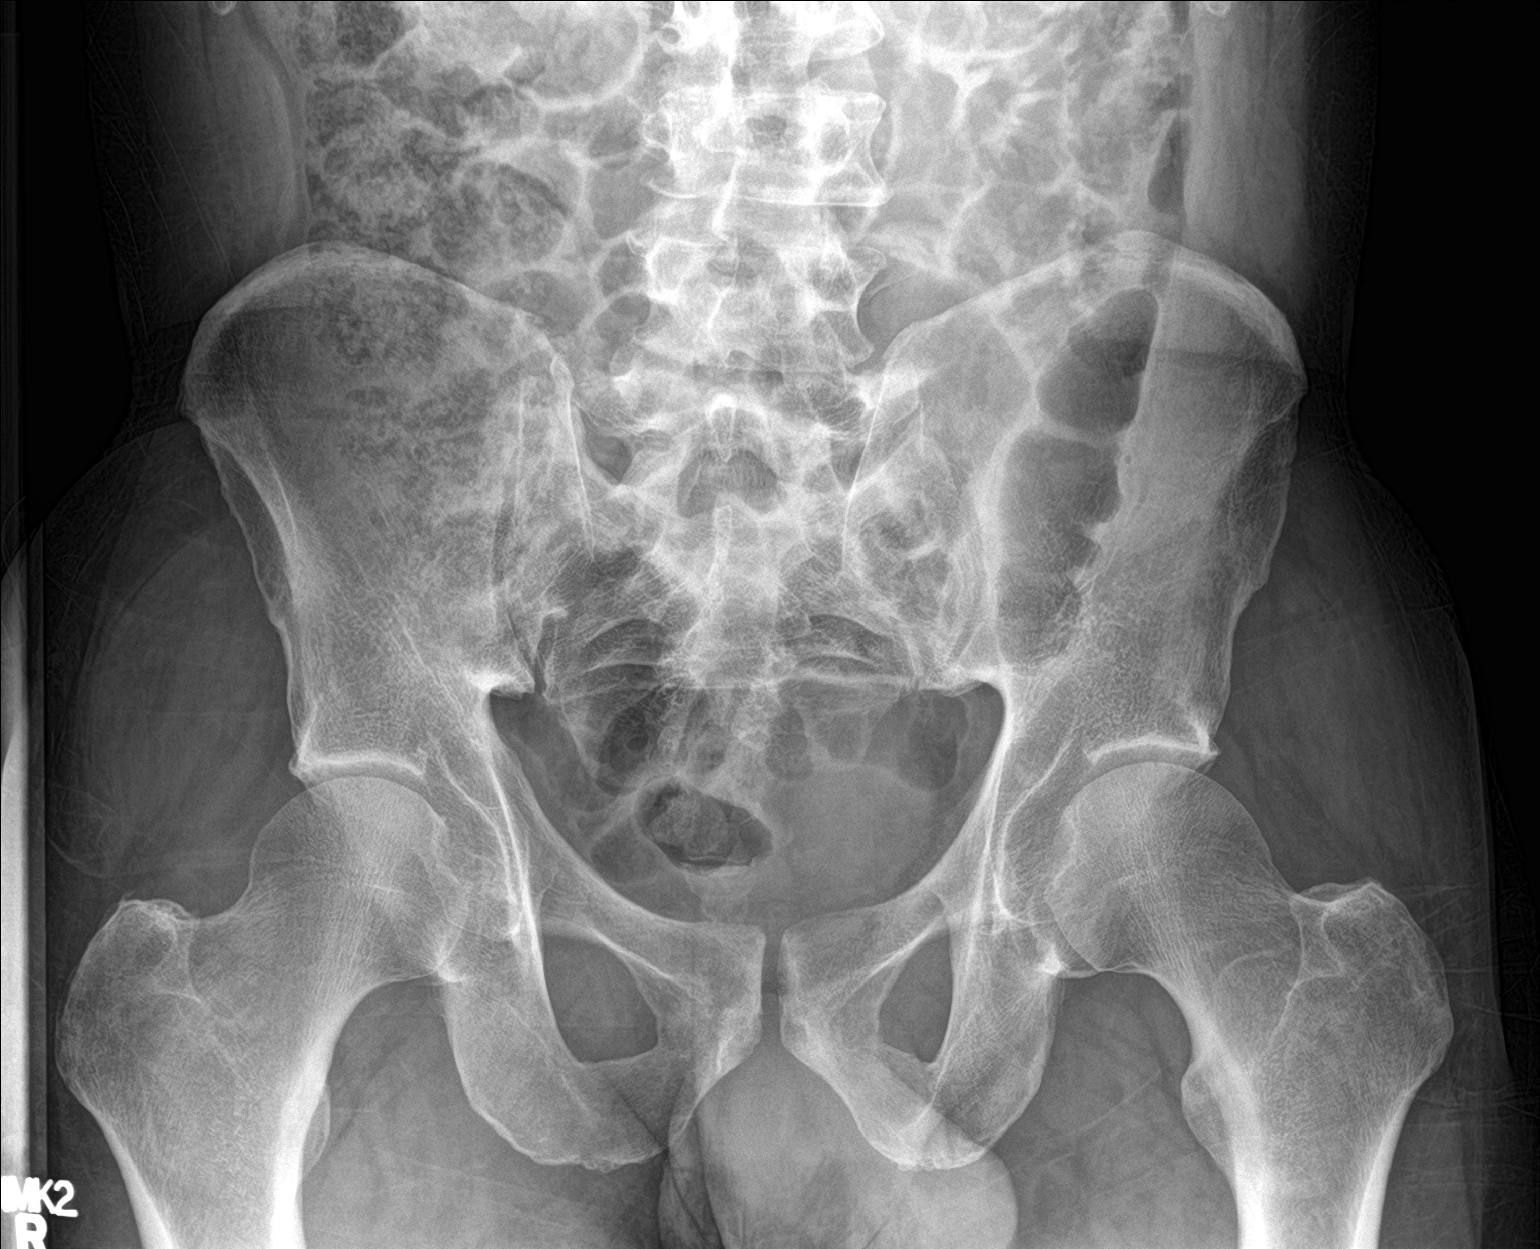

[hip ap]
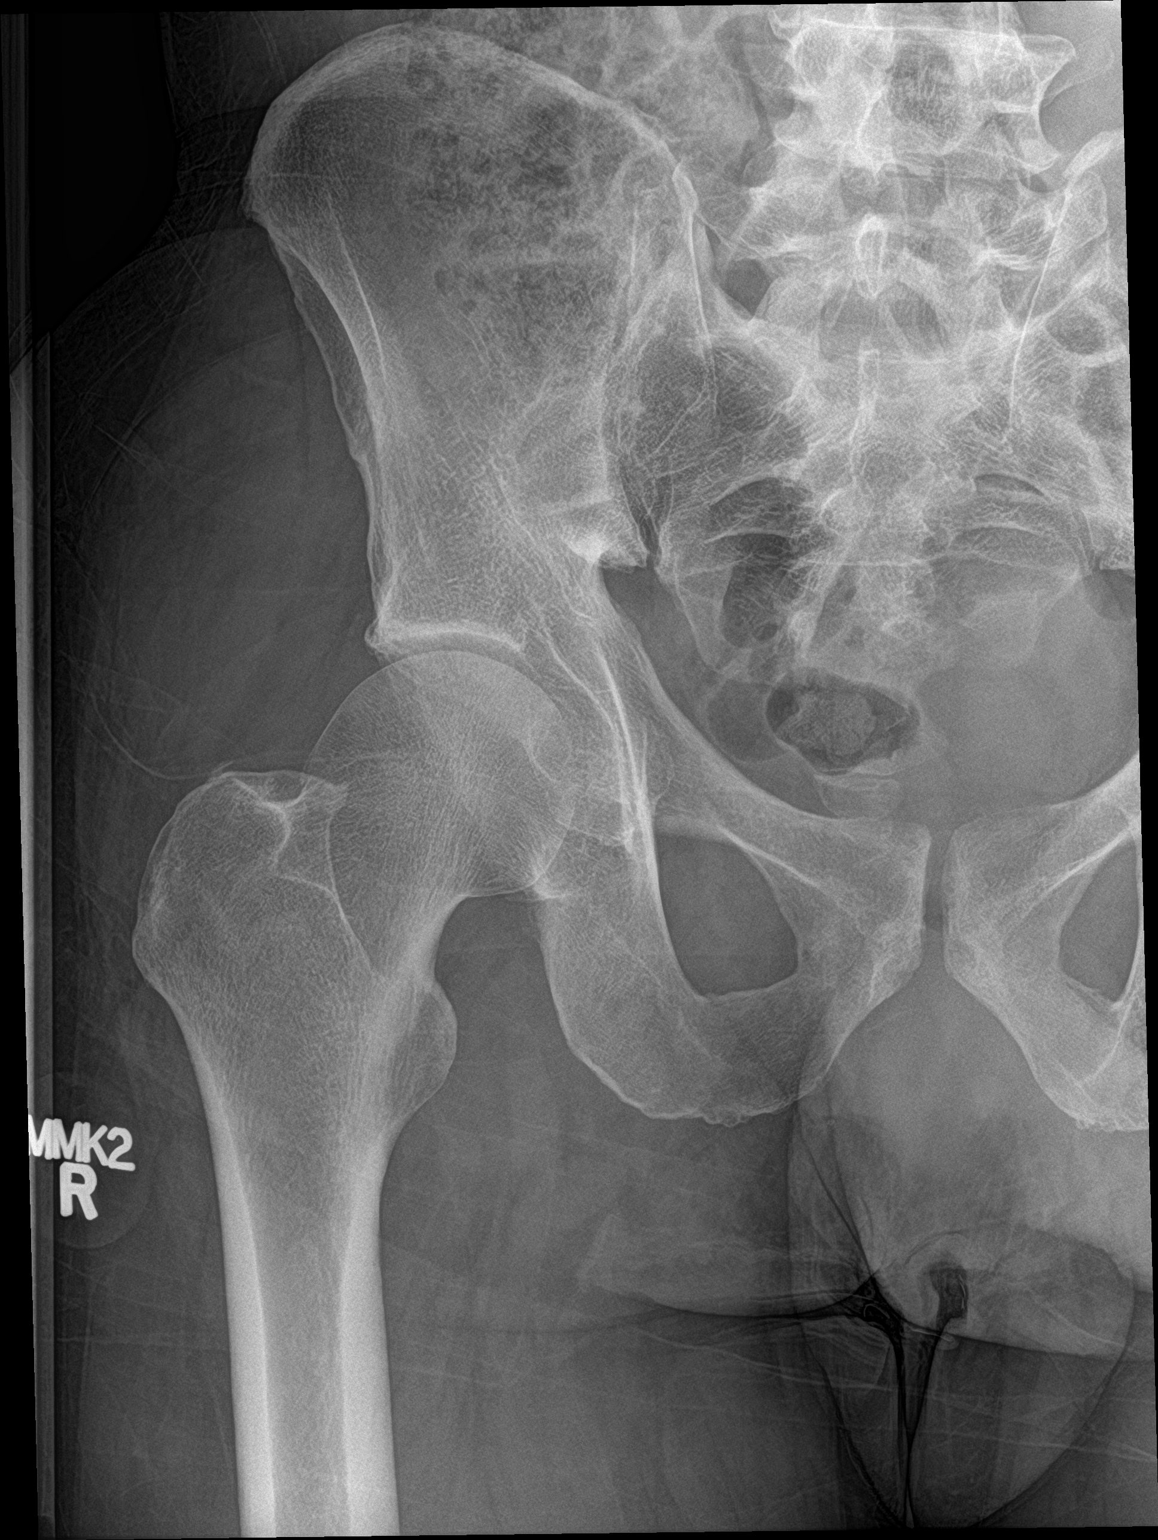

[hip lat]
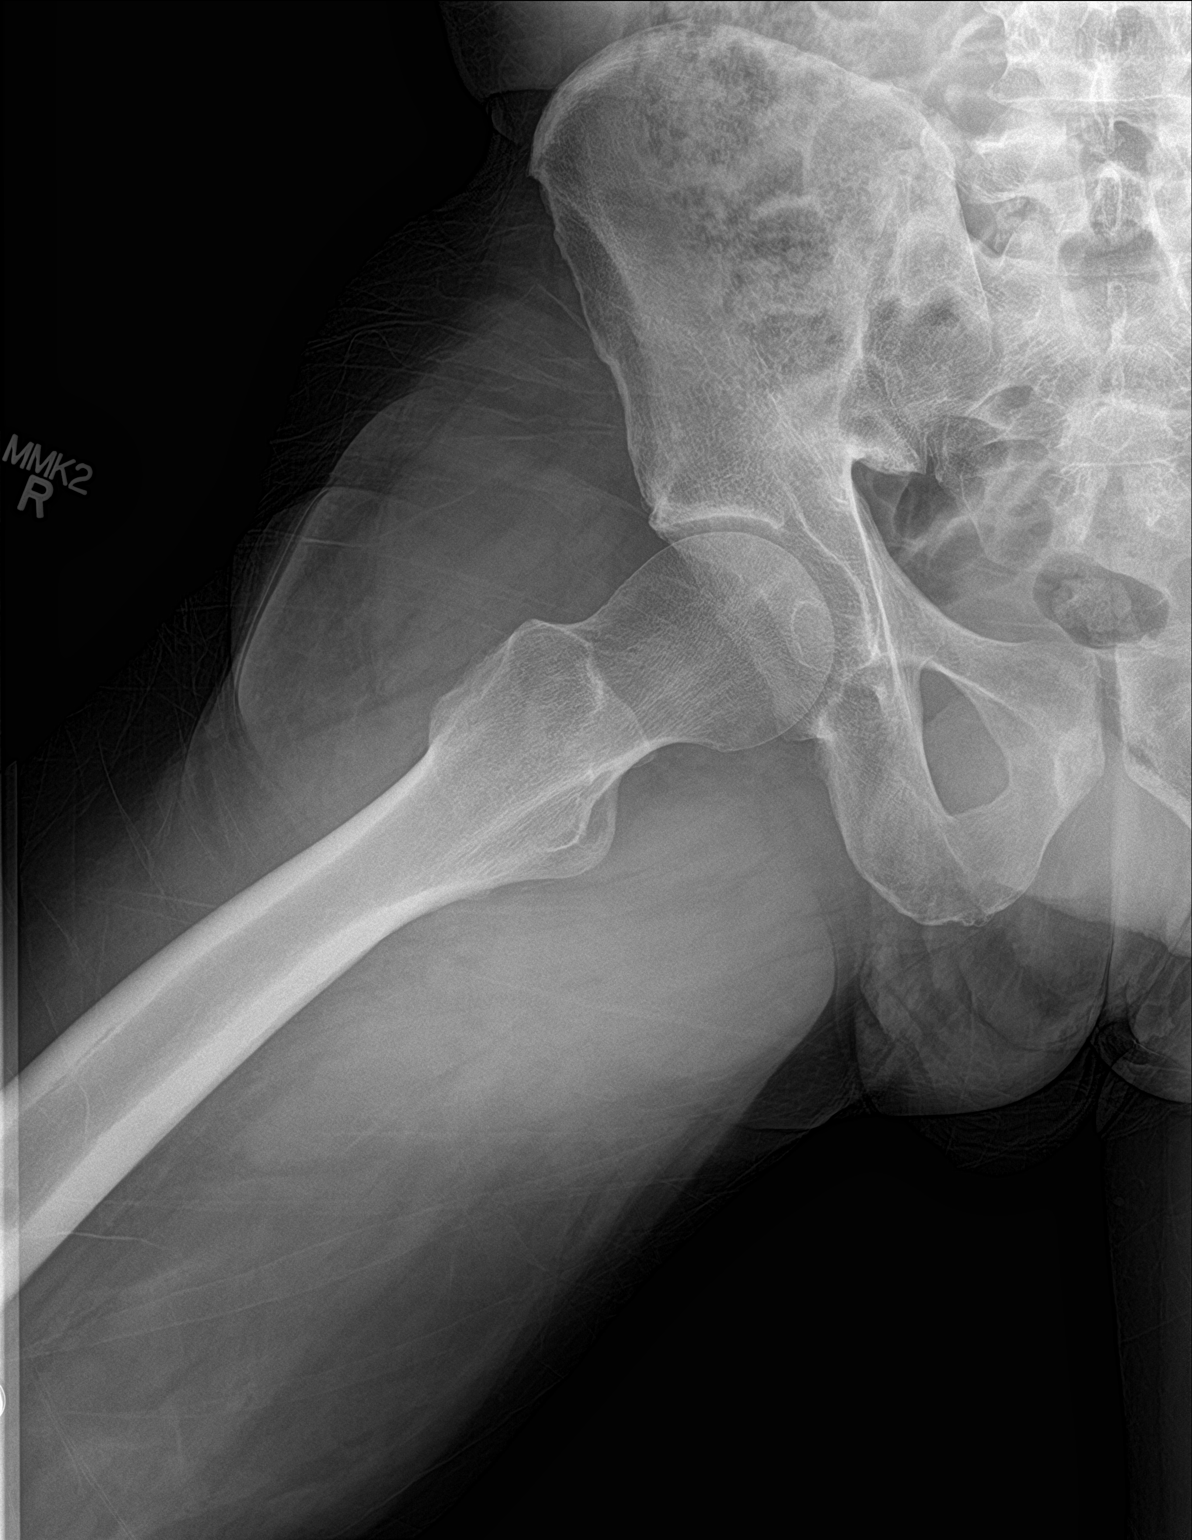

[3 of 3 positions shown; findings below may reference images not displayed]

FINDINGS: There is no evidence of hip fracture or dislocation. There is no
evidence of arthropathy or other focal bone abnormality.
IMPRESSION: Normal right hip radiographs.

## 2017-11-04 DIAGNOSIS — F209 Schizophrenia, unspecified: Secondary | ICD-10-CM | POA: Diagnosis not present

## 2017-11-25 DIAGNOSIS — F209 Schizophrenia, unspecified: Secondary | ICD-10-CM | POA: Diagnosis not present

## 2017-12-09 ENCOUNTER — Ambulatory Visit (INDEPENDENT_AMBULATORY_CARE_PROVIDER_SITE_OTHER): Payer: Self-pay | Admitting: Family Medicine

## 2017-12-09 ENCOUNTER — Encounter: Payer: Self-pay | Admitting: Family Medicine

## 2017-12-09 ENCOUNTER — Other Ambulatory Visit: Payer: Self-pay

## 2017-12-09 VITALS — BP 140/90 | HR 57 | Temp 98.0°F | Ht 70.0 in | Wt 163.0 lb

## 2017-12-09 DIAGNOSIS — I10 Essential (primary) hypertension: Secondary | ICD-10-CM

## 2017-12-09 DIAGNOSIS — E782 Mixed hyperlipidemia: Secondary | ICD-10-CM

## 2017-12-09 DIAGNOSIS — F259 Schizoaffective disorder, unspecified: Secondary | ICD-10-CM

## 2017-12-09 MED ORDER — ATORVASTATIN CALCIUM 40 MG PO TABS: 40.0000 mg | ORAL_TABLET | Freq: Every day | ORAL | 3 refills | Status: DC

## 2017-12-09 MED ORDER — CANDESARTAN CILEXETIL 4 MG PO TABS: 4.0000 mg | ORAL_TABLET | Freq: Every day | ORAL | 2 refills | Status: DC

## 2017-12-09 NOTE — Patient Instructions (Addendum)
It was a pleasure to see you today! Thank you for choosing [facility] for your primary care. Jerry Carter was seen for cholesterol, high blood pressure.   Our plans for today were:  **Make an appt up front with Annice Pih for orange card, a nurse visit and lab visit in 10 days.   Start the new blood pressure medicine. Call me if you have any dizzy feelings.   Come back in 10 days to get your blood work checked and see a nurse visit to recheck your blood pressure and your labs.   I sent your high cholesterol medicine to the pharmacy to restart.   You should return to our clinic to see Dr. Chanetta Marshall in 3 months for blood pressure.   Best,  Dr. Chanetta Marshall

## 2017-12-09 NOTE — Progress Notes (Signed)
   CC: physical  HPI  HLD - never filled his atorvastatin due to cost.   HTN - used to be on medication, can't recall name. No allergies or issues, just didn't refill. BP runs about 140s at [facility], checked there several times. No sxs.   Health maintenance: has never had colonoscopy, wants this.   Some food insecutiry, just got a roommate to help with rent.   Used to smoke cigarettes as a teenager for short period of time, once tried pills as a teen.   Sexual activity - not currently sexually active, hx of activity with both men and women. Identifies as heterosexual, states he is a "born again believer and believes a man should only be with a woman"  Schizoaffective disorder: seeing [facility]. appt today. Prolixin generic. Only med. Has been hospitalized for mood in the past, last time was 2015 not here. Notes EPS symptoms have been present for some time.   Working at [employer], he just got off of disability, working full time. Works at [employer].   ROS: Denies CP, SOB, abdominal pain, dysuria, changes in BMs.   CC, SH/smoking status, and VS noted  Objective: BP 140/90 (BP Location: Right Arm, Patient Position: Sitting, Cuff Size: Large)   Pulse (!) 57   Temp 98 F (36.7 C) (Oral)   Ht '5\' 10"'$  (1.778 m)   Wt 163 lb (73.9 kg)   SpO2 98%   BMI 23.39 kg/m  Gen: NAD, alert, cooperative, and pleasant. Involuntary forehead twitching and grimacing. HEENT: NCAT, EOMI, PERRL CV: RRR, no murmur Resp: CTAB, no wheezes, non-labored Abd: SNTND, BS present, no guarding or organomegaly Ext: No edema, warm Neuro: Alert and oriented, Speech clear, No gross deficits  Assessment and plan:  Hyperlipidemia Recheck lipids today, never got statin. Restart lipitor '40mg'$ .  Hypertension Persistently elevated at [facility] and here. 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 Recheck BMP and BP with RN visit in 10 days.   Schizoaffective disorder Managed by [facility] on prolixin. Noted EPS symptoms  today, patient states these have been present for many years.   Orders Placed This Encounter  Procedures  . Lipid panel  . CMP14+EGFR    Meds ordered this encounter  Medications  . atorvastatin (LIPITOR) 40 MG tablet    Sig: Take 1 tablet (40 mg total) by mouth daily.    Dispense:  90 tablet    Refill:  3  . candesartan (ATACAND) 4 MG tablet    Sig: Take 1 tablet (4 mg total) by mouth daily.    Dispense:  30 tablet    Refill:  2   Health Maintenance reviewed - recommended colonoscopy once he obtained financial assistance.  Social - referred to [facility] for orange card eval, patient with some food insecurity (will also refer to [facility]) and cost limitations to getting his medicines.   Ralene Ok, MD, PGY2 12/10/2017 10:01 AM

## 2017-12-10 ENCOUNTER — Telehealth (HOSPITAL_COMMUNITY): Payer: Self-pay | Admitting: Family Medicine

## 2017-12-10 LAB — CMP14+EGFR
A/G RATIO: 1.4 (ref 1.2–2.2)
ALT: 14 IU/L (ref 0–44)
AST: 15 IU/L (ref 0–40)
Albumin: 3.9 g/dL (ref 3.5–5.5)
Alkaline Phosphatase: 50 IU/L (ref 39–117)
BUN/Creatinine Ratio: 11 (ref 9–20)
BUN: 13 mg/dL (ref 6–24)
Bilirubin Total: 0.3 mg/dL (ref 0.0–1.2)
CALCIUM: 9.7 mg/dL (ref 8.7–10.2)
CO2: 23 mmol/L (ref 20–29)
CREATININE: 1.14 mg/dL (ref 0.76–1.27)
Chloride: 107 mmol/L — ABNORMAL HIGH (ref 96–106)
GFR, EST AFRICAN AMERICAN: 81 mL/min/{1.73_m2} (ref >59)
GFR, EST NON AFRICAN AMERICAN: 70 mL/min/{1.73_m2} (ref >59)
Globulin, Total: 2.8 g/dL (ref 1.5–4.5)
Glucose: 81 mg/dL (ref 65–99)
POTASSIUM: 4.6 mmol/L (ref 3.5–5.2)
Sodium: 142 mmol/L (ref 134–144)
TOTAL PROTEIN: 6.7 g/dL (ref 6.0–8.5)

## 2017-12-10 LAB — LIPID PANEL
CHOL/HDL RATIO: 3.8 ratio (ref 0.0–5.0)
CHOLESTEROL TOTAL: 167 mg/dL (ref 100–199)
HDL: 44 mg/dL (ref >39)
LDL Calculated: 107 mg/dL — ABNORMAL HIGH (ref 0–99)
TRIGLYCERIDES: 80 mg/dL (ref 0–149)
VLDL Cholesterol Cal: 16 mg/dL (ref 5–40)

## 2017-12-10 NOTE — Telephone Encounter (Signed)
Could you please let patient know his labs were normal. He doesn't appear to have an appt with Larena Sox to discuss orange card as I suggested yesterday. If you reach him, could you help him schedule this or ask what happened when he checked out?

## 2017-12-10 NOTE — Assessment & Plan Note (Signed)
Recheck lipids today, never got statin. Restart lipitor .

## 2017-12-10 NOTE — Assessment & Plan Note (Signed)
Persistently elevated at [facility] and here. 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 Recheck BMP and BP with RN visit in 10 days.

## 2017-12-10 NOTE — Assessment & Plan Note (Signed)
Managed by [facility] on prolixin. Noted EPS symptoms today, patient states these have been present for many years.

## 2017-12-10 NOTE — Telephone Encounter (Signed)
Attempted to call.  No answer and VM was full.  Will try again on Tuesday. Oluwatomisin Hustead, Maryjo Rochester, CMA

## 2017-12-14 NOTE — Telephone Encounter (Signed)
Attempted again with same results.   Letter created and routed to Admin to mail. Fleeger, Maryjo Rochester, CMA

## 2017-12-20 ENCOUNTER — Other Ambulatory Visit: Payer: Self-pay

## 2017-12-20 NOTE — Patient Outreach (Signed)
[facility]) Care Management  12/20/2017  Jerry HawthorneJames Carter 04-Nov-1960 [id number]  TELEPHONE SCREENING Referral date: 12/10/17 Referral source: primary MD  Referral reason: food insecurity, medication assistance.  Attempt #1  Telephone call to patient regarding primary MD referral. Unable to reach patient or leave voice mail message.  Message states patients Mailbox is full.   PLAN: RNCM will attempt 2nd telephone call to patient within 4 business days.   [name] RN,BSN,CCM [facility] Telephonic  [phone]

## 2017-12-23 ENCOUNTER — Other Ambulatory Visit: Payer: Self-pay

## 2017-12-23 ENCOUNTER — Ambulatory Visit: Payer: Self-pay

## 2017-12-23 ENCOUNTER — Other Ambulatory Visit: Payer: Self-pay | Admitting: Obstetrics and Gynecology

## 2017-12-23 VITALS — BP 130/94 | HR 72

## 2017-12-23 DIAGNOSIS — I10 Essential (primary) hypertension: Secondary | ICD-10-CM

## 2017-12-23 DIAGNOSIS — I159 Secondary hypertension, unspecified: Secondary | ICD-10-CM

## 2017-12-23 NOTE — Progress Notes (Signed)
Per PCP, BMP placed.

## 2017-12-23 NOTE — Patient Outreach (Signed)
[facility]) Care Management  12/23/2017  Jerry HawthorneJames Carter [birth year] [id number]    TELEPHONE SCREENING Referral date: 12/10/17 Referral source: primary MD  Referral reason: food insecurity, medication assistance.  Attempt #2  Telephone call to patient regarding primary MD referral.  Unable to reach patient.  HIPAA compliant message left with call back phone number.   PLAN: RNCM will attempt  telephone call within 4 business days. RNCM will send outreach letter to attempt contact.   George InaDavina Reagan Behlke RN,BSN,CCM [facility] Telephonic  [phone]

## 2017-12-23 NOTE — Progress Notes (Signed)
   Patient in to nurse clinic for BP check. BP is 130/98, HR 72 on first check and 130/94, HR 72 on second check. Denies HA, chest pain, SOB or dizziness. Patient has not picked up BP medication due to lack of money. He has not made appt with Annice PihJackie.  Spoke with Annice PihJackie and got him the packet he needs to fill out. He understands to fill out the packet and then call her for appt.  Patient taken to lab per PCP note for BMP. Ples SpecterAlisa Liberta Gimpel, RN [facility])

## 2017-12-24 LAB — BASIC METABOLIC PANEL
BUN / CREAT RATIO: 9 (ref 9–20)
BUN: 10 mg/dL (ref 6–24)
CALCIUM: 9.2 mg/dL (ref 8.7–10.2)
CHLORIDE: 107 mmol/L — AB (ref 96–106)
CO2: 24 mmol/L (ref 20–29)
Creatinine, Ser: 1.06 mg/dL (ref 0.76–1.27)
GFR calc non Af Amer: 77 mL/min/{1.73_m2} (ref >59)
GFR, EST AFRICAN AMERICAN: 89 mL/min/{1.73_m2} (ref >59)
Glucose: 66 mg/dL (ref 65–99)
POTASSIUM: 4.6 mmol/L (ref 3.5–5.2)
Sodium: 143 mmol/L (ref 134–144)

## 2017-12-28 ENCOUNTER — Other Ambulatory Visit: Payer: Self-pay

## 2017-12-28 NOTE — Patient Outreach (Signed)
[facility]) Care Management  12/28/2017  Jerry HawthorneJames Carter [birth year] [id number]   TELEPHONE SCREENING Referral date:12/10/17 Referral source:primary MD Referral reason:food insecurity, medication assistance.  Attempt #3  Telephone call to patient regarding primary MD referral.  Unable to reach patient. Unable to leave voice message.  Message states Mailbox is full.   PLAN: If no return call from patient will proceed with closure. George [name].   Mosetta Ferdinand RN,BSN,CCM [facility] Telephonic  [phone]

## 2018-01-03 ENCOUNTER — Other Ambulatory Visit: Payer: Self-pay

## 2018-01-03 ENCOUNTER — Telehealth: Payer: Self-pay | Admitting: *Deleted

## 2018-01-03 NOTE — Patient Outreach (Signed)
[facility]) Care Management  01/03/2018  Jerry HawthorneJames Carter 1961/01/01 [id number]  No response from patient after 3 telephone calls and outreach letter attempt.  PLAN;  RNCM will close patient due to being unable to reach [facility] will send closure notification to patient's primary care provider RNCM contacted patients primary MD office and spoke with Shanda BumpsJessica.  Informed her unable to reach patient.  Requested assistance with engaging patient to [facility].   George InaDavina Ilisha Blust RN,BSN,CCM [facility] Telephonic  [phone]

## 2018-01-03 NOTE — Telephone Encounter (Signed)
Davina wanted to let the provider know that she has attempted to reach patient X 3 and mailed a letter with no response.  Donte Lenzo, Maryjo RochesterJessica Dawn, CMA

## 2018-02-03 ENCOUNTER — Telehealth: Payer: Self-pay | Admitting: Family Medicine

## 2018-02-03 NOTE — Telephone Encounter (Signed)
VM full. Please assist in scheduling HTN f/u when calling back

## 2019-04-16 ENCOUNTER — Ambulatory Visit (HOSPITAL_COMMUNITY)
Admission: EM | Admit: 2019-04-16 | Discharge: 2019-04-16 | Disposition: A | Discharge status: Home / Self Care | Payer: Self-pay | Attending: Emergency Medicine | Admitting: Emergency Medicine

## 2019-04-16 ENCOUNTER — Other Ambulatory Visit: Payer: Self-pay

## 2019-04-16 ENCOUNTER — Encounter (HOSPITAL_COMMUNITY): Payer: Self-pay

## 2019-04-16 DIAGNOSIS — I10 Essential (primary) hypertension: Secondary | ICD-10-CM

## 2019-04-16 MED ORDER — LOSARTAN POTASSIUM 25 MG PO TABS: 25.0000 mg | ORAL_TABLET | Freq: Every day | ORAL | 1 refills | Status: DC

## 2019-04-16 NOTE — ED Provider Notes (Signed)
HPI  SUBJECTIVE:  Jerry Carter is a 58 y.o. male who presents with elevated blood pressure for "a while".  States that it measured 170/108 while at church today.  He does not monitor his blood pressure at home.  Last time he measured it was 1 month ago at [facility].  He ran out of his Candesartan about a year ago.   No headaches, visual changes, facial droop, arm or leg weakness, discoordination, slurred speech, seizures, syncope.  No chest pain, shortness of breath, pain tearing through to his back, abdominal pain, anuria, hematuria, lower extremity edema.  Denies cocaine use, decongestant use.  charts note that he was having difficulty affording this medication.  No aggravating or alleviating factors.  He has not tried anything for this..  He states that he was able to play a full game of basketball earlier today without any problems.  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  He quit smoking 4 to 5 years ago.  PMD: [facility].  Patient is a challenging historian.    Past Medical History:  Diagnosis Date  . Bipolar disorder (HCC)   . Hypertension     History reviewed. No pertinent surgical history.  History reviewed. No pertinent family history.  Social History   Tobacco Use  . Smoking status: Never Smoker  . Smokeless tobacco: Never Used  Substance Use Topics  . Alcohol use: No    Alcohol/week: 0.0 standard drinks  . Drug use: No    No current facility-administered medications for this encounter.   Current Outpatient Medications:  .  atorvastatin (LIPITOR) 40 MG tablet, Take 1 tablet (40 mg total) by mouth daily., Disp: 90 tablet, Rfl: 3 .  fluPHENAZine decanoate (PROLIXIN) 25 MG/ML injection, Inject into the muscle every 14 (fourteen) days., Disp: , Rfl:  .  losartan (COZAAR) 25 MG tablet, Take 1 tablet (25 mg total) by mouth daily., Disp: 30 tablet, Rfl: 1  No Known  Allergies   ROS  As noted in HPI.   Physical Exam  BP (!) 172/108 (BP Location: Right Arm)   Pulse 60   Temp 98.1 F (36.7 C) (Skin)   Resp 18   SpO2 100%    BP Readings from Last 3 Encounters:  04/16/19 (!) 172/108  12/23/17 (!) 130/94  12/09/17 140/90     Constitutional: Well developed, well nourished, no acute distress Eyes: PERRL, EOMI, conjunctiva normal bilaterally HENT: Normocephalic, atraumatic,mucus membranes moist Respiratory: Clear to auscultation bilaterally, no rales, no wheezing, no rhonchi Cardiovascular: Normal rate and rhythm, no murmurs, no gallops, no rubs GI: Soft, nondistended, normal bowel sounds, nontender, no rebound, no guarding Back: no CVAT skin: No rash, skin intact Musculoskeletal: No edema, no tenderness, no deformities Neurologic: Alert & oriented x 3, CN III-XII grossly intact, no motor deficits, sensation grossly intact Psychiatric: Speech and behavior appropriate   ED Course   Medications - No data to display  No orders of the defined types were placed in this encounter.  No results found for this or any previous visit (from the past 24 hour(s)). No results found.  ED Clinical Impression  1. Essential hypertension      ED Assessment/Plan  Outside charts reviewed.  Additional medical history obtained.  Per chart review patient was on candesartan 4 mg daily as of 11/2017   previous notes that the patient was unable to afford the candesartan, and patient does not remember if he could  or not.  We will start him on Cozaar 25 mg daily and have him follow-up with [facility].  1 month supply with 1 refill.  Gave him a good Rx pharmacy coupon for [facility] where it will cost $8.48.  Pt hypertensive today. Has not taken BP meds in a year. Pt has no historical evidence of end organ damage. Discussed importance of lifestyle modifications as important first steps, and the importance of taking usual BP medications. Pt to f/u as  OP.   Discussed  MDM, treatment plan, and plan for follow-up with patient Discussed sn/sx that should prompt return to the ED. patient agrees with plan.   Meds ordered this encounter  Medications  . DISCONTD: losartan (COZAAR) 25 MG tablet    Sig: Take 1 tablet (25 mg total) by mouth daily.    Dispense:  30 tablet    Refill:  1  . losartan (COZAAR) 25 MG tablet    Sig: Take 1 tablet (25 mg total) by mouth daily.    Dispense:  30 tablet    Refill:  1    *This clinic note was created using [employer]. Therefore, there may be occasional mistakes despite careful proofreading.  ?    Melynda Ripple, MD 04/16/19 [phone]

## 2019-04-16 NOTE — ED Triage Notes (Signed)
Pt present elevated blood pressure, symptoms been going on for a while per pt. Pt recently was on medication for elevated blood pressure.

## 2019-04-16 NOTE — Discharge Instructions (Addendum)
I have printed out a coupon for losartan where you can get it at [facility] for $8.48.  Decrease your salt intake. diet and exercise will lower your blood pressure significantly. It is important to keep your blood pressure under good control, as having a elevated blood pressure for prolonged periods of time significantly increases your risk of stroke, heart attacks, kidney damage, eye damage, and other problems. Measure your blood pressure once a day, preferably at the same time every day. Keep a log of this and bring it to your next doctor's appointment.  Bring your blood pressure cuff as well.  Return here in a week for blood pressure recheck if you're unable to find a primary care physician by then. Return immediately to the ER if you start having chest pain, headache, problems seeing, problems talking, problems walking, if you feel like you're about to pass out, if you do pass out, if you have a seizure, or for any other concerns.  Go to www.goodrx.com to look up your medications. This will give you a list of where you can find your prescriptions at the most affordable prices. Or ask the pharmacist what the cash price is, or if they have any other discount programs available to help make your medication more affordable. This can be less expensive than what you would pay with insurance.

## 2020-07-26 ENCOUNTER — Ambulatory Visit (INDEPENDENT_AMBULATORY_CARE_PROVIDER_SITE_OTHER): Payer: PRIVATE HEALTH INSURANCE | Admitting: Family

## 2020-07-26 ENCOUNTER — Other Ambulatory Visit: Payer: Self-pay

## 2020-07-26 ENCOUNTER — Encounter: Payer: Self-pay | Admitting: Family

## 2020-07-26 VITALS — BP 148/90 | HR 59 | Temp 97.7°F | Ht 70.0 in | Wt 173.8 lb

## 2020-07-26 DIAGNOSIS — Z13220 Encounter for screening for lipoid disorders: Secondary | ICD-10-CM

## 2020-07-26 DIAGNOSIS — Z1211 Encounter for screening for malignant neoplasm of colon: Secondary | ICD-10-CM | POA: Diagnosis not present

## 2020-07-26 DIAGNOSIS — E785 Hyperlipidemia, unspecified: Secondary | ICD-10-CM

## 2020-07-26 DIAGNOSIS — Z0000 Encounter for general adult medical examination without abnormal findings: Secondary | ICD-10-CM | POA: Diagnosis not present

## 2020-07-26 DIAGNOSIS — Z125 Encounter for screening for malignant neoplasm of prostate: Secondary | ICD-10-CM

## 2020-07-26 DIAGNOSIS — F259 Schizoaffective disorder, unspecified: Secondary | ICD-10-CM

## 2020-07-26 DIAGNOSIS — I159 Secondary hypertension, unspecified: Secondary | ICD-10-CM

## 2020-07-26 LAB — TSH: TSH: 1.29 u[IU]/mL (ref 0.35–4.50)

## 2020-07-26 LAB — LIPID PANEL
Cholesterol: 166 mg/dL (ref 0–200)
HDL: 46.7 mg/dL (ref >39.00)
LDL Cholesterol: 109 mg/dL — ABNORMAL HIGH (ref 0–99)
NonHDL: 119.51
Total CHOL/HDL Ratio: 4
Triglycerides: 51 mg/dL (ref 0.0–149.0)
VLDL: 10.2 mg/dL (ref 0.0–40.0)

## 2020-07-26 LAB — CBC WITH DIFFERENTIAL/PLATELET
Basophils Absolute: 0 10*3/uL (ref 0.0–0.1)
Basophils Relative: 0.5 % (ref 0.0–3.0)
Eosinophils Absolute: 0.1 10*3/uL (ref 0.0–0.7)
Eosinophils Relative: 1.7 % (ref 0.0–5.0)
HCT: 43.2 % (ref 39.0–52.0)
Hemoglobin: 14.2 g/dL (ref 13.0–17.0)
Lymphocytes Relative: 34.9 % (ref 12.0–46.0)
Lymphs Abs: 1 10*3/uL (ref 0.7–4.0)
MCHC: 32.8 g/dL (ref 30.0–36.0)
MCV: 82.8 fl (ref 78.0–100.0)
Monocytes Absolute: 0.3 10*3/uL (ref 0.1–1.0)
Monocytes Relative: 9.8 % (ref 3.0–12.0)
Neutro Abs: 1.6 10*3/uL (ref 1.4–7.7)
Neutrophils Relative %: 53.1 % (ref 43.0–77.0)
Platelets: 175 10*3/uL (ref 150.0–400.0)
RBC: 5.21 Mil/uL (ref 4.22–5.81)
RDW: 14.6 % (ref 11.5–15.5)
WBC: 2.9 10*3/uL — ABNORMAL LOW (ref 4.0–10.5)

## 2020-07-26 LAB — COMPREHENSIVE METABOLIC PANEL
ALT: 26 U/L (ref 0–53)
AST: 28 U/L (ref 0–37)
Albumin: 4.5 g/dL (ref 3.5–5.2)
Alkaline Phosphatase: 63 U/L (ref 39–117)
BUN: 13 mg/dL (ref 6–23)
CO2: 30 mEq/L (ref 19–32)
Calcium: 9.2 mg/dL (ref 8.4–10.5)
Chloride: 106 mEq/L (ref 96–112)
Creatinine, Ser: 1.05 mg/dL (ref 0.40–1.50)
GFR: 76.67 mL/min (ref >60.00)
Glucose, Bld: 94 mg/dL (ref 70–99)
Potassium: 3.6 mEq/L (ref 3.5–5.1)
Sodium: 142 mEq/L (ref 135–145)
Total Bilirubin: 0.7 mg/dL (ref 0.2–1.2)
Total Protein: 7.5 g/dL (ref 6.0–8.3)

## 2020-07-26 LAB — PSA: PSA: 0.58 ng/mL (ref 0.10–4.00)

## 2020-07-26 NOTE — Progress Notes (Signed)
Jerry Carter is a 60 y.o. male with the following history as recorded in EpicCare:  Patient Active Problem List   Diagnosis Date Noted  . Hyperlipidemia 03/06/2017  . Hypertension   . Schizoaffective disorder ([place]) 02/16/2014    Current Outpatient Medications  Medication Sig Dispense Refill  . fluPHENAZine decanoate (PROLIXIN) 25 MG/ML injection Inject into the muscle every 14 (fourteen) days.    Marland Kitchen atorvastatin (LIPITOR) 40 MG tablet Take 1 tablet (40 mg total) by mouth daily. (Patient not taking: Reported on 07/26/2020) 90 tablet 3  . losartan (COZAAR) 25 MG tablet Take 1 tablet (25 mg total) by mouth daily. (Patient not taking: Reported on 07/26/2020) 30 tablet 1   No current facility-administered medications for this visit.    Allergies: Patient has no known allergies.  Past Medical History:  Diagnosis Date  . Bipolar disorder ([place])   . Hypertension     No past surgical history on file.  No family history on file.  Social History   Tobacco Use  . Smoking status: Never Smoker  . Smokeless tobacco: Never Used  Substance Use Topics  . Alcohol use: No    Alcohol/week: 0.0 standard drinks    Subjective:  Presents today as a new patient; has a history of hypertension and hyperlipidemia; has not been on his medication for at least the past year; he notes he does not want to get back on either the Lipitor or Losartan he has taken in the past; he wants "follow a healthy diet" first;  He wants to get his labs updated today; he does not want a flu shot;  He is under the care of [facility] for his Prolixin medication- unfortunately, he has not been able to get regularly for the past 2 months due to financial concerns;     Objective:  Vitals:   07/26/20 0927  BP: (!) 148/90  Pulse: (!) 59  Temp: 97.7 F (36.5 C)  TempSrc: Oral  SpO2: 99%  Weight: 173 lb 12.8 oz (78.8 kg)  Height: $Remove'5\' 10"'tkRfMjT$  (1.778 m)    General: Well developed, well nourished, in no acute distress  Skin : Warm and  dry.  Head: Normocephalic and atraumatic  Eyes: Sclera and conjunctiva clear; pupils round and reactive to light; extraocular movements intact  Ears: External normal; canals clear; tympanic membranes normal  Oropharynx: Pink, supple. No suspicious lesions  Neck: Supple without thyromegaly, adenopathy  Lungs: Respirations unlabored; clear to auscultation bilaterally without wheeze, rales, rhonchi  CVS exam: normal rate and regular rhythm.  Abdomen: Soft; nontender; nondistended; normoactive bowel sounds; no masses or hepatosplenomegaly  Musculoskeletal: No deformities; no active joint inflammation  Extremities: No edema, cyanosis, clubbing  Vessels: Symmetric bilaterally  Neurologic: Alert and oriented; speech intact; face symmetrical; moves all extremities well; CNII-XII intact without focal deficit   Assessment:  1. PE (physical exam), annual   2. Lipid screening   3. Prostate cancer screening   4. Screening for colon cancer   5. Secondary hypertension   6. Hyperlipidemia, unspecified hyperlipidemia type   7. Schizoaffective disorder, unspecified type [facility])     Plan:   Age appropriate preventive healthcare needs addressed; encouraged regular eye doctor and dental exams; encouraged regular exercise; will update labs as needed today; follow-up to be determined;  Patient defers re-starting his blood pressure medication or cholesterol medication; he is not currently under the care of his psychiatrist but does note he plans to re-start in the next few months; he notes he has goals for  2022 to be a positive year for his health and finances; he defers scheduling a follow- up for 3-4 months to re-check his blood pressure here; he notes he will try to start checking on his own and agrees to follow-up if consistently above 140/90; he is also encouraged to try OTC Debrox for his ears;   No follow-ups on file.  Orders Placed This Encounter  Procedures  . CBC with Differential/Platelet     Standing Status:   Future    Standing Expiration Date:   07/26/2021  . Comp Met (CMET)    Standing Status:   Future    Standing Expiration Date:   07/26/2021  . Lipid panel    Standing Status:   Future    Standing Expiration Date:   07/26/2021  . TSH    Standing Status:   Future    Standing Expiration Date:   07/26/2021  . PSA    Standing Status:   Future    Standing Expiration Date:   07/26/2021  . Ambulatory referral to Gastroenterology    Referral Priority:   Routine    Referral Type:   Consultation    Referral Reason:   Specialty Services Required    Number of Visits Requested:   1    Requested Prescriptions    No prescriptions requested or ordered in this encounter

## 2020-08-16 ENCOUNTER — Other Ambulatory Visit: Payer: Self-pay | Admitting: Family

## 2020-08-16 MED ORDER — ATORVASTATIN CALCIUM 40 MG PO TABS: 40.0000 mg | ORAL_TABLET | Freq: Every day | ORAL | 0 refills | Status: DC

## 2020-08-16 MED ORDER — LOSARTAN POTASSIUM 25 MG PO TABS: 25.0000 mg | ORAL_TABLET | Freq: Every day | ORAL | 0 refills | Status: DC

## 2020-10-24 ENCOUNTER — Telehealth: Payer: Self-pay | Admitting: Family

## 2020-10-24 NOTE — Telephone Encounter (Signed)
Best contact # [phone]

## 2020-10-24 NOTE — Telephone Encounter (Signed)
Patient is interested in donating his kidney to his friend. He is requesting his blood type and other information that he might need.   Please return his call

## 2020-10-25 NOTE — Telephone Encounter (Signed)
I have called the pt back and informed him that we are unable to do any test for a transplant surgery. He should contact his friend that he plans on donating the kidney and he will have the information to what to do next and where to go to ensure that he is a Microbiologist. A simple blood type test is not enough. Pt reported understanding and will reach out to his friend.

## 2020-11-15 ENCOUNTER — Other Ambulatory Visit: Payer: Self-pay | Admitting: Family

## 2021-02-08 ENCOUNTER — Other Ambulatory Visit: Payer: Self-pay

## 2021-02-08 ENCOUNTER — Ambulatory Visit (HOSPITAL_COMMUNITY)
Admission: EM | Admit: 2021-02-08 | Discharge: 2021-02-08 | Disposition: A | Discharge status: Home / Self Care | Payer: PRIVATE HEALTH INSURANCE | Attending: Emergency Medicine | Admitting: Emergency Medicine

## 2021-02-08 ENCOUNTER — Encounter (HOSPITAL_COMMUNITY): Payer: Self-pay

## 2021-02-08 DIAGNOSIS — T148XXA Other injury of unspecified body region, initial encounter: Secondary | ICD-10-CM

## 2021-02-08 DIAGNOSIS — S91332A Puncture wound without foreign body, left foot, initial encounter: Secondary | ICD-10-CM | POA: Diagnosis not present

## 2021-02-08 DIAGNOSIS — S99922A Unspecified injury of left foot, initial encounter: Secondary | ICD-10-CM | POA: Diagnosis not present

## 2021-02-08 MED ORDER — TETANUS-DIPHTH-ACELL PERTUSSIS 5-2.5-18.5 LF-MCG/0.5 IM SUSY
0.5000 mL | PREFILLED_SYRINGE | Freq: Once | INTRAMUSCULAR | Status: AC
  Administered 2021-02-08: 0.5 mL via INTRAMUSCULAR

## 2021-02-08 MED ORDER — TETANUS-DIPHTH-ACELL PERTUSSIS 5-2.5-18.5 LF-MCG/0.5 IM SUSY
PREFILLED_SYRINGE | INTRAMUSCULAR | Status: AC
  Filled 2021-02-08: qty 0.5

## 2021-02-08 NOTE — ED Triage Notes (Signed)
Pt present left foot puncture wound from a nail. Pt states that he is not up to date on his tetanus shot. Pt states when he apply pressure to his foot he can feel a numb pain.

## 2021-02-08 NOTE — Discharge Instructions (Addendum)
Wash your foot where you stepped on the nail twice a day with soapy water.  You can apply antibiotic ointment as well.   You can apply ice to help with pain and swelling.    If you notice any worsening swelling, redness, drainage, or red streaks, come back for recheck.    Return or go to the Emergency Department if symptoms worsen or do not improve in the next few days.

## 2021-02-08 NOTE — ED Provider Notes (Signed)
[facility]    CSN: [id number] Arrival date & time: 02/08/21  1733      History   Chief Complaint Chief Complaint  Patient presents with   Puncture Wound    HPI Salam Micucci is a 60 y.o. male.   Patient here for evaluation of left foot wound after stepping on a nail earlier today.  Reports nail was long and went through shoe.  Bleeding controlled.  Unsure of last tetanus shot.  Denies any specific alleviating or aggravating factors.  Denies any fevers, chest pain, shortness of breath, N/V/D, numbness, tingling, weakness, abdominal pain, or headaches.     The history is provided by the patient.   Past Medical History:  Diagnosis Date   Bipolar disorder [facility])    Hypertension     Patient Active Problem List   Diagnosis Date Noted   Hyperlipidemia 03/06/2017   Hypertension    Schizoaffective disorder (HCC) 02/16/2014    History reviewed. No pertinent surgical history.     Home Medications    Prior to Admission medications   Medication Sig Start Date End Date Taking? Authorizing Provider  atorvastatin (LIPITOR) 40 MG tablet TAKE 1 TABLET(40 MG) BY MOUTH DAILY 11/15/20   Olive Bass, FNP  fluPHENAZine decanoate (PROLIXIN) 25 MG/ML injection Inject into the muscle every 14 (fourteen) days.    [provider]  losartan (COZAAR) 25 MG tablet Take 1 tablet (25 mg total) by mouth daily. 08/16/20   Olive Bass, FNP  candesartan (ATACAND) 4 MG tablet Take 1 tablet (4 mg total) by mouth daily. 12/09/17 04/16/19  Shon Hale, MD    Family History History reviewed. No pertinent family history.  Social History Social History   Tobacco Use   Smoking status: Never   Smokeless tobacco: Never  Substance Use Topics   Alcohol use: No    Alcohol/week: 0.0 standard drinks   Drug use: No     Allergies   Patient has no known allergies.   Review of Systems Review of Systems  Skin:  Positive for wound.  All other systems  reviewed and are negative.   Physical Exam Triage Vital Signs ED Triage Vitals  Enc Vitals Group     BP 02/08/21 1803 (!) 165/115     Pulse Rate 02/08/21 1803 85     Resp 02/08/21 1803 18     Temp 02/08/21 1803 98 F (36.7 C)     Temp Source 02/08/21 1803 Oral     SpO2 02/08/21 1803 99 %     Weight --      Height --      Head Circumference --      Peak Flow --      Pain Score 02/08/21 1804 2     Pain Loc --      Pain Edu? --      Excl. in GC? --    No data found.  Updated Vital Signs BP (!) 165/115 (BP Location: Left Arm)   Pulse 85   Temp 98 F (36.7 C) (Oral)   Resp 18   SpO2 99%   Visual Acuity Right Eye Distance:   Left Eye Distance:   Bilateral Distance:    Right Eye Near:   Left Eye Near:    Bilateral Near:     Physical Exam Vitals and nursing note reviewed.  Constitutional:      General: He is not in acute distress.    Appearance: Normal appearance. He is not  ill-appearing, toxic-appearing or diaphoretic.  HENT:     Head: Normocephalic and atraumatic.  Eyes:     Conjunctiva/sclera: Conjunctivae normal.  Cardiovascular:     Rate and Rhythm: Normal rate.     Pulses: Normal pulses.  Pulmonary:     Effort: Pulmonary effort is normal.  Abdominal:     General: Abdomen is flat.  Musculoskeletal:        General: Normal range of motion.     Cervical back: Normal range of motion.  Skin:    General: Skin is warm and dry.     Findings: Bruising (center of left arch with mild bruising and swelling) and wound present.  Neurological:     General: No focal deficit present.     Mental Status: He is alert and oriented to person, place, and time.  Psychiatric:        Mood and Affect: Mood normal.     UC Treatments / Results  Labs (all labs ordered are listed, but only abnormal results are displayed) Labs Reviewed - No data to display  EKG   Radiology No results found.  Procedures Procedures (including critical care time)  Medications Ordered  in UC Medications  Tdap (BOOSTRIX) injection 0.5 mL (0.5 mLs Intramuscular Given 02/08/21 1825)    Initial Impression / Assessment and Plan / UC Course  I have reviewed the triage vital signs and the nursing notes.  Pertinent labs & imaging results that were available during my care of the patient were reviewed by me and considered in my medical decision making (see chart for details).    Assessment negative for red flags or concerns.  Puncture wound to left foot.  Tetanus updated in office.  Clean wound with soapy water and apply antibiotic ointment as needed.  Follow up for any signs of infection.  Final Clinical Impressions(s) / UC Diagnoses   Final diagnoses:  Puncture wound  Injury of left foot, initial encounter     Discharge Instructions      Wash your foot where you stepped on the nail twice a day with soapy water.  You can apply antibiotic ointment as well.   You can apply ice to help with pain and swelling.    If you notice any worsening swelling, redness, drainage, or red streaks, come back for recheck.    Return or go to the Emergency Department if symptoms worsen or do not improve in the next few days.      ED Prescriptions   None    PDMP not reviewed this encounter.   Ivette Loyal, NP 02/08/21 [phone]

## 2021-04-28 ENCOUNTER — Other Ambulatory Visit: Payer: Self-pay | Admitting: Family

## 2021-04-28 NOTE — Telephone Encounter (Signed)
Pt called to see what he wanted to do with follow up care. No answer so I left a message to call back.

## 2022-01-19 ENCOUNTER — Emergency Department (HOSPITAL_COMMUNITY)
Admission: EM | Admit: 2022-01-19 | Discharge: 2022-01-19 | Disposition: A | Discharge status: Home / Self Care | Payer: PRIVATE HEALTH INSURANCE | Attending: Emergency Medicine | Admitting: Emergency Medicine

## 2022-01-19 ENCOUNTER — Other Ambulatory Visit: Payer: Self-pay

## 2022-01-19 ENCOUNTER — Encounter (HOSPITAL_COMMUNITY): Payer: Self-pay

## 2022-01-19 DIAGNOSIS — I10 Essential (primary) hypertension: Secondary | ICD-10-CM | POA: Insufficient documentation

## 2022-01-19 DIAGNOSIS — Z79899 Other long term (current) drug therapy: Secondary | ICD-10-CM | POA: Insufficient documentation

## 2022-01-19 DIAGNOSIS — Z760 Encounter for issue of repeat prescription: Secondary | ICD-10-CM

## 2022-01-19 MED ORDER — FLUPHENAZINE DECANOATE 25 MG/ML IJ SOLN
12.5000 mg | Freq: Once | INTRAMUSCULAR | Status: AC
  Administered 2022-01-19: 12.5 mg via INTRAMUSCULAR
  Filled 2022-01-19: qty 0.5

## 2022-01-19 MED ORDER — ATORVASTATIN CALCIUM 40 MG PO TABS
40.0000 mg | ORAL_TABLET | Freq: Once | ORAL | Status: AC
Start: 2022-01-19 — End: 2022-01-19
  Administered 2022-01-19: 40 mg via ORAL
  Filled 2022-01-19: qty 1

## 2022-01-19 MED ORDER — LOSARTAN POTASSIUM 25 MG PO TABS: 25.0000 mg | ORAL_TABLET | Freq: Every day | ORAL | 0 refills | Status: DC

## 2022-01-19 MED ORDER — ATORVASTATIN CALCIUM 40 MG PO TABS: ORAL_TABLET | ORAL | 0 refills | Status: DC

## 2022-01-19 MED ORDER — LOSARTAN POTASSIUM 25 MG PO TABS
25.0000 mg | ORAL_TABLET | Freq: Once | ORAL | Status: AC
Start: 2022-01-19 — End: 2022-01-19
  Administered 2022-01-19: 25 mg via ORAL
  Filled 2022-01-19: qty 1

## 2022-01-19 NOTE — ED Triage Notes (Signed)
Patient states that he needs a "shot for schizophrenia." Patient states that he is suppose to receive it every 2 weeks. Patient states he normally goes to [facility] for this medication. Patient states that he has been having financial problems.  Patient denies any SI/HI.

## 2022-01-19 NOTE — ED Provider Notes (Signed)
[facility] Provider Note   CSN: [id number] Arrival date & time: 01/19/22  0736     History  Chief Complaint  Patient presents with   Illness    Jerry Carter is a 61 y.o. male.  Patient here for medication refill.  He has history of high blood pressure, high cholesterol, schizophrenia.  He is on Prolixin shots that he gets at [employer].  He has been able to get his shot the last time around due to not being able to pay his latest medical bills.  He does have insurance.  But he has to pay his out-of-pocket expenses first.  Denies any suicidal homicidal ideation.  Overall he is doing well.  Denies any chest pain, shortness of breath, fever, chills.  The history is provided by the patient.       Home Medications Prior to Admission medications   Medication Sig Start Date End Date Taking? Authorizing Provider  atorvastatin (LIPITOR) 40 MG tablet TAKE 1 TABLET(40 MG) BY MOUTH DAILY 01/19/22   Ania Levay, DO  fluPHENAZine decanoate (PROLIXIN) 25 MG/ML injection Inject into the muscle every 14 (fourteen) days.    [provider]  losartan (COZAAR) 25 MG tablet Take 1 tablet (25 mg total) by mouth daily. 01/19/22 02/18/22  Tyson Masin, DO  candesartan (ATACAND) 4 MG tablet Take 1 tablet (4 mg total) by mouth daily. 12/09/17 04/16/19  Shon Hale, MD      Allergies    Patient has no known allergies.    Review of Systems   Review of Systems  Physical Exam Updated Vital Signs BP (!) 195/104 (BP Location: Left Arm)   Pulse (!) 58   Temp (!) 97.5 F (36.4 C) (Oral)   Resp 20   Ht 5\' 10"  (1.778 m)   Wt 77.1 kg   SpO2 99%   BMI 24.39 kg/m  Physical Exam Vitals and nursing note reviewed.  Constitutional:      General: He is not in acute distress.    Appearance: He is well-developed.  HENT:     Head: Normocephalic and atraumatic.  Eyes:     Conjunctiva/sclera: Conjunctivae normal.  Cardiovascular:     Rate and Rhythm: Normal  rate and regular rhythm.     Heart sounds: No murmur heard. Pulmonary:     Effort: Pulmonary effort is normal. No respiratory distress.     Breath sounds: Normal breath sounds.  Abdominal:     Palpations: Abdomen is soft.     Tenderness: There is no abdominal tenderness.  Musculoskeletal:        General: No swelling.     Cervical back: Neck supple.  Skin:    General: Skin is warm and dry.     Capillary Refill: Capillary refill takes less than 2 seconds.  Neurological:     Mental Status: He is alert.  Psychiatric:        Mood and Affect: Mood normal.        Behavior: Behavior normal.        Thought Content: Thought content normal.        Judgment: Judgment normal.     ED Results / Procedures / Treatments   Labs (all labs ordered are listed, but only abnormal results are displayed) Labs Reviewed - No data to display  EKG None  Radiology No results found.  Procedures Procedures    Medications Ordered in ED Medications  atorvastatin (LIPITOR) tablet 40 mg (has no administration in time range)  losartan (COZAAR) tablet 25 mg (has no administration in time range)  fluPHENAZine decanoate (PROLIXIN) injection 12.5 mg (has no administration in time range)    ED Course/ Medical Decision Making/ A&P                           Medical Decision Making Risk Prescription drug management.   Jerry Carter is here for medication refill.  History of high cholesterol, hypertension, schizophrenia.  Has not been able to take his medications due to cost the last several weeks.  Was going to [facility] for his Prolixin shot.  We will give him a shot today if available.  We will give him his dose of cholesterol and blood pressure medication including refill.  We will refer him to behavioral health urgent care for further assistance in management of his psych meds.  May need new regimen.  Overall he is stable.  He has no psychiatric or medical emergencies at this time.  Discharge.  This  chart was dictated using voice recognition software.  Despite best efforts to proofread,  errors can occur which can change the documentation meaning.         Final Clinical Impression(s) / ED Diagnoses Final diagnoses:  Medication refill    Rx / DC Orders ED Discharge Orders          Ordered    atorvastatin (LIPITOR) 40 MG tablet        01/19/22 0759    losartan (COZAAR) 25 MG tablet  Daily        01/19/22 0759              Virgina Norfolk, DO 01/19/22 0800

## 2022-01-30 ENCOUNTER — Ambulatory Visit (HOSPITAL_COMMUNITY): Payer: Self-pay | Admitting: Psychiatry

## 2022-02-06 ENCOUNTER — Ambulatory Visit (HOSPITAL_COMMUNITY): Payer: No Payment, Other | Admitting: *Deleted

## 2022-02-06 ENCOUNTER — Ambulatory Visit (INDEPENDENT_AMBULATORY_CARE_PROVIDER_SITE_OTHER): Payer: No Payment, Other | Admitting: Student in an Organized Health Care Education/Training Program

## 2022-02-06 ENCOUNTER — Encounter (HOSPITAL_COMMUNITY): Payer: Self-pay | Admitting: Student in an Organized Health Care Education/Training Program

## 2022-02-06 VITALS — BP 124/99 | HR 53 | Wt 161.0 lb

## 2022-02-06 DIAGNOSIS — F250 Schizoaffective disorder, bipolar type: Secondary | ICD-10-CM | POA: Diagnosis not present

## 2022-02-06 DIAGNOSIS — G2401 Drug induced subacute dyskinesia: Secondary | ICD-10-CM

## 2022-02-06 DIAGNOSIS — T43505A Adverse effect of unspecified antipsychotics and neuroleptics, initial encounter: Secondary | ICD-10-CM | POA: Diagnosis not present

## 2022-02-06 MED ORDER — VALBENAZINE TOSYLATE 40 MG PO CAPS: 40.0000 mg | ORAL_CAPSULE | Freq: Every day | ORAL | 1 refills | Status: DC

## 2022-02-06 MED ORDER — FLUPHENAZINE DECANOATE 25 MG/ML IJ SOLN: 25.0000 mg | INTRAMUSCULAR | 24 refills | Status: DC

## 2022-02-06 MED ORDER — FLUPHENAZINE DECANOATE 25 MG/ML IJ SOLN
25.0000 mg | INTRAMUSCULAR | Status: AC
  Administered 2022-02-06 – 2022-04-30 (×4): 25 mg via INTRAMUSCULAR
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
## 2022-02-06 NOTE — Progress Notes (Signed)
In as a new patient appt with Dr Morrie Sheldon. She determined he needed a Prolixin D shot today. Pharmacy downstairs supplied the dose for today but going forward will need to pick up his rx. He was told this and expressed his understanding. He has insurance but owes [employer] so they told him he couldn't come back till his balance was paid which he says is 1000. He is hyperverbal but pleasant and a bit disorganized. Today he got prolixin D 25 mg in his R GLUTEAL muscle without issue. He is to get his next shot in 14 days.

## 2022-02-10 ENCOUNTER — Telehealth (HOSPITAL_COMMUNITY): Payer: Self-pay | Admitting: *Deleted

## 2022-02-10 ENCOUNTER — Other Ambulatory Visit (HOSPITAL_COMMUNITY): Payer: Self-pay | Admitting: *Deleted

## 2022-02-10 NOTE — Telephone Encounter (Signed)
Called [employer] which is his insurance company to get clarification on next steps as both the PA submitted one for Ingrezza and the other for his Prolixin D shot were denied. I am unfamiliar with this insurance as it is not one we generally accept at this clinic. For his PA to be approved the next step is a peer to peer call which is scheduled for tomorrow between 2-4 pm with Dr Morrie Sheldon. Will notify her of this.

## 2022-02-10 NOTE — Telephone Encounter (Signed)
Fax request for PA's for both his prolixin shot and his oral ingreza. Submitted per covermymeds. Waiting on a determination.

## 2022-02-17 ENCOUNTER — Telehealth (HOSPITAL_COMMUNITY): Payer: Self-pay | Admitting: *Deleted

## 2022-02-17 NOTE — Telephone Encounter (Signed)
Vm from insurance company that they will reconsider his Ingrezza patient assistance request. Resubmitted in CovermyMeds.

## 2022-03-03 ENCOUNTER — Telehealth (HOSPITAL_COMMUNITY): Payer: Self-pay | Admitting: *Deleted

## 2022-03-03 NOTE — Telephone Encounter (Signed)
When I arrived at work this am there was a note taped to my cabinet to ask that I schedule Jerry Carter a shot appt as he missed his last one. He has been scheduled for 8/17 at 350.

## 2022-03-05 ENCOUNTER — Ambulatory Visit (INDEPENDENT_AMBULATORY_CARE_PROVIDER_SITE_OTHER): Payer: No Payment, Other | Admitting: *Deleted

## 2022-03-05 ENCOUNTER — Encounter (HOSPITAL_COMMUNITY): Payer: Self-pay

## 2022-03-05 VITALS — BP 178/93 | HR 69 | Ht 70.0 in | Wt 160.0 lb

## 2022-03-05 DIAGNOSIS — F250 Schizoaffective disorder, bipolar type: Secondary | ICD-10-CM

## 2022-03-05 NOTE — Patient Instructions (Addendum)
Primary Care Provider Options  Internal Medicine [facility]  [address], [place], [place], [address] [phone]

## 2022-03-05 NOTE — Progress Notes (Signed)
Patient arrived Q 14 day fluPHENAZine decanoate (PROLIXIN) injection 25 mg Tolerated injection well in Right Gluteal

## 2022-03-06 ENCOUNTER — Telehealth (HOSPITAL_COMMUNITY): Payer: Self-pay | Admitting: *Deleted

## 2022-03-06 NOTE — Telephone Encounter (Signed)
He asked me to find him a medical provider since he is without one and when seen yesterday his BP was elevated. He has [employer] and I found two providers in his area in [place] and left him the infor for both of them. Also, left a message with a new appt for his shot which is 03/19/22 at 4 as yesterday when seen I mistakenly gave him a 28 day appt.

## 2022-03-09 ENCOUNTER — Encounter (HOSPITAL_COMMUNITY): Payer: No Payment, Other | Admitting: Student in an Organized Health Care Education/Training Program

## 2022-03-19 ENCOUNTER — Ambulatory Visit (HOSPITAL_COMMUNITY): Payer: Commercial Managed Care - HMO

## 2022-04-02 ENCOUNTER — Encounter (HOSPITAL_COMMUNITY): Payer: Self-pay

## 2022-04-02 ENCOUNTER — Ambulatory Visit (INDEPENDENT_AMBULATORY_CARE_PROVIDER_SITE_OTHER): Payer: Commercial Managed Care - HMO | Admitting: *Deleted

## 2022-04-02 ENCOUNTER — Ambulatory Visit (HOSPITAL_COMMUNITY): Payer: Commercial Managed Care - HMO

## 2022-04-02 VITALS — BP 138/83 | HR 63 | Ht 70.0 in | Wt 167.0 lb

## 2022-04-02 DIAGNOSIS — F250 Schizoaffective disorder, bipolar type: Secondary | ICD-10-CM

## 2022-04-02 NOTE — Progress Notes (Signed)
Walked in today which is clinic day but is late mostly inpart to me giving him a 28 day appt after he was last seen for his Prolixin shot and trying to call him with the correct time span of 14 days but never able to reach him, consequently his is 14 days late. He got a new phone which is why I was unable to reach him. New number added to his chart. Got his prolixin d 25 mg inj in his R GLUTEAL muscle. He is calmer today than in previous appts, denies any complaints. His focus today is on reaching his destiny, when asked what that is he said staying true to the message. He is religiously focused at each appt. States he continues to work hard and denies any concerns. He is to return in two weeks for his next inj.

## 2022-04-16 ENCOUNTER — Encounter (HOSPITAL_COMMUNITY): Payer: Self-pay

## 2022-04-16 ENCOUNTER — Ambulatory Visit (INDEPENDENT_AMBULATORY_CARE_PROVIDER_SITE_OTHER): Payer: No Payment, Other | Admitting: *Deleted

## 2022-04-16 VITALS — BP 170/96 | HR 66 | Ht 70.0 in | Wt 168.0 lb

## 2022-04-16 DIAGNOSIS — F250 Schizoaffective disorder, bipolar type: Secondary | ICD-10-CM

## 2022-04-16 NOTE — Progress Notes (Signed)
Patient arrived for injection ---  fluPHENAZine decanoate (PROLIXIN) 25 mg Tolerated injection well in Left Gluteal

## 2022-04-30 ENCOUNTER — Ambulatory Visit (INDEPENDENT_AMBULATORY_CARE_PROVIDER_SITE_OTHER): Payer: No Payment, Other | Admitting: Registered Nurse

## 2022-04-30 ENCOUNTER — Encounter (HOSPITAL_COMMUNITY): Payer: Self-pay

## 2022-04-30 ENCOUNTER — Ambulatory Visit (INDEPENDENT_AMBULATORY_CARE_PROVIDER_SITE_OTHER): Payer: Commercial Managed Care - HMO | Admitting: *Deleted

## 2022-04-30 ENCOUNTER — Encounter (HOSPITAL_COMMUNITY): Payer: Self-pay | Admitting: Registered Nurse

## 2022-04-30 VITALS — BP 151/91 | HR 71 | Ht 70.0 in | Wt 160.0 lb

## 2022-04-30 DIAGNOSIS — F250 Schizoaffective disorder, bipolar type: Secondary | ICD-10-CM | POA: Diagnosis not present

## 2022-04-30 DIAGNOSIS — G2401 Drug induced subacute dyskinesia: Secondary | ICD-10-CM | POA: Diagnosis not present

## 2022-04-30 DIAGNOSIS — F259 Schizoaffective disorder, unspecified: Secondary | ICD-10-CM

## 2022-04-30 DIAGNOSIS — T43505A Adverse effect of unspecified antipsychotics and neuroleptics, initial encounter: Secondary | ICD-10-CM | POA: Diagnosis not present

## 2022-04-30 MED ORDER — FLUPHENAZINE DECANOATE 25 MG/ML IJ SOLN: 25.0000 mg | INTRAMUSCULAR | 24 refills | Status: DC

## 2022-04-30 NOTE — Progress Notes (Signed)
In as scheduled for his Prolixin D 25 mg inj. He got his shot today in his R GLUTEAL muscle. He is pleasant, highly verbal, brought a picture he had drawn to share with staff, it was a spiritual picture depicting the black mans struggle. States he had an experience years ago where God came to him to mentor 1000 men. He showed a picture of his home on his phone, beautiful home with a for sale sign in the yard. Unsure if this is his home. He had lab drawn this appt and seen by the NP for his three month assessment. He is to return in two weeks for his next inj.

## 2022-04-30 NOTE — Progress Notes (Signed)
BH MD/PA/NP OP Progress Note  04/30/2022 4:23 PM Antwaun Buth  MRN:  [id number]  Chief Complaint:  Chief Complaint  Patient presents with   Follow-up    Psychiatric evaluation and administration of long acting injectable   HPI: Alcee Sipos is a 61 year old male seen face to face in office today for follow-up psychiatric evaluation and administration of long acting injectable.  His psychiatric history includes schizoaffective disorder bipolar type and Neuroleptic-induced tardive dyskinesia.  His mental health is managed with Prolixin 25 mg every 2 weeks, and Ingrezza 40 mg daily.  He reports Prolixin continues to work for him.  He states he never started the [place].  "I went to pick it up at the pharmacy and I couldn't afford it."  Asked if he wanted samples while working on get it to where he could afford it and he denied.  "The movement is not that bad, and I don't want to take any more medicine.  I'm trying not to add any more medicines."  He states he did not want to try anything differed that would be more affordable.  He is encouraged to inform staff if continues to manage his mental health well without adverse reaction.  He denies depression, anxiety, fluctuations in mood, abnormal movement, suicidal/self-harm/homicidal ideation, psychosis, and paranoia.  AIMS, PHQ 2 & 9, C-SSRS, and GAD 7 screenings conducted by provider see scores below.  He reports eating and sleeping without difficulty.  He denies the use of illicit drugs.  Devontae brings in a picture he has been working on and describes that it is about young black men that are going through the troubles of life and with the help of the holy spirit they are able to overcome.  (Black man coming out of ocean reaching for a Holy Bible carried by a white dove).    Visit Diagnosis:    ICD-10-CM   1. Schizoaffective disorder, bipolar type ([place])  F25.0 TSH    Lipid Profile    CBC with Differential    COMPLETE METABOLIC PANEL WITH GFR     Prolactin    Magnesium    HgB A1c    2. Neuroleptic-induced tardive dyskinesia  G24.01    T43.505A       Past Psychiatric History: schizoaffective disorder bipolar type Past Medical History:  Past Medical History:  Diagnosis Date   Bipolar disorder ([place])    Hypertension    Schizophrenia ([place])    History reviewed. No pertinent surgical history.  Family Psychiatric History: None reported  Family History: None reported  Social History:  Social History   Socioeconomic History   Marital status: Single    Spouse name: Not on file   Number of children: Not on file   Years of education: Not on file   Highest education level: Not on file  Occupational History   Not on file  Tobacco Use   Smoking status: Former    Types: Cigarettes   Smokeless tobacco: Never   Tobacco comments:    Quit smoking over 40 years ago -02/06/2022  Vaping Use   Vaping Use: Never used  Substance and Sexual Activity   Alcohol use: No    Alcohol/week: 0.0 standard drinks of alcohol   Drug use: No   Sexual activity: Not Currently  Other Topics Concern   Not on file  Social History Narrative   Not on file   Social Determinants of Health   Financial Resource Strain: Not on file  Food Insecurity: Not on file  Transportation Needs: Not on file  Physical Activity: Not on file  Stress: Not on file  Social Connections: Not on file    Allergies: No Known Allergies  Metabolic Disorder Labs: Lab Results  Component Value Date   HGBA1C 5.3 03/04/2017   No results found for: "PROLACTIN" Lab Results  Component Value Date   CHOL 166 07/26/2020   TRIG 51.0 07/26/2020   HDL 46.70 07/26/2020   CHOLHDL 4 07/26/2020   VLDL 10.2 07/26/2020   LDLCALC 109 (H) 07/26/2020   LDLCALC 107 (H) 12/09/2017   Lab Results  Component Value Date   TSH 1.29 07/26/2020   TSH 2.780 02/27/2014    Therapeutic Level Labs: Lab Results  Component Value Date   LITHIUM 0.53 (L) 03/03/2014   Lab Results   Component Value Date   VALPROATE 113.4 (H) 02/22/2014   No results found for: "CBMZ"  Current Medications: Current Outpatient Medications  Medication Sig Dispense Refill   atorvastatin (LIPITOR) 40 MG tablet TAKE 1 TABLET(40 MG) BY MOUTH DAILY 30 tablet 0   fluPHENAZine decanoate (PROLIXIN) 25 MG/ML injection Inject 1 mL (25 mg total) into the muscle every 14 (fourteen) days. 5 mL 24   losartan (COZAAR) 25 MG tablet Take 1 tablet (25 mg total) by mouth daily. 30 tablet 0   valbenazine (INGREZZA) 40 MG capsule Take 1 capsule (40 mg total) by mouth daily. 30 capsule 1   Current Facility-Administered Medications  Medication Dose Route Frequency Provider Last Rate Last Admin   fluPHENAZine decanoate (PROLIXIN) injection 25 mg  25 mg Intramuscular Q14 Days Eliseo Gum B, MD   25 mg at 04/02/22 1617     Musculoskeletal: Strength & Muscle Tone: within normal limits Gait & Station: normal Patient leans: N/A  Psychiatric Specialty Exam: Review of Systems  Constitutional: Negative.   HENT: Negative.    Eyes: Negative.   Respiratory: Negative.    Cardiovascular: Negative.   Gastrointestinal: Negative.   Genitourinary: Negative.   Musculoskeletal: Negative.   Skin: Negative.   Neurological: Negative.   Hematological: Negative.   Psychiatric/Behavioral:  Negative for agitation, behavioral problems, confusion, dysphoric mood, hallucinations, self-injury, sleep disturbance and suicidal ideas. The patient is not nervous/anxious.     There were no vitals taken for this visit.There is no height or weight on file to calculate BMI.  General Appearance: Casual  Eye Contact:  Good  Speech:  Clear and Coherent and Pressured  Volume:  Normal  Mood:  Euphoric  Affect:  Congruent  Thought Process:  Coherent, Goal Directed, and Descriptions of Associations: Intact  Orientation:  Full (Time, Place, and Person)  Thought Content: Tangential   Suicidal Thoughts:  No  Homicidal Thoughts:  No   Memory:  Immediate;   Good Recent;   Good Remote;   Good  Judgement:  Intact  Insight:  Present  Psychomotor Activity:  Normal and TD   Concentration:  Concentration: Good and Attention Span: Good  Recall:  Good  Fund of Knowledge: Fair  Language: Good  Akathisia:  No  Handed:  Right  AIMS (if indicated): done  Assets:  Communication Skills Desire for Improvement Housing Leisure Time Resilience Social Support  ADL's:  Intact  Cognition: Impaired,  Mild  Sleep:  Good   Screenings: AIMS    Flowsheet Row Office Visit from 02/06/2022 in [facility]  AIMS Total Score 6      AUDIT    Flowsheet Row [facility]  Alcohol Use Disorder Identification Test Final Score (AUDIT) 0      GAD-7    Flowsheet Row Office Visit from 04/30/2022 in [facility]  Total GAD-7 Score 0      PHQ2-9    Flowsheet Row Office Visit from 04/30/2022 in [facility] Office Visit from 12/09/2017 in [facility] Office Visit from 04/16/2017 in [facility] Office Visit from 03/04/2017 in [facility] Office Visit from 11/06/2015 in [facility] Total Score 0 0 0 0 0      Flowsheet Row Office Visit from 04/30/2022 in [facility] ED from 01[facility]19/2022 in [facility] from 02/08/2021 in [facility] RISK CATEGORY No Risk No Risk No Risk      Assessment and Plan: Reino Lybbert appears to be doing well.  He reports Prolixin  continues to be effective in managing his mental health.  Reports that the TD is not at the point that he wishes to start a new medication to help with TD.  Encouraged to notify provider if worsens.  Educated on benefits of starting medication.  Understanding  voiced.   During visit he is dressed appropriate for weather.  He is sitting upright in chair with no noted distress.  He is alert/oriented x 4, calm/cooperative and mood is congruent with affect.  He spoke in a clear tone at moderate volume, and pressured pace, with good eye contact.  His thought process is coherent, relevant, but appears to be some hyper religious aspects to conversation.  There is no indication that he is currently responding to internal/external stimuli, or experiencing delusional thought content.  He denies depression, anxiety, suicidal/self-harm/homicidal ideation, psychosis, paranoia, and fluctuations in mood.    1. Schizoaffective disorder, bipolar type (H[facility]) - TSH - Lipid Profile - CBC with Differential - COMPLETE METABOLIC PANEL WITH GFR - Prolactin - Magnesium - HgB A1c - fluPHENAZine decanoate (PROLIXIN) 25 MG/ML injection; Inject 1 mL (25 mg total) into the muscle every 14 (fourteen) days.  Dispense: 5 mL; Refill: 24  2. Neuroleptic-induced tardive dyskinesia    Collaboration of Care: Collaboration of Care: Medication Management AEB Administration of long acting injectable and refill of medications Meds ordered this encounter  Medications   fluPHENAZine decanoate (PROLIXIN) 25 MG/ML injection    Sig: Inject 1 mL (25 mg total) into the muscle every 14 (fourteen) days.    Dispense:  5 mL    Refill:  24    Order Specific Question:   Supervising Provider    Answer:   Nelly Rout [[id number]]     Patient/Guardian was advised Release of Information must be obtained prior to any record release in order to collaborate their care with an outside provider. Patient/Guardian was advised if they have not already done so to contact the registration department to sign all necessary forms in order for [place] to release information regarding their care.   Consent: Patient/Guardian gives verbal consent for treatment and assignment of benefits for services provided during this visit.  Patient/Guardian expressed understanding and agreed to proceed.    Mahad Newstrom, NP 04/30/2022, 4:23 PM

## 2022-05-07 LAB — PROLACTIN: Prolactin: 20.9 ng/mL — ABNORMAL HIGH (ref 4.0–15.2)

## 2022-05-07 LAB — CBC WITH DIFFERENTIAL/PLATELET
Basophils Absolute: 0 10*3/uL (ref 0.0–0.2)
Basos: 0 %
EOS (ABSOLUTE): 0.1 10*3/uL (ref 0.0–0.4)
Eos: 2 %
Hematocrit: 38.3 % (ref 37.5–51.0)
Hemoglobin: 12.8 g/dL — ABNORMAL LOW (ref 13.0–17.7)
Immature Grans (Abs): 0 10*3/uL (ref 0.0–0.1)
Immature Granulocytes: 0 %
Lymphocytes Absolute: 1.5 10*3/uL (ref 0.7–3.1)
Lymphs: 39 %
MCH: 27.2 pg (ref 26.6–33.0)
MCHC: 33.4 g/dL (ref 31.5–35.7)
MCV: 81 fL (ref 79–97)
Monocytes Absolute: 0.3 10*3/uL (ref 0.1–0.9)
Monocytes: 7 %
Neutrophils Absolute: 1.9 10*3/uL (ref 1.4–7.0)
Neutrophils: 52 %
Platelets: 164 10*3/uL (ref 150–450)
RBC: 4.71 x10E6/uL (ref 4.14–5.80)
RDW: 12.2 % (ref 11.6–15.4)
WBC: 3.8 10*3/uL (ref 3.4–10.8)

## 2022-05-07 LAB — TSH: TSH: 1.16 u[IU]/mL (ref 0.450–4.500)

## 2022-05-07 LAB — LIPID PANEL
Chol/HDL Ratio: 3.3 ratio (ref 0.0–5.0)
Cholesterol, Total: 146 mg/dL (ref 100–199)
HDL: 44 mg/dL (ref >39)
LDL Chol Calc (NIH): 91 mg/dL (ref 0–99)
Triglycerides: 49 mg/dL (ref 0–149)
VLDL Cholesterol Cal: 11 mg/dL (ref 5–40)

## 2022-05-07 LAB — HEMOGLOBIN A1C
Est. average glucose Bld gHb Est-mCnc: 123 mg/dL
Hgb A1c MFr Bld: 5.9 % — ABNORMAL HIGH (ref 4.8–5.6)

## 2022-05-07 LAB — MAGNESIUM: Magnesium: 1.9 mg/dL (ref 1.6–2.3)

## 2022-05-14 ENCOUNTER — Encounter (HOSPITAL_COMMUNITY): Payer: Self-pay

## 2022-05-14 ENCOUNTER — Ambulatory Visit (INDEPENDENT_AMBULATORY_CARE_PROVIDER_SITE_OTHER): Payer: Commercial Managed Care - HMO | Admitting: *Deleted

## 2022-05-14 VITALS — BP 147/98 | HR 73 | Ht 70.0 in | Wt 166.0 lb

## 2022-05-14 DIAGNOSIS — F250 Schizoaffective disorder, bipolar type: Secondary | ICD-10-CM | POA: Diagnosis not present

## 2022-05-14 MED ORDER — FLUPHENAZINE DECANOATE 25 MG/ML IJ SOLN
25.0000 mg | INTRAMUSCULAR | Status: DC
  Administered 2022-05-14 – 2022-06-19 (×3): 25 mg via INTRAMUSCULAR

## 2022-05-14 NOTE — Progress Notes (Signed)
In as scheduled for his Prolixin D inj of 25 mg given today In his L GLUTEAL muscle without issue. He is animated and very talkative. He is sharing that he has good news today that he will be on TV. 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 He is religiously focused which has been his base.ine in the time I have known him. He is excited re his many blessings and the associations he has he states all have good spirits and all the praise goes to God. He reports he continues to work and his next appt is scheduled at 4 pm to work around his job. He returns in 14 days.

## 2022-05-18 LAB — SPECIMEN STATUS REPORT

## 2022-05-28 ENCOUNTER — Encounter (HOSPITAL_COMMUNITY): Payer: Self-pay

## 2022-05-28 ENCOUNTER — Ambulatory Visit (HOSPITAL_COMMUNITY): Payer: Commercial Managed Care - HMO

## 2022-05-28 VITALS — BP 126/103 | HR 74 | Resp 100 | Ht 70.0 in | Wt 152.0 lb

## 2022-05-28 DIAGNOSIS — F250 Schizoaffective disorder, bipolar type: Secondary | ICD-10-CM

## 2022-05-28 DIAGNOSIS — G2401 Drug induced subacute dyskinesia: Secondary | ICD-10-CM

## 2022-05-28 NOTE — Progress Notes (Signed)
  Patient arrived for injection ---  fluPHENAZine decanoate (PROLIXIN) 25 mg Tolerated injection well in RIGHTGluteal

## 2022-06-10 ENCOUNTER — Ambulatory Visit (HOSPITAL_COMMUNITY): Payer: Commercial Managed Care - HMO

## 2022-06-11 ENCOUNTER — Ambulatory Visit (HOSPITAL_COMMUNITY): Payer: No Payment, Other

## 2022-06-19 ENCOUNTER — Encounter (HOSPITAL_COMMUNITY): Payer: Self-pay

## 2022-06-19 ENCOUNTER — Ambulatory Visit (HOSPITAL_COMMUNITY): Payer: Commercial Managed Care - HMO | Admitting: *Deleted

## 2022-06-19 VITALS — BP 172/98 | Temp 85.0°F | Ht 70.0 in | Wt 163.0 lb

## 2022-06-19 DIAGNOSIS — F250 Schizoaffective disorder, bipolar type: Secondary | ICD-10-CM

## 2022-06-19 NOTE — Progress Notes (Addendum)
PATIENT ARRIVED FOR INJECTION  fluPHENAZine decanoate (PROLIXIN) injection 25 mg  PATIENT IS 8 DAYS LATE FOR A Q-14 DAY INJECTION. STATED HE HAD LOST HIS PHONE & HAD TO GET ANOTHER ONE ASKED HOW HE WAS DOING & HE SAID HE WAS OK  TOLERATED INJECTION WELL IN  Tolerated injection well in LEFT Gluteal  PLEASANT AS ALWAYS  . NO SI/HI  NOR AH/VH

## 2022-06-23 ENCOUNTER — Ambulatory Visit (HOSPITAL_COMMUNITY): Payer: No Payment, Other

## 2022-06-25 ENCOUNTER — Ambulatory Visit (HOSPITAL_COMMUNITY): Payer: No Payment, Other

## 2022-07-09 ENCOUNTER — Encounter (HOSPITAL_COMMUNITY): Payer: Self-pay

## 2022-07-09 ENCOUNTER — Ambulatory Visit (HOSPITAL_COMMUNITY): Payer: Commercial Managed Care - HMO

## 2022-07-09 VITALS — BP 182/99 | HR 64 | Resp 14 | Ht 70.0 in | Wt 163.0 lb

## 2022-07-09 DIAGNOSIS — G2401 Drug induced subacute dyskinesia: Secondary | ICD-10-CM

## 2022-07-09 DIAGNOSIS — F250 Schizoaffective disorder, bipolar type: Secondary | ICD-10-CM

## 2022-07-09 NOTE — Progress Notes (Signed)
Patient arrived for injection ---  fluPHENAZine decanoate (PROLIXIN) 25 mg Tolerated injection well in LEFTGluteal

## 2022-07-16 ENCOUNTER — Encounter (HOSPITAL_COMMUNITY): Payer: Self-pay | Admitting: Registered Nurse

## 2022-07-16 ENCOUNTER — Ambulatory Visit (HOSPITAL_COMMUNITY): Payer: Commercial Managed Care - HMO

## 2022-07-16 ENCOUNTER — Other Ambulatory Visit (HOSPITAL_COMMUNITY): Payer: Self-pay | Admitting: Registered Nurse

## 2022-07-16 DIAGNOSIS — F250 Schizoaffective disorder, bipolar type: Secondary | ICD-10-CM

## 2022-07-16 MED ORDER — FLUPHENAZINE DECANOATE 25 MG/ML IJ SOLN: 12.5000 mg | INTRAMUSCULAR | Status: DC

## 2022-07-16 NOTE — Progress Notes (Signed)
Jerry Carter 73 yr. old male with a history of schizoaffective disorder bipolar type and Neuroleptic-induced tardive dyskinesia.  He is seen in office today for administration of his ling acting injectable Prolixin (Fluphenazine) 25 mg Q 14 days.  After chart review appears Mr. Jerry Carter may have started Prolixin around 2019 while being treated at [facility] for medication management.  Labs ordered 04/30/2022 including a Prolactin level.  See results below.  Unable to locate a prior prolactin level for baseline results.      Component Ref Range & Units 2 mo. ago Prolactin 4.0 - 15.2 ng/mL 20.9 High   Hyperprolactinemia can be avoided with some of the second-generation antipsychotic which may be more beneficial for Mr. Jerry Carter.  He has been on Abilify in the past and it may be a better choice than the Prolixin and would only require monthly injections instead of every two weeks.  Slight decrease in hemoglobin and increased HgbA1c will continue to monitor.  Life style and nutrition changes.    Consulted with Dr. Hampton Abbot on 07/16/2022 and recommended to decrease Prolixin to 12.5 mg IM today get a baseline EKG.  Patient to follow up in 2 weeks and if patient agreeable switch to Abilify.      Recent Results (from the past 2160 hour(s))  TSH     Status: None   Collection Time: 04/30/22 12:00 AM  Result Value Ref Range   TSH 1.160 0.450 - 4.500 uIU/mL  Lipid Profile     Status: None   Collection Time: 04/30/22 12:00 AM  Result Value Ref Range   Cholesterol, Total 146 100 - 199 mg/dL   Triglycerides 49 0 - 149 mg/dL   HDL 44 >39 mg/dL   VLDL Cholesterol Cal 11 5 - 40 mg/dL   LDL Chol Calc (NIH) 91 0 - 99 mg/dL   Chol/HDL Ratio 3.3 0.0 - 5.0 ratio    Comment:                                   T. Chol/HDL Ratio                                             Men  Women                               1/2 Avg.Risk  3.4    3.3                                   Avg.Risk  5.0    4.4                                 2X Avg.Risk  9.6    7.1                                3X Avg.Risk 23.4   11.0   CBC with Differential     Status: Abnormal   Collection Time: 04/30/22 12:00 AM  Result Value Ref Range   WBC 3.8 3.4 - 10.8 x10E3/uL  RBC 4.71 4.14 - 5.80 x10E6/uL   Hemoglobin 12.8 (L) 13.0 - 17.7 g/dL   Hematocrit 38.3 37.5 - 51.0 %   MCV 81 79 - 97 fL   MCH 27.2 26.6 - 33.0 pg   MCHC 33.4 31.5 - 35.7 g/dL   RDW 12.2 11.6 - 15.4 %   Platelets 164 150 - 450 x10E3/uL   Neutrophils 52 Not Estab. %   Lymphs 39 Not Estab. %   Monocytes 7 Not Estab. %   Eos 2 Not Estab. %   Basos 0 Not Estab. %   Neutrophils Absolute 1.9 1.4 - 7.0 x10E3/uL   Lymphocytes Absolute 1.5 0.7 - 3.1 x10E3/uL   Monocytes Absolute 0.3 0.1 - 0.9 x10E3/uL   EOS (ABSOLUTE) 0.1 0.0 - 0.4 x10E3/uL   Basophils Absolute 0.0 0.0 - 0.2 x10E3/uL   Immature Granulocytes 0 Not Estab. %   Immature Grans (Abs) 0.0 0.0 - 0.1 x10E3/uL  Prolactin     Status: Abnormal   Collection Time: 04/30/22 12:00 AM  Result Value Ref Range   Prolactin 20.9 (H) 4.0 - 15.2 ng/mL  Magnesium     Status: None   Collection Time: 04/30/22 12:00 AM  Result Value Ref Range   Magnesium 1.9 1.6 - 2.3 mg/dL  HgB A1c     Status: Abnormal   Collection Time: 04/30/22 12:00 AM  Result Value Ref Range   Hgb A1c MFr Bld 5.9 (H) 4.8 - 5.6 %    Comment:          Prediabetes: 5.7 - 6.4          Diabetes: >6.4          Glycemic control for adults with diabetes: <7.0    Est. average glucose Bld gHb Est-mCnc 123 mg/dL  Specimen status report     Status: None   Collection Time: 04/30/22 12:00 AM  Result Value Ref Range   specimen status report Comment     Comment: Written Authorization Written Authorization Written Authorization Received. Authorization received from [facility] 05-18-2022 Logged by Lisabeth Pick Poteat There is no longer any sample available for the following requested testing. Test # [id number]     Recommendations:  Decrease Prolixin to  12.5 mg IM today, Order EKG, Mr. Pretty to follow up in 2 weeks.  If agreeable will start on Abilify PO and in 2 weeks switch to [place].    Mr. Jerry Carter to be informed of lab results (Prolactin level), education to be provide on life style changes, and nutrition.  If he is agreeable to medication changes.  Will start Abilify PO at next visit.    Education provided on HgbA1c, Healthy eating, and exercise.  EKG ordered Order Prolixin 12.5 mg IM  Osiris Odriscoll, NP

## 2022-07-23 ENCOUNTER — Ambulatory Visit (HOSPITAL_COMMUNITY): Payer: No Payment, Other

## 2022-07-23 ENCOUNTER — Ambulatory Visit (INDEPENDENT_AMBULATORY_CARE_PROVIDER_SITE_OTHER): Payer: No Payment, Other | Admitting: Psychiatry

## 2022-07-23 ENCOUNTER — Encounter (HOSPITAL_COMMUNITY): Payer: Self-pay

## 2022-07-23 VITALS — BP 142/96 | HR 69 | Ht 70.0 in | Wt 169.0 lb

## 2022-07-23 DIAGNOSIS — F250 Schizoaffective disorder, bipolar type: Secondary | ICD-10-CM

## 2022-07-23 MED ORDER — ARIPIPRAZOLE 5 MG PO TABS: 5.0000 mg | ORAL_TABLET | Freq: Every day | ORAL | 0 refills | Status: DC

## 2022-07-23 MED ORDER — ARIPIPRAZOLE ER 400 MG IM PRSY
400.0000 mg | PREFILLED_SYRINGE | Freq: Once | INTRAMUSCULAR | Status: AC
  Administered 2022-08-06: 400 mg via INTRAMUSCULAR

## 2022-07-23 MED ORDER — ABILIFY MAINTENA 400 MG IM PRSY: 400.0000 mg | PREFILLED_SYRINGE | INTRAMUSCULAR | 11 refills | Status: DC

## 2022-07-23 NOTE — Progress Notes (Signed)
BH MD/PA/NP OP Progress Note  07/23/2022 4:42 PM Jerry Carter  MRN:  [id number]  Chief Complaint:  Chief Complaint  Patient presents with   Medication Management   HPI: 62 year old male seen today for follow up psychiatric evaluation. He has a psychiatric history of schizoaffective disorder and Neuroleptic-induced tardive dyskinesia . He is currently managed on Prolixin 12.5 mg every 14 days. He notes his medication is effective in managing his psychiatric conditions.   Today patient was well groomed, pleasant, cooperative, and engaged in conversation. He informed Probation officer that he is doing well. Provider discussed patients recent increased prolactin levels and prior recommendation by Ms. Rankin to switch to Abilify. He was agreeable to this.  Patient denies symptoms of anxiety and depression. He notes that his mood is stable and denies SI/HI/VAH mania or paranoia. He endorses adequate sleep and appetite.   Patient continues to work at [employer] and finds enjoyment in his job.  Today provider conducted an AIMS assessment and patient scored a 0.   Today patient did not receive his prolixin injectable. At this time prolixin discontinued. Patient agreeable to start Abilify 5 mg daily for two weeks. If tolerated patient will receive his first Abilify Maintena injection on 08/06/2022. No other concerns noted at this time. Visit Diagnosis:    ICD-10-CM   1. Schizoaffective disorder, bipolar type ([place])  F25.0 ARIPiprazole (ABILIFY) 5 MG tablet    ARIPiprazole ER (ABILIFY MAINTENA) 400 MG prefilled syringe 400 mg    ARIPiprazole ER (ABILIFY MAINTENA) 400 MG PRSY prefilled syringe      Past Psychiatric History: schizoaffective disorder and Neuroleptic-induced tardive dyskinesia   Past Medical History:  Past Medical History:  Diagnosis Date   Bipolar disorder ([place])    Hypertension    Schizophrenia ([place])    History reviewed. No pertinent surgical history.  Family Psychiatric History: None  reported  Family History: History reviewed. No pertinent family history.  Social History:  Social History   Socioeconomic History   Marital status: Single    Spouse name: Not on file   Number of children: Not on file   Years of education: Not on file   Highest education level: Not on file  Occupational History   Not on file  Tobacco Use   Smoking status: Former    Types: Cigarettes   Smokeless tobacco: Never   Tobacco comments:    Quit smoking over 40 years ago -02/06/2022  Vaping Use   Vaping Use: Never used  Substance and Sexual Activity   Alcohol use: No    Alcohol/week: 0.0 standard drinks of alcohol   Drug use: No   Sexual activity: Not Currently  Other Topics Concern   Not on file  Social History Narrative   Not on file   Social Determinants of Health   Financial Resource Strain: Not on file  Food Insecurity: Not on file  Transportation Needs: Not on file  Physical Activity: Not on file  Stress: Not on file  Social Connections: Not on file    Allergies: No Known Allergies  Metabolic Disorder Labs: Lab Results  Component Value Date   HGBA1C 5.9 (H) 04/30/2022   Lab Results  Component Value Date   PROLACTIN 20.9 (H) 04/30/2022   Lab Results  Component Value Date   CHOL 146 04/30/2022   TRIG 49 04/30/2022   HDL 44 04/30/2022   CHOLHDL 3.3 04/30/2022   VLDL 10.2 07/26/2020   LDLCALC 91 04/30/2022   LDLCALC 109 (H) 07/26/2020  Lab Results  Component Value Date   TSH 1.160 04/30/2022   TSH 1.29 07/26/2020    Therapeutic Level Labs: Lab Results  Component Value Date   LITHIUM 0.53 (L) 03/03/2014   Lab Results  Component Value Date   VALPROATE 113.4 (H) 02/22/2014   No results found for: "CBMZ"  Current Medications: Current Outpatient Medications  Medication Sig Dispense Refill   ARIPiprazole (ABILIFY) 5 MG tablet Take 1 tablet (5 mg total) by mouth daily. 14 tablet 0   ARIPiprazole ER (ABILIFY MAINTENA) 400 MG PRSY prefilled syringe  Inject 400 mg into the muscle every 30 (thirty) days. 1 each 11   atorvastatin (LIPITOR) 40 MG tablet TAKE 1 TABLET(40 MG) BY MOUTH DAILY 30 tablet 0   losartan (COZAAR) 25 MG tablet Take 1 tablet (25 mg total) by mouth daily. 30 tablet 0   Current Facility-Administered Medications  Medication Dose Route Frequency Provider Last Rate Last Admin   ARIPiprazole ER (ABILIFY MAINTENA) 400 MG prefilled syringe 400 mg  400 mg Intramuscular Once Toy Cookey E, NP         Musculoskeletal: Strength & Muscle Tone: within normal limits Gait & Station: normal Patient leans: N/A  Psychiatric Specialty Exam: Review of Systems  Blood pressure (!) 142/96, pulse 69, height 5\' 10"  (1.778 m), weight 169 lb (76.7 kg), SpO2 100 %.Body mass index is 24.25 kg/m.  General Appearance: Well Groomed  Eye Contact:  Good  Speech:  Clear and Coherent and Normal Rate  Volume:  Normal  Mood:  Euthymic  Affect:  Appropriate and Congruent  Thought Process:  Coherent, Goal Directed, and Linear  Orientation:  Full (Time, Place, and Person)  Thought Content: WDL and Logical   Suicidal Thoughts:  No  Homicidal Thoughts:  No  Memory:  Immediate;   Good Recent;   Good Remote;   Good  Judgement:  Good  Insight:  Good  Psychomotor Activity:  Normal  Concentration:  Concentration: Good and Attention Span: Good  Recall:  Good  Fund of Knowledge: Good  Language: Good  Akathisia:  No  Handed:  Right  AIMS (if indicated): not done  Assets:  Communication Skills Desire for Improvement Financial Resources/Insurance Housing Physical Health Social Support  ADL's:  Intact  Cognition: WNL  Sleep:  Good   Screenings: AIMS    Flowsheet Row Office Visit from 07/23/2022 in [facility] Office Visit from 02/06/2022 in [facility]  AIMS Total Score 0 6      AUDIT    Flowsheet Row [facility]  Alcohol Use Disorder Identification Test Final Score (AUDIT) 0      GAD-7    Flowsheet Row Office Visit from 04/30/2022 in [facility]  Total GAD-7 Score 0      PHQ2-9    Flowsheet Row Office Visit from 04/30/2022 in [facility] Office Visit from 12/09/2017 in [facility] Office Visit from 04/16/2017 in [facility] Office Visit from 03/04/2017 in [facility] Office Visit from 11/06/2015 in [facility] at [facility] Total Score 0 0 0 0 0      Flowsheet Row Office Visit from 04/30/2022 in [facility] ED from 01/19/2022 in [facility] DEPT ED from 02/08/2021 in [facility] RISK CATEGORY No Risk No Risk  No Risk        Assessment and Plan: Patient reports that his anxiety and depression are well managed. He denies symptoms of psycosis today. He also notes that his TD has improved. No noted signs of TD today.  Provider discussed patients recent increased prolactin levels and prior recommendation by Ms. Rankin to switch to Abilify. He was agreeable to this. Today patient did not receive his prolixin injectable. At this time prolixin discontinued. Patient agreeable to start Abilify 5 mg daily for two weeks. If tolerated patient will receive his first Abilify Maintena injection on 08/06/2022.   1. Schizoaffective disorder, bipolar type ([place])  Start- ARIPiprazole (ABILIFY) 5 MG tablet; Take 1 tablet (5 mg total) by mouth daily.  Dispense: 14 tablet; Refill: 0 Start- ARIPiprazole ER (ABILIFY MAINTENA) 400 MG prefilled syringe 400 mg Start- ARIPiprazole ER (ABILIFY MAINTENA) 400 MG PRSY prefilled syringe; Inject 400 mg into the muscle every 30 (thirty) days.  Dispense: 1 each; Refill: 11   Collaboration of Care: Collaboration of Care: Other provider involved in patient's  care AEB PCP and shot clinic staff  Patient/Guardian was advised Release of Information must be obtained prior to any record release in order to collaborate their care with an outside provider. Patient/Guardian was advised if they have not already done so to contact the registration department to sign all necessary forms in order for [place] to release information regarding their care.   Consent: Patient/Guardian gives verbal consent for treatment and assignment of benefits for services provided during this visit. Patient/Guardian expressed understanding and agreed to proceed.   Follow up in 2 weeks for shot clinic Follow up in 3 months with provider   Salley Slaughter, NP 07/23/2022, 4:42 PM

## 2022-08-06 ENCOUNTER — Encounter (HOSPITAL_COMMUNITY): Payer: Self-pay

## 2022-08-06 ENCOUNTER — Ambulatory Visit (HOSPITAL_COMMUNITY): Payer: Medicaid Other

## 2022-08-06 ENCOUNTER — Ambulatory Visit (HOSPITAL_COMMUNITY): Payer: No Payment, Other

## 2022-08-06 ENCOUNTER — Ambulatory Visit (INDEPENDENT_AMBULATORY_CARE_PROVIDER_SITE_OTHER): Payer: Medicaid Other | Admitting: *Deleted

## 2022-08-06 VITALS — BP 161/91 | HR 83 | Temp 98.1°F | Resp 16 | Ht 70.0 in | Wt 166.0 lb

## 2022-08-06 DIAGNOSIS — F250 Schizoaffective disorder, bipolar type: Secondary | ICD-10-CM | POA: Diagnosis not present

## 2022-08-06 NOTE — Progress Notes (Signed)
Patient arrived for injection ---    ARIPiprazole ER (ABILIFY MAINTENA) 400 MG   Tolerated injection well in RIGHTGluteal

## 2022-08-11 ENCOUNTER — Ambulatory Visit (HOSPITAL_COMMUNITY): Payer: No Payment, Other

## 2022-09-03 ENCOUNTER — Encounter (HOSPITAL_COMMUNITY): Payer: Self-pay

## 2022-09-03 ENCOUNTER — Ambulatory Visit (INDEPENDENT_AMBULATORY_CARE_PROVIDER_SITE_OTHER): Payer: Medicaid Other | Admitting: *Deleted

## 2022-09-03 VITALS — BP 173/105 | HR 87 | Temp 97.7°F | Resp 18 | Ht 70.0 in | Wt 166.8 lb

## 2022-09-03 DIAGNOSIS — F250 Schizoaffective disorder, bipolar type: Secondary | ICD-10-CM

## 2022-09-03 MED ORDER — ARIPIPRAZOLE ER 400 MG IM PRSY
400.0000 mg | PREFILLED_SYRINGE | Freq: Once | INTRAMUSCULAR | Status: AC
  Administered 2022-09-03: 400 mg via INTRAMUSCULAR

## 2022-09-03 NOTE — Progress Notes (Signed)
Patient arrived for injection of Abilify Maintena 461m. Given in Left Upper Outer Quadrant without issues or complaints. Patient very pleasant, discussing saving $100 a week because he is planning for a family. Reading books suggested by his manager at work and hoping to become a mFreight forwardersoon at [employer]  States his medication is working good for him.

## 2022-09-16 ENCOUNTER — Ambulatory Visit (INDEPENDENT_AMBULATORY_CARE_PROVIDER_SITE_OTHER): Payer: Self-pay | Admitting: Primary Care

## 2022-10-01 ENCOUNTER — Ambulatory Visit (INDEPENDENT_AMBULATORY_CARE_PROVIDER_SITE_OTHER): Payer: Medicaid Other | Admitting: *Deleted

## 2022-10-01 ENCOUNTER — Encounter (HOSPITAL_COMMUNITY): Payer: Self-pay

## 2022-10-01 ENCOUNTER — Other Ambulatory Visit (HOSPITAL_COMMUNITY): Payer: Self-pay | Admitting: Psychiatry

## 2022-10-01 VITALS — BP 163/109 | HR 70 | Ht 70.0 in | Wt 169.0 lb

## 2022-10-01 DIAGNOSIS — F250 Schizoaffective disorder, bipolar type: Secondary | ICD-10-CM | POA: Diagnosis not present

## 2022-10-01 MED ORDER — ABILIFY MAINTENA 400 MG IM PRSY: 400.0000 mg | PREFILLED_SYRINGE | INTRAMUSCULAR | 11 refills | Status: DC

## 2022-10-01 MED ORDER — ARIPIPRAZOLE ER 400 MG IM PRSY
400.0000 mg | PREFILLED_SYRINGE | INTRAMUSCULAR | Status: AC
  Administered 2022-10-01 – 2022-12-10 (×3): 400 mg via INTRAMUSCULAR

## 2022-10-01 NOTE — Progress Notes (Signed)
In as scheduled today for his monthly inj of Abilify M 400 mg inj, given today in his R GLUTEAL muscle without issue. 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 He danced in the office while speaking with Dr Ronne Binning and that he wants a wife. He is religiously focused but in my opinion not more than usual. He is to return in 28 days for his next shot. See Dr Ronne Binning note for her information.

## 2022-10-29 ENCOUNTER — Encounter (HOSPITAL_COMMUNITY): Payer: Self-pay

## 2022-10-29 ENCOUNTER — Ambulatory Visit (INDEPENDENT_AMBULATORY_CARE_PROVIDER_SITE_OTHER): Payer: Medicaid Other | Admitting: *Deleted

## 2022-10-29 VITALS — BP 154/95 | HR 71 | Ht 70.0 in | Wt 156.0 lb

## 2022-10-29 DIAGNOSIS — F250 Schizoaffective disorder, bipolar type: Secondary | ICD-10-CM

## 2022-10-29 DIAGNOSIS — G2401 Drug induced subacute dyskinesia: Secondary | ICD-10-CM

## 2022-10-29 NOTE — Progress Notes (Signed)
In today for his monthly inj of Abilify M 400 mg given today in his L GLUTEAL muscle without issue. He is is usual pleasant hyper talkative self. He denies any issues. He brought with him today a rolling suitcase, bookbag, art portfolio and a plastic bag. 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 He stated today he has an art degree from [employer]. 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 He has many plans for himself and stays very active. He is to return in 28 days for his next shot.

## 2022-11-26 ENCOUNTER — Encounter (HOSPITAL_COMMUNITY): Payer: Self-pay

## 2022-11-26 ENCOUNTER — Ambulatory Visit (HOSPITAL_COMMUNITY): Payer: Medicaid Other | Admitting: *Deleted

## 2022-11-26 ENCOUNTER — Other Ambulatory Visit (HOSPITAL_COMMUNITY): Payer: Self-pay | Admitting: Psychiatry

## 2022-11-26 VITALS — BP 157/105 | HR 61 | Resp 16 | Ht 70.0 in | Wt 161.6 lb

## 2022-11-26 DIAGNOSIS — F250 Schizoaffective disorder, bipolar type: Secondary | ICD-10-CM

## 2022-11-26 DIAGNOSIS — Z0000 Encounter for general adult medical examination without abnormal findings: Secondary | ICD-10-CM

## 2022-11-26 MED ORDER — ABILIFY MAINTENA 400 MG IM PRSY: 400.0000 mg | PREFILLED_SYRINGE | INTRAMUSCULAR | 11 refills | Status: DC

## 2022-11-26 NOTE — Progress Notes (Signed)
Patient arrived for injection without his medication. States he forgot to get it. This Clinical research associate called his pharmacy to have it filled and he will return next Thursday with his medication that he will pick up. Was given referral to be seen at a medical facility for his increased blood pressure by Dr. Doyne Keel. No other issues or complaints. Will receive his injection next week.

## 2022-11-26 NOTE — Telephone Encounter (Signed)
Patient came to clinic today without his injection.  Provider informed patient that injection would have to be brought in as the clinic does not have any more samples.  He endorsed understanding and agreed.  Patient's blood pressure elevated today.  Provider referred patient to community [facility] and wellness.  Patient has not had labs drawn in over 6 months.  Today provider ordered CBC, CMP, LFT, thyroid panel, lipid panel, and Hgb A1c.

## 2022-11-27 LAB — CBC WITH DIFFERENTIAL/PLATELET
Basophils Absolute: 0 10*3/uL (ref 0.0–0.2)
Basos: 0 %
EOS (ABSOLUTE): 0 10*3/uL (ref 0.0–0.4)
Eos: 1 %
Hematocrit: 39.1 % (ref 37.5–51.0)
Hemoglobin: 12.6 g/dL — ABNORMAL LOW (ref 13.0–17.7)
Immature Grans (Abs): 0 10*3/uL (ref 0.0–0.1)
Immature Granulocytes: 0 %
Lymphocytes Absolute: 1.2 10*3/uL (ref 0.7–3.1)
Lymphs: 30 %
MCH: 27.1 pg (ref 26.6–33.0)
MCHC: 32.2 g/dL (ref 31.5–35.7)
MCV: 84 fL (ref 79–97)
Monocytes Absolute: 0.3 10*3/uL (ref 0.1–0.9)
Monocytes: 6 %
Neutrophils Absolute: 2.5 10*3/uL (ref 1.4–7.0)
Neutrophils: 63 %
Platelets: 142 10*3/uL — ABNORMAL LOW (ref 150–450)
RBC: 4.65 x10E6/uL (ref 4.14–5.80)
RDW: 13.2 % (ref 11.6–15.4)
WBC: 3.9 10*3/uL (ref 3.4–10.8)

## 2022-11-27 LAB — COMPREHENSIVE METABOLIC PANEL
ALT: 30 IU/L (ref 0–44)
AST: 31 IU/L (ref 0–40)
Albumin/Globulin Ratio: 1.6 (ref 1.2–2.2)
Albumin: 4 g/dL (ref 3.9–4.9)
Alkaline Phosphatase: 61 IU/L (ref 44–121)
BUN/Creatinine Ratio: 8 — ABNORMAL LOW (ref 10–24)
BUN: 9 mg/dL (ref 8–27)
Bilirubin Total: 0.5 mg/dL (ref 0.0–1.2)
CO2: 21 mmol/L (ref 20–29)
Calcium: 8.7 mg/dL (ref 8.6–10.2)
Chloride: 104 mmol/L (ref 96–106)
Creatinine, Ser: 1.1 mg/dL (ref 0.76–1.27)
Globulin, Total: 2.5 g/dL (ref 1.5–4.5)
Glucose: 140 mg/dL — ABNORMAL HIGH (ref 70–99)
Potassium: 4.2 mmol/L (ref 3.5–5.2)
Sodium: 139 mmol/L (ref 134–144)
Total Protein: 6.5 g/dL (ref 6.0–8.5)
eGFR: 75 mL/min/{1.73_m2} (ref >59)

## 2022-11-27 LAB — HEPATIC FUNCTION PANEL: Bilirubin, Direct: 0.14 mg/dL (ref 0.00–0.40)

## 2022-11-27 LAB — HEMOGLOBIN A1C
Est. average glucose Bld gHb Est-mCnc: 126 mg/dL
Hgb A1c MFr Bld: 6 % — ABNORMAL HIGH (ref 4.8–5.6)

## 2022-11-27 LAB — LIPID PANEL
Chol/HDL Ratio: 2.7 ratio (ref 0.0–5.0)
Cholesterol, Total: 143 mg/dL (ref 100–199)
HDL: 53 mg/dL (ref >39)
LDL Chol Calc (NIH): 81 mg/dL (ref 0–99)
Triglycerides: 40 mg/dL (ref 0–149)
VLDL Cholesterol Cal: 9 mg/dL (ref 5–40)

## 2022-11-27 LAB — THYROID PANEL WITH TSH
Free Thyroxine Index: 1.7 (ref 1.2–4.9)
T3 Uptake Ratio: 26 % (ref 24–39)
T4, Total: 6.7 ug/dL (ref 4.5–12.0)
TSH: 1.11 u[IU]/mL (ref 0.450–4.500)

## 2022-11-27 LAB — SPECIMEN STATUS REPORT

## 2022-11-30 ENCOUNTER — Telehealth (HOSPITAL_COMMUNITY): Payer: Self-pay | Admitting: *Deleted

## 2022-11-30 NOTE — Telephone Encounter (Signed)
Rx Notified : [id number] - [place], [place] - [address]   No Insurance Info available & tried to contact patient & NO RESPONSE.   Please contact Rx to provide insurance info so we can order the   ARIPiprazole ER (ABILIFY MAINTENA) 400 MG prefilled syringe 400 mg [[id number]] ARIPiprazole ER (ABILIFY MAINTENA) 400 MG prefilled syringe   Called Rx & they ran the medicaid of [place]  & it stated that he has other insurance unable to process the Generic Worker S [employer] without a [employer] # & Group #

## 2022-12-01 ENCOUNTER — Other Ambulatory Visit: Payer: Self-pay

## 2022-12-01 ENCOUNTER — Ambulatory Visit (HOSPITAL_COMMUNITY)
Admission: EM | Admit: 2022-12-01 | Discharge: 2022-12-01 | Disposition: A | Discharge status: Other Acute Inpatient Hospital | Payer: Medicaid Other | Attending: Physician Assistant | Admitting: Physician Assistant

## 2022-12-01 ENCOUNTER — Encounter (HOSPITAL_COMMUNITY): Payer: Self-pay

## 2022-12-01 ENCOUNTER — Emergency Department (HOSPITAL_COMMUNITY): Payer: Medicaid Other

## 2022-12-01 ENCOUNTER — Observation Stay (HOSPITAL_COMMUNITY): Payer: Medicaid Other

## 2022-12-01 ENCOUNTER — Observation Stay (HOSPITAL_COMMUNITY)
Admission: EM | Admit: 2022-12-01 | Discharge: 2022-12-02 | Disposition: A | Discharge status: Home / Self Care | Payer: Medicaid Other | Attending: Internal Medicine | Admitting: Internal Medicine

## 2022-12-01 ENCOUNTER — Observation Stay (HOSPITAL_BASED_OUTPATIENT_CLINIC_OR_DEPARTMENT_OTHER): Payer: Medicaid Other

## 2022-12-01 DIAGNOSIS — R4781 Slurred speech: Secondary | ICD-10-CM | POA: Diagnosis not present

## 2022-12-01 DIAGNOSIS — R42 Dizziness and giddiness: Secondary | ICD-10-CM | POA: Diagnosis not present

## 2022-12-01 DIAGNOSIS — E162 Hypoglycemia, unspecified: Secondary | ICD-10-CM

## 2022-12-01 DIAGNOSIS — Z87891 Personal history of nicotine dependence: Secondary | ICD-10-CM | POA: Insufficient documentation

## 2022-12-01 DIAGNOSIS — D649 Anemia, unspecified: Secondary | ICD-10-CM | POA: Insufficient documentation

## 2022-12-01 DIAGNOSIS — I161 Hypertensive emergency: Secondary | ICD-10-CM

## 2022-12-01 DIAGNOSIS — R29818 Other symptoms and signs involving the nervous system: Secondary | ICD-10-CM | POA: Diagnosis not present

## 2022-12-01 DIAGNOSIS — F250 Schizoaffective disorder, bipolar type: Secondary | ICD-10-CM | POA: Diagnosis present

## 2022-12-01 DIAGNOSIS — G459 Transient cerebral ischemic attack, unspecified: Secondary | ICD-10-CM

## 2022-12-01 DIAGNOSIS — R9431 Abnormal electrocardiogram [ECG] [EKG]: Secondary | ICD-10-CM

## 2022-12-01 DIAGNOSIS — Z79899 Other long term (current) drug therapy: Secondary | ICD-10-CM | POA: Diagnosis not present

## 2022-12-01 DIAGNOSIS — E785 Hyperlipidemia, unspecified: Secondary | ICD-10-CM | POA: Diagnosis not present

## 2022-12-01 DIAGNOSIS — I10 Essential (primary) hypertension: Secondary | ICD-10-CM | POA: Diagnosis not present

## 2022-12-01 DIAGNOSIS — I159 Secondary hypertension, unspecified: Secondary | ICD-10-CM | POA: Diagnosis not present

## 2022-12-01 DIAGNOSIS — E8809 Other disorders of plasma-protein metabolism, not elsewhere classified: Secondary | ICD-10-CM | POA: Diagnosis not present

## 2022-12-01 DIAGNOSIS — F259 Schizoaffective disorder, unspecified: Secondary | ICD-10-CM | POA: Diagnosis present

## 2022-12-01 LAB — I-STAT CHEM 8, ED
BUN: 17 mg/dL (ref 8–23)
Calcium, Ion: 1.14 mmol/L — ABNORMAL LOW (ref 1.15–1.40)
Chloride: 104 mmol/L (ref 98–111)
Creatinine, Ser: 1.1 mg/dL (ref 0.61–1.24)
Glucose, Bld: 171 mg/dL — ABNORMAL HIGH (ref 70–99)
HCT: 40 % (ref 39.0–52.0)
Hemoglobin: 13.6 g/dL (ref 13.0–17.0)
Potassium: 4 mmol/L (ref 3.5–5.1)
Sodium: 141 mmol/L (ref 135–145)
TCO2: 28 mmol/L (ref 22–32)

## 2022-12-01 LAB — COMPREHENSIVE METABOLIC PANEL
ALT: 24 U/L (ref 0–44)
AST: 26 U/L (ref 15–41)
Albumin: 3.3 g/dL — ABNORMAL LOW (ref 3.5–5.0)
Alkaline Phosphatase: 60 U/L (ref 38–126)
Anion gap: 8 (ref 5–15)
BUN: 15 mg/dL (ref 8–23)
CO2: 25 mmol/L (ref 22–32)
Calcium: 9 mg/dL (ref 8.9–10.3)
Chloride: 105 mmol/L (ref 98–111)
Creatinine, Ser: 1.1 mg/dL (ref 0.61–1.24)
GFR, Estimated: >60 mL/min (ref >60)
Glucose, Bld: 173 mg/dL — ABNORMAL HIGH (ref 70–99)
Potassium: 3.9 mmol/L (ref 3.5–5.1)
Sodium: 138 mmol/L (ref 135–145)
Total Bilirubin: 0.5 mg/dL (ref 0.3–1.2)
Total Protein: 6.4 g/dL — ABNORMAL LOW (ref 6.5–8.1)

## 2022-12-01 LAB — CBC
HCT: 41.4 % (ref 39.0–52.0)
Hemoglobin: 12.5 g/dL — ABNORMAL LOW (ref 13.0–17.0)
MCH: 26.5 pg (ref 26.0–34.0)
MCHC: 30.2 g/dL (ref 30.0–36.0)
MCV: 87.9 fL (ref 80.0–100.0)
Platelets: 141 10*3/uL — ABNORMAL LOW (ref 150–400)
RBC: 4.71 MIL/uL (ref 4.22–5.81)
RDW: 14.1 % (ref 11.5–15.5)
WBC: 3.1 10*3/uL — ABNORMAL LOW (ref 4.0–10.5)
nRBC: 0 % (ref 0.0–0.2)

## 2022-12-01 LAB — ECHOCARDIOGRAM COMPLETE
Area-P 1/2: 3.06 cm2
Height: 70 in
S' Lateral: 2.8 cm
Weight: 2640 oz

## 2022-12-01 LAB — PROTIME-INR
INR: 1.1 (ref 0.8–1.2)
Prothrombin Time: 14 seconds (ref 11.4–15.2)

## 2022-12-01 LAB — DIFFERENTIAL
Abs Immature Granulocytes: 0 10*3/uL (ref 0.00–0.07)
Basophils Absolute: 0 10*3/uL (ref 0.0–0.1)
Basophils Relative: 0 %
Eosinophils Absolute: 0.1 10*3/uL (ref 0.0–0.5)
Eosinophils Relative: 3 %
Immature Granulocytes: 0 %
Lymphocytes Relative: 31 %
Lymphs Abs: 1 10*3/uL (ref 0.7–4.0)
Monocytes Absolute: 0.3 10*3/uL (ref 0.1–1.0)
Monocytes Relative: 9 %
Neutro Abs: 1.7 10*3/uL (ref 1.7–7.7)
Neutrophils Relative %: 57 %

## 2022-12-01 LAB — URINALYSIS, ROUTINE W REFLEX MICROSCOPIC
Bilirubin Urine: NEGATIVE
Glucose, UA: 150 mg/dL — AB
Hgb urine dipstick: NEGATIVE
Ketones, ur: NEGATIVE mg/dL
Leukocytes,Ua: NEGATIVE
Nitrite: NEGATIVE
Protein, ur: NEGATIVE mg/dL
Specific Gravity, Urine: 1.011 (ref 1.005–1.030)
pH: 6 (ref 5.0–8.0)

## 2022-12-01 LAB — ETHANOL: Alcohol, Ethyl (B): <10 mg/dL (ref <10)

## 2022-12-01 LAB — RAPID URINE DRUG SCREEN, HOSP PERFORMED
Amphetamines: NOT DETECTED
Barbiturates: NOT DETECTED
Benzodiazepines: NOT DETECTED
Cocaine: NOT DETECTED
Opiates: NOT DETECTED
Tetrahydrocannabinol: NOT DETECTED

## 2022-12-01 LAB — HIV ANTIBODY (ROUTINE TESTING W REFLEX): HIV Screen 4th Generation wRfx: NONREACTIVE

## 2022-12-01 LAB — TROPONIN I (HIGH SENSITIVITY)
Troponin I (High Sensitivity): 10 ng/L (ref <18)
Troponin I (High Sensitivity): 11 ng/L (ref <18)

## 2022-12-01 LAB — POCT FASTING CBG KUC MANUAL ENTRY: POCT Glucose (KUC): 60 mg/dL — AB (ref 70–99)

## 2022-12-01 LAB — APTT: aPTT: 27 seconds (ref 24–36)

## 2022-12-01 LAB — CBG MONITORING, ED: Glucose-Capillary: 181 mg/dL — ABNORMAL HIGH (ref 70–99)

## 2022-12-01 MED ORDER — ACETAMINOPHEN 325 MG PO TABS: 650.0000 mg | ORAL_TABLET | ORAL | Status: DC | PRN

## 2022-12-01 MED ORDER — DEXTROSE 50 % IV SOLN
INTRAVENOUS | Status: AC
  Filled 2022-12-01: qty 50

## 2022-12-01 MED ORDER — ATORVASTATIN CALCIUM 40 MG PO TABS
40.0000 mg | ORAL_TABLET | Freq: Every day | ORAL | Status: DC
  Administered 2022-12-02: 40 mg via ORAL
  Filled 2022-12-01: qty 1

## 2022-12-01 MED ORDER — ENOXAPARIN SODIUM 40 MG/0.4ML IJ SOSY
40.0000 mg | PREFILLED_SYRINGE | INTRAMUSCULAR | Status: DC
  Administered 2022-12-01: 40 mg via SUBCUTANEOUS
  Filled 2022-12-01: qty 0.4

## 2022-12-01 MED ORDER — LORAZEPAM 2 MG/ML IJ SOLN
1.0000 mg | Freq: Once | INTRAMUSCULAR | Status: AC | PRN
  Administered 2022-12-01: 1 mg via INTRAVENOUS
  Filled 2022-12-01: qty 1

## 2022-12-01 MED ORDER — CLOPIDOGREL BISULFATE 75 MG PO TABS
75.0000 mg | ORAL_TABLET | Freq: Every day | ORAL | Status: DC
  Administered 2022-12-01 – 2022-12-02 (×2): 75 mg via ORAL
  Filled 2022-12-01 (×2): qty 1

## 2022-12-01 MED ORDER — DEXTROSE 50 % IV SOLN
1.0000 | Freq: Once | INTRAVENOUS | Status: AC
  Administered 2022-12-01: 50 mL via INTRAVENOUS

## 2022-12-01 MED ORDER — SENNOSIDES-DOCUSATE SODIUM 8.6-50 MG PO TABS: 1.0000 | ORAL_TABLET | Freq: Every evening | ORAL | Status: DC | PRN

## 2022-12-01 MED ORDER — IOHEXOL 350 MG/ML SOLN
75.0000 mL | Freq: Once | INTRAVENOUS | Status: AC | PRN
  Administered 2022-12-01: 75 mL via INTRAVENOUS

## 2022-12-01 MED ORDER — STROKE: EARLY STAGES OF RECOVERY BOOK
Freq: Once | Status: AC
  Filled 2022-12-01: qty 1

## 2022-12-01 MED ORDER — ASPIRIN 81 MG PO TBEC
81.0000 mg | DELAYED_RELEASE_TABLET | Freq: Every day | ORAL | Status: DC
  Administered 2022-12-01 – 2022-12-02 (×2): 81 mg via ORAL
  Filled 2022-12-01 (×2): qty 1

## 2022-12-01 MED ORDER — ACETAMINOPHEN 160 MG/5ML PO SOLN: 650.0000 mg | ORAL | Status: DC | PRN

## 2022-12-01 MED ORDER — ACETAMINOPHEN 650 MG RE SUPP: 650.0000 mg | RECTAL | Status: DC | PRN

## 2022-12-01 NOTE — ED Provider Notes (Signed)
[place] EMERGENCY DEPARTMENT AT [facility] Provider Note   CSN: [id number] Arrival date & time: 12/01/22  1045     History  Chief Complaint  Patient presents with   Dizziness    From UC via [facility] for c/o stroke like symptoms. Per ems woke up yesterday with dizziness that seemed to get worse over the day. Today woke up with slurred speech and difficulty with word finding.    Aphasia    Jerry Carter is a 62 y.o. male with a past medical history significant for hypertension, hyperlipidemia, and schizophrenia who presents to the ED from urgent care due to dizziness and slurred speech to rule out CVA.  Patient states dizziness started around 7 or 8 AM yesterday morning.  He describes it as feeling off balance however, described to urgent care as a room spinning sensation.  Patient then states he was told by urgent care that he had some slurred speech.  Denies visual changes.  He notes he needs glasses however, has not has any visual changes in the past 24 hours.  No previous CVA.  Patient notes he has not taken his medication for hypertension or hyperlipidemia in numerous months.  Denies chest pain or shortness of breath.  No lower extremity edema. No history of diabetes.  Denies unilateral weakness.   History obtained from patient and past medical records. No interpreter used during encounter.       Home Medications Prior to Admission medications   Medication Sig Start Date End Date Taking? Authorizing Provider  ARIPiprazole ER (ABILIFY MAINTENA) 400 MG PRSY prefilled syringe Inject 400 mg into the muscle every 30 (thirty) days. 11/26/22   Shanna Cisco, NP  atorvastatin (LIPITOR) 40 MG tablet TAKE 1 TABLET(40 MG) BY MOUTH DAILY 01/19/22   Curatolo, Adam, DO  losartan (COZAAR) 25 MG tablet Take 1 tablet (25 mg total) by mouth daily. 01/19/22 02/18/22  Curatolo, Adam, DO  candesartan (ATACAND) 4 MG tablet Take 1 tablet (4 mg total) by mouth daily. 12/09/17 04/16/19  Shon Hale, MD      Allergies    Patient has no known allergies.    Review of Systems   Review of Systems  Eyes:  Negative for visual disturbance.  Respiratory:  Negative for shortness of breath.   Cardiovascular:  Negative for chest pain.  Neurological:  Positive for dizziness and speech difficulty. Negative for weakness.    Physical Exam Updated Vital Signs BP (!) 171/98   Pulse 61   Temp 97.8 F (36.6 C) (Oral)   Resp (!) 21   Ht 5\' 10"  (1.778 m)   Wt 74.8 kg   SpO2 100%   BMI 23.68 kg/m  Physical Exam Vitals and nursing note reviewed.  Constitutional:      General: He is not in acute distress.    Appearance: He is not ill-appearing.  HENT:     Head: Normocephalic.  Eyes:     Pupils: Pupils are equal, round, and reactive to light.  Cardiovascular:     Rate and Rhythm: Normal rate and regular rhythm.     Pulses: Normal pulses.     Heart sounds: Normal heart sounds. No murmur heard.    No friction rub. No gallop.  Pulmonary:     Effort: Pulmonary effort is normal.     Breath sounds: Normal breath sounds.  Abdominal:     General: Abdomen is flat. There is no distension.     Palpations: Abdomen is soft.  Tenderness: There is no abdominal tenderness. There is no guarding or rebound.  Musculoskeletal:        General: Normal range of motion.     Cervical back: Neck supple.  Skin:    General: Skin is warm and dry.  Neurological:     General: No focal deficit present.     Mental Status: He is alert.     Comments: Slowed speech with occasional slurred speech No pronator drift Equal grip strength No facial droop 5/5 strength in all 4 extremities No visual field deficits  Psychiatric:        Mood and Affect: Mood normal.        Behavior: Behavior normal.     ED Results / Procedures / Treatments   Labs (all labs ordered are listed, but only abnormal results are displayed) Labs Reviewed  CBC - Abnormal; Notable for the following components:      Result  Value   WBC 3.1 (*)    Hemoglobin 12.5 (*)    Platelets 141 (*)    All other components within normal limits  COMPREHENSIVE METABOLIC PANEL - Abnormal; Notable for the following components:   Glucose, Bld 173 (*)    Total Protein 6.4 (*)    Albumin 3.3 (*)    All other components within normal limits  URINALYSIS, ROUTINE W REFLEX MICROSCOPIC - Abnormal; Notable for the following components:   Color, Urine STRAW (*)    Glucose, UA 150 (*)    All other components within normal limits  CBG MONITORING, ED - Abnormal; Notable for the following components:   Glucose-Capillary 181 (*)    All other components within normal limits  I-STAT CHEM 8, ED - Abnormal; Notable for the following components:   Glucose, Bld 171 (*)    Calcium, Ion 1.14 (*)    All other components within normal limits  ETHANOL  PROTIME-INR  APTT  DIFFERENTIAL  RAPID URINE DRUG SCREEN, HOSP PERFORMED  TROPONIN I (HIGH SENSITIVITY)  TROPONIN I (HIGH SENSITIVITY)    EKG None  Radiology CT ANGIO HEAD NECK W WO CM  Result Date: 12/01/2022 CLINICAL DATA:  62 year old male with dizziness, slurred speech onset this morning. EXAM: CT ANGIOGRAPHY HEAD AND NECK WITH AND WITHOUT CONTRAST TECHNIQUE: Multidetector CT imaging of the head and neck was performed using the standard protocol during bolus administration of intravenous contrast. Multiplanar CT image reconstructions and MIPs were obtained to evaluate the vascular anatomy. Carotid stenosis measurements (when applicable) are obtained utilizing NASCET criteria, using the distal internal carotid diameter as the denominator. RADIATION DOSE REDUCTION: This exam was performed according to the departmental dose-optimization program which includes automated exposure control, adjustment of the mA and/or kV according to patient size and/or use of iterative reconstruction technique. CONTRAST:  75mL OMNIPAQUE IOHEXOL 350 MG/ML SOLN COMPARISON:  None Available. FINDINGS: CT HEAD Brain:  No midline shift, ventriculomegaly, mass effect, evidence of mass lesion, intracranial hemorrhage or evidence of cortically based acute infarction. Gray-white matter differentiation within normal limits for age. Cerebral volume probably within normal limits for age. Calvarium and skull base: Intact, negative. Paranasal sinuses: Tympanic cavities, Visualized paranasal sinuses and mastoids are clear. Orbits: Negative visible orbit and scalp soft tissues; small right posterior convexity benign scalp lipoma series 5, image 37. CTA NECK Skeleton: No acute osseous abnormality identified. Fairly age-appropriate cervical spine degeneration. Upper chest: Negative. Other neck: Negative. Aortic arch: 3 vessel arch with no significant atherosclerosis. Right carotid system: Mild tortuosity with no significant plaque or stenosis.  Left carotid system: Similar mild tortuosity with no significant plaque or stenosis. Vertebral arteries: Somewhat diminutive bilateral vertebral arteries, more so on the right. Proximal subclavian arteries appear negative. The left vertebral artery origin is obscured by dense adjacent venous contrast. No significant vertebral artery plaque or stenosis identified the skull base. CTA HEAD Posterior circulation: Diminutive vertebrobasilar system with fetal type bilateral PCA origins. Bilateral PICA origins appear patent. No vertebrobasilar stenosis identified. Patent SCA origins. Bilateral PCA branches are within normal limits. Anterior circulation: Both ICA siphons are patent. No siphon plaque or stenosis. Both posterior communicating artery origins appear normal. Patent carotid termini, MCA and ACA origins. Tortuous bilateral A1 segments. Elongated but normal anterior communicating artery on series 12, image 102. Bilateral ACA branches are within normal limits. Left MCA M1 segment is tortuous, patent to the MCA bifurcation without stenosis. Right MCA M1 and bifurcation are patent without stenosis.  Bilateral MCA branches are within normal limits. Venous sinuses: Patent. Anatomic variants: Fetal type bilateral PCA origins with diminutive vertebrobasilar system. Review of the MIP images confirms the above findings IMPRESSION: 1. CTA is largely normal for age. Arterial tortuosity. And diminutive vertebrobasilar system on the basis of fetal type PCA origins. But no significant arterial atherosclerosis or stenosis in the head or neck. 2. Normal for age CT appearance of the brain. Electronically Signed   By: Odessa Fleming M.D.   On: 12/01/2022 12:21    Procedures .Critical Care  Performed by: Mannie Stabile, PA-C Authorized by: Mannie Stabile, PA-C   Critical care provider statement:    Critical care time (minutes):  33   Critical care was necessary to treat or prevent imminent or life-threatening deterioration of the following conditions:  CNS failure or compromise   Critical care was time spent personally by me on the following activities:  Development of treatment plan with patient or surrogate, discussions with consultants, evaluation of patient's response to treatment, examination of patient, ordering and review of laboratory studies, ordering and review of radiographic studies, ordering and performing treatments and interventions, pulse oximetry, re-evaluation of patient's condition and review of old charts   I assumed direction of critical care for this patient from another provider in my specialty: no     Care discussed with: admitting provider       Medications Ordered in ED Medications  iohexol (OMNIPAQUE) 350 MG/ML injection 75 mL (75 mLs Intravenous Contrast Given 12/01/22 1149)    ED Course/ Medical Decision Making/ A&P                             Medical Decision Making Amount and/or Complexity of Data Reviewed Independent Historian: EMS External Data Reviewed: ECG and notes.    Details: UC note Labs: ordered. Decision-making details documented in ED Course. Radiology:  ordered and independent interpretation performed. Decision-making details documented in ED Course. ECG/medicine tests: ordered and independent interpretation performed. Decision-making details documented in ED Course.  Risk Prescription drug management. Decision regarding hospitalization.   This patient presents to the ED for concern of dizziness, slurred speech, this involves an extensive number of treatment options, and is a complaint that carries with it a high risk of complications and morbidity.  The differential diagnosis includes CVA, hypertensive emergency, electrolyte abnormalities, etc  61 year old male presents to the ED from urgent care due to dizziness and slurred speech.  Dizziness started yesterday morning around 7/8 AM.  No previous CVA.  History of hypertension and  hyperlipidemia and has not been on his medications for numerous months.  Also history of schizophrenia..  Patient found to be hypertensive at urgent care and upon arrival to the ED BP 213/113.  Urgent care found patient to be borderline hypoglycemic and given dextrose with improvement in glucose upon arrival to ED to 181.  Patient also had some ST elevation while at urgent care.  Patient denies any chest pain or shortness of breath. ST elevation here in the ED with improvement. Does not meet STEMI criteria after discussion with Dr. Karene Fry. Possible hypertensive emergency vs. CVA. Will hold off on hypertensive medication to allow for permissive HTN. Stroke labs ordered. Out of TNK window. On exam, patient does has slowed speech with occasional slurring. No facial droop. No pronator drift. 5/5 strength in all 4 extremities. CTA head/neck ordered.  CBC significant for pancytopenia.  CMP significant for hyperglycemia 173.  No anion gap.  Normal renal function.  No major electrolyte derangements.  UA negative for signs of infection.  Troponin normal. EKG sinus bradycardia with nonspecific ST abnormalities. Low suspicion for ACS.  UA negative for sings of infection.  Informed by RN that patient was unable to tolerate MRI.  Discussed with patient at bedside.  Patient declined MRI.  Offered Ativan however, patient still declined.  Patient made aware that he could be having a CVA which could worsen and be life-threatening.  Patient states understanding and still refused MRI.  Reassessed patient at bedside, patient's slurred speech has resolved. BP improved to 160s. Possible hypertensive emergency giving improvement in symptoms. Patients still declined MRI. Will consult hospitalist for admission for admission for CVA work-up and BP control.   Discussed with Dr. Iver Nestle with neurology who will see patient.   Discussed with [facility] who agrees to admit patient.   No PCP Hx HTN, hyperlipidemia, and schizophrenia  Discussed with Dr. Karene Fry who agrees with assessment and plan.        Final Clinical Impression(s) / ED Diagnoses Final diagnoses:  Dizziness  Slurred speech    Rx / DC Orders ED Discharge Orders     None         Jesusita Oka 12/01/22 1604    Ernie Avena, MD 12/01/22 1709

## 2022-12-01 NOTE — ED Notes (Signed)
[facility] was called and currently in facility to transport Patient.

## 2022-12-01 NOTE — ED Notes (Signed)
Patient is being discharged from the Urgent Care and sent to the Emergency Department via [facility] . Per E. Raspet, PA, patient is in need of higher level of care due to HTN, dizziness, hypoxia, slurred speech. Patient is aware and verbalizes understanding of plan of care.  Vitals:   12/01/22 1008 12/01/22 1010  BP:    Pulse: (!) 55   Resp: 20   Temp:    SpO2: (!) 79% 94%

## 2022-12-01 NOTE — ED Notes (Signed)
Patient left hooked up to the pulse ox. Oxygen went from 92% to 79% while seated. Patient placed on 2L nasal canula and provider notified. Oxygen back up to 94%.

## 2022-12-01 NOTE — Consult Note (Signed)
Neurology Consultation  Reason for Consult: Dizziness, slurred speech Referring Physician: Dr. Alinda Money  CC: Dizziness, or speech  History is obtained from: Patient  HPI: Jerry Carter is a 62 y.o. male past medical history of schizophrenia, bipolar disorder, hypertension not compliant to antihypertensives presented to the emergency room for evaluation of dizziness and an episode of slurred speech with last known well sometime yesterday-unable to give me a clear last known well. Reports he was at work at [employer], had sudden onset of dizziness where he felt he was getting lightheaded and also room was spinning.  He also had trouble with his speech-reports slurred speech.  There was no trouble finding words or expressing himself. Symptoms of slurred speech have resolved but dizziness did not resolve making him come to the emergency room for further evaluation.  Blood pressure is noted to be in systolic 220s. He was outside the window for any sort of intervention at this time  Denies double vision or blurred vision.  Also noted to have oxygen saturations in the 79% and with 2 L oxygen up to 94%.  No prior history of COPD or heart failure.  Denies current chest pain or headache.  LKW: Greater than 24 hours ago-unable to provide a clear last known well IV thrombolysis given?: no, outside the window EVT: Outside the Premorbid modified Rankin scale (mRS): 0   ROS: Full ROS was performed and is negative except as noted in the HPI.   Past Medical History:  Diagnosis Date   Bipolar disorder (HCC)    Hypertension    Schizophrenia (HCC)      History reviewed. No pertinent family history.   Social History:   reports that he has quit smoking. His smoking use included cigarettes. He has never used smokeless tobacco. He reports that he does not drink alcohol and does not use drugs.  Medications  Current Facility-Administered Medications:    [START ON 12/02/2022]  stroke: early stages of  recovery book, , Does not apply, Once, Synetta Fail, MD   acetaminophen (TYLENOL) tablet 650 mg, 650 mg, Oral, Q4H PRN **OR** acetaminophen (TYLENOL) 160 MG/5ML solution 650 mg, 650 mg, Per Tube, Q4H PRN **OR** acetaminophen (TYLENOL) suppository 650 mg, 650 mg, Rectal, Q4H PRN, Synetta Fail, MD   ARIPiprazole ER (ABILIFY MAINTENA) 400 MG prefilled syringe 400 mg, 400 mg, Intramuscular, Q28 days, Toy Cookey E, NP, 400 mg at 10/29/22 1615   [START ON 12/02/2022] atorvastatin (LIPITOR) tablet 40 mg, 40 mg, Oral, Daily, Synetta Fail, MD   enoxaparin (LOVENOX) injection 40 mg, 40 mg, Subcutaneous, Q24H, Synetta Fail, MD   senna-docusate (Senokot-S) tablet 1 tablet, 1 tablet, Oral, QHS PRN, Synetta Fail, MD  Current Outpatient Medications:    ARIPiprazole ER (ABILIFY MAINTENA) 400 MG PRSY prefilled syringe, Inject 400 mg into the muscle every 30 (thirty) days., Disp: 1 each, Rfl: 11   atorvastatin (LIPITOR) 40 MG tablet, TAKE 1 TABLET(40 MG) BY MOUTH DAILY, Disp: 30 tablet, Rfl: 0   losartan (COZAAR) 25 MG tablet, Take 1 tablet (25 mg total) by mouth daily., Disp: 30 tablet, Rfl: 0   Exam: Current vital signs: BP (!) 195/92   Pulse 69   Temp 97.8 F (36.6 C) (Oral)   Resp (!) 21   Ht 5\' 10"  (1.778 m)   Wt 74.8 kg   SpO2 100%   BMI 23.68 kg/m  Vital signs in last 24 hours: Temp:  [97.4 F (36.3 C)-97.8 F (36.6 C)] 97.8 F (36.6  C) (05/14 1518) Pulse Rate:  [40-69] 69 (05/14 1700) Resp:  [13-25] 21 (05/14 1700) BP: (159-221)/(84-113) 195/92 (05/14 1700) SpO2:  [79 %-100 %] 100 % (05/14 1700) Weight:  [74.8 kg] 74.8 kg (05/14 1056) General: Awake alert in no distress HEENT: Normocephalic atraumatic Lungs: Clear Cardiovascular: Regular rhythm Abdomen nondistended nontender Neurological exam Awake alert oriented x 3 No dysarthria No aphasia Cranial nerves II to XII intact.  No nystagmus Motor exam with no drift Sensation intact to light  touch Coordination exam with no dysmetria NIH stroke scale-0    Labs I have reviewed labs in epic and the results pertinent to this consultation are:  CBC    Component Value Date/Time   WBC 3.1 (L) 12/01/2022 1100   RBC 4.71 12/01/2022 1100   HGB 13.6 12/01/2022 1121   HGB 12.6 (L) 11/26/2022 0000   HCT 40.0 12/01/2022 1121   HCT 39.1 11/26/2022 0000   PLT 141 (L) 12/01/2022 1100   PLT 142 (L) 11/26/2022 0000   MCV 87.9 12/01/2022 1100   MCV 84 11/26/2022 0000   MCH 26.5 12/01/2022 1100   MCHC 30.2 12/01/2022 1100   RDW 14.1 12/01/2022 1100   RDW 13.2 11/26/2022 0000   LYMPHSABS 1.0 12/01/2022 1100   LYMPHSABS 1.2 11/26/2022 0000   MONOABS 0.3 12/01/2022 1100   EOSABS 0.1 12/01/2022 1100   EOSABS 0.0 11/26/2022 0000   BASOSABS 0.0 12/01/2022 1100   BASOSABS 0.0 11/26/2022 0000    CMP     Component Value Date/Time   NA 141 12/01/2022 1121   NA 139 11/26/2022 0000   K 4.0 12/01/2022 1121   CL 104 12/01/2022 1121   CO2 25 12/01/2022 1100   GLUCOSE 171 (H) 12/01/2022 1121   BUN 17 12/01/2022 1121   BUN 9 11/26/2022 0000   CREATININE 1.10 12/01/2022 1121   CALCIUM 9.0 12/01/2022 1100   PROT 6.4 (L) 12/01/2022 1100   PROT 6.5 11/26/2022 0000   ALBUMIN 3.3 (L) 12/01/2022 1100   ALBUMIN 4.0 11/26/2022 0000   AST 26 12/01/2022 1100   ALT 24 12/01/2022 1100   ALKPHOS 60 12/01/2022 1100   BILITOT 0.5 12/01/2022 1100   BILITOT 0.5 11/26/2022 0000   GFRNONAA >60 12/01/2022 1100   GFRAA 89 12/23/2017 1444    Lipid Panel     Component Value Date/Time   CHOL 143 11/26/2022 0000   TRIG 40 11/26/2022 0000   HDL 53 11/26/2022 0000   CHOLHDL 2.7 11/26/2022 0000   CHOLHDL 4 07/26/2020 1021   VLDL 10.2 07/26/2020 1021   LDLCALC 81 11/26/2022 0000   Imaging I have reviewed the images obtained:  CT-head-no changes CTA head and neck with no LVO.  Tortuosity of the arterial system without any significant stenosis.   Assessment: 62 year old with hypertension and  schizophrenia as well as bipolar disorder noncompliant antihypertensives presenting for evaluation of dizziness and slurred speech of which slurred speech is resolved but dizziness persists for greater than 24 hours. Outside the window for IV or IA intervention. Given the symptoms of slurred speech and dizziness, likely a posterior circulation stroke versus posterior circulation TIA versus hypertensive urgency given the systolic blood pressure were in the 200s on arrival. Would benefit from admission for further risk factor workup.  Impression: Hypertensive urgency versus posterior circulation stroke versus posterior circulation TIA  Recommendations: Admit to hospitalist Frequent neurochecks Telemetry MRI brain without contrast 2D echo A1c Lipid panel PT OT Speech therapy Aspirin 81+ Plavix 75 for 3 weeks followed  by aspirin only High intensity statin Blood pressure parameters: Weight treating this as a posterior circulation TIA versus stroke-until MRI is done, I would recommend permissive hypertension allowing blood pressure to be high as systolic of 220.  Treat only if systolic is greater than 220 on a as needed basis. If the MRI does not show any strokes, then might consider reducing the ceiling of systolic blood pressure to 180 more in line with treatment of hypertensive urgency.  Avoid lowering the blood pressure suddenly. Stroke team to follow Plan was discussed with the patient.  Of note, patient is reluctant to go for an MRI but says he is willing to try if an Ativan injection is given.  If he is unable to tolerate MRI, CT head should be repeated in about 24 hours from the last 1 to see if there is any evidence of evolving infarct.    -- Milon Dikes, MD Neurologist [facility] Pager: [phone]

## 2022-12-01 NOTE — H&P (Signed)
History and Physical   Jerry Carter WUJ:[id number] DOB: Jan 15, 1961 DOA: 12/01/2022  PCP: Pcp, No   Patient coming from: Home, urgent care  Chief Complaint: Dizziness, slurred speech  HPI: Jerry Carter is a 62 y.o. male with medical history significant of schizoaffective disorder, hypertension, hyperlipidemia presenting with dizziness and slurred speech.  Patient reports he had onset of dizziness between 7 and 8 AM yesterday.  Describes it as an off-balance sensation.  Went to urgent care and he was told they are that they noted him having slurred speech and he was sent to the ED for further evaluation.  Patient does note he has not taken his medications for his blood pressure or cholesterol for several months.  He denies fevers, chills, chest pain, shortness of breath, abdominal pain, constipation, diarrhea, nausea, vomiting.  Denies any focal weakness.  ED Course: Vital signs in the ED notable for blood pressure in the 160s to 220s systolic, heart rate in the 50s to 60s.  Lab workup included CMP with glucose 173, protein 604, albumin 3.3.  CBC with mild pancytopenia which is stable with WBC 3.1, hemoglobin 12.5, platelets 141.  PT, PTT, INR within normal limits.  Troponin negative x 2.  Ethanol level negative.  Urinalysis with glucose only.  UDS pending.  No interventions thus far in the ED.  Neurology consulted and will see the patient.  Review of Systems: As per HPI otherwise all other systems reviewed and are negative.  Past Medical History:  Diagnosis Date   Bipolar disorder (HCC)    Hypertension    Schizophrenia (HCC)     History reviewed. No pertinent surgical history.  Social History  reports that he has quit smoking. His smoking use included cigarettes. He has never used smokeless tobacco. He reports that he does not drink alcohol and does not use drugs.  No Known Allergies  History reviewed. No pertinent family history.   Prior to Admission medications   Medication Sig  Start Date End Date Taking? Authorizing Provider  ARIPiprazole ER (ABILIFY MAINTENA) 400 MG PRSY prefilled syringe Inject 400 mg into the muscle every 30 (thirty) days. 11/26/22   Shanna Cisco, NP  atorvastatin (LIPITOR) 40 MG tablet TAKE 1 TABLET(40 MG) BY MOUTH DAILY 01/19/22   Curatolo, Adam, DO  losartan (COZAAR) 25 MG tablet Take 1 tablet (25 mg total) by mouth daily. 01/19/22 02/18/22  Curatolo, Adam, DO  candesartan (ATACAND) 4 MG tablet Take 1 tablet (4 mg total) by mouth daily. 12/09/17 04/16/19  Shon Hale, MD    Physical Exam: Vitals:   12/01/22 1215 12/01/22 1357 12/01/22 1415 12/01/22 1518  BP: (!) 186/101 (!) 161/87 (!) 171/98   Pulse: (!) 50 60 61   Resp: 14 14 (!) 21   Temp:    97.8 F (36.6 C)  TempSrc:    Oral  SpO2: 100% 100% 100%   Weight:      Height:        Physical Exam Constitutional:      General: He is not in acute distress.    Appearance: Normal appearance.  HENT:     Head: Normocephalic and atraumatic.     Mouth/Throat:     Mouth: Mucous membranes are moist.     Pharynx: Oropharynx is clear.  Eyes:     Extraocular Movements: Extraocular movements intact.     Pupils: Pupils are equal, round, and reactive to light.  Cardiovascular:     Rate and Rhythm: Normal rate and regular rhythm.  Pulses: Normal pulses.     Heart sounds: Normal heart sounds.  Pulmonary:     Effort: Pulmonary effort is normal. No respiratory distress.     Breath sounds: Normal breath sounds.  Abdominal:     General: Bowel sounds are normal. There is no distension.     Palpations: Abdomen is soft.     Tenderness: There is no abdominal tenderness.  Musculoskeletal:        General: No swelling or deformity.  Skin:    General: Skin is warm and dry.  Neurological:     Comments: Mental Status: Patient is awake, alert, oriented x3 No signs of aphasia or neglect Cranial Nerves: II: Pupils equal, round, and reactive to light.   III,IV, VI: EOMI without ptosis or  diploplia.  V: Facial sensation is symmetric to light touch. VII: Facial movement is symmetric.  VIII: hearing is intact to voice X: Uvula elevates symmetrically XI: Shoulder shrug is symmetric. XII: tongue is midline without atrophy or fasciculations.  Motor: Good effort thorughout, at Least 5/5 bilateral UE, 5/5 bilateral lower extremitiy  Sensory: Sensation is grossly intact bilateral UEs & LEs Cerebellar: Finger-Nose intact bilalat    Labs on Admission: I have personally reviewed following labs and imaging studies  CBC: Recent Labs  Lab 11/26/22 0000 12/01/22 1100 12/01/22 1121  WBC 3.9 3.1*  --   NEUTROABS 2.5 1.7  --   HGB 12.6* 12.5* 13.6  HCT 39.1 41.4 40.0  MCV 84 87.9  --   PLT 142* 141*  --     Basic Metabolic Panel: Recent Labs  Lab 11/26/22 0000 12/01/22 1100 12/01/22 1121  NA 139 138 141  K 4.2 3.9 4.0  CL 104 105 104  CO2 21 25  --   GLUCOSE 140* 173* 171*  BUN 9 15 17   CREATININE 1.10 1.10 1.10  CALCIUM 8.7 9.0  --     GFR: Estimated Creatinine Clearance: 71 mL/min (by C-G formula based on SCr of 1.1 mg/dL).  Liver Function Tests: Recent Labs  Lab 11/26/22 0000 12/01/22 1100  AST 31 26  ALT 30 24  ALKPHOS 61 60  BILITOT 0.5 0.5  PROT 6.5 6.4*  ALBUMIN 4.0 3.3*    Urine analysis:    Component Value Date/Time   COLORURINE STRAW (A) 12/01/2022 1100   APPEARANCEUR CLEAR 12/01/2022 1100   LABSPEC 1.011 12/01/2022 1100   PHURINE 6.0 12/01/2022 1100   GLUCOSEU 150 (A) 12/01/2022 1100   HGBUR NEGATIVE 12/01/2022 1100   BILIRUBINUR NEGATIVE 12/01/2022 1100   KETONESUR NEGATIVE 12/01/2022 1100   PROTEINUR NEGATIVE 12/01/2022 1100   NITRITE NEGATIVE 12/01/2022 1100   LEUKOCYTESUR NEGATIVE 12/01/2022 1100    Radiological Exams on Admission: CT ANGIO HEAD NECK W WO CM  Result Date: 12/01/2022 CLINICAL DATA:  62 year old male with dizziness, slurred speech onset this morning. EXAM: CT ANGIOGRAPHY HEAD AND NECK WITH AND WITHOUT CONTRAST  TECHNIQUE: Multidetector CT imaging of the head and neck was performed using the standard protocol during bolus administration of intravenous contrast. Multiplanar CT image reconstructions and MIPs were obtained to evaluate the vascular anatomy. Carotid stenosis measurements (when applicable) are obtained utilizing NASCET criteria, using the distal internal carotid diameter as the denominator. RADIATION DOSE REDUCTION: This exam was performed according to the departmental dose-optimization program which includes automated exposure control, adjustment of the mA and/or kV according to patient size and/or use of iterative reconstruction technique. CONTRAST:  75mL OMNIPAQUE IOHEXOL 350 MG/ML SOLN COMPARISON:  None Available. FINDINGS: CT  HEAD Brain: No midline shift, ventriculomegaly, mass effect, evidence of mass lesion, intracranial hemorrhage or evidence of cortically based acute infarction. Gray-white matter differentiation within normal limits for age. Cerebral volume probably within normal limits for age. Calvarium and skull base: Intact, negative. Paranasal sinuses: Tympanic cavities, Visualized paranasal sinuses and mastoids are clear. Orbits: Negative visible orbit and scalp soft tissues; small right posterior convexity benign scalp lipoma series 5, image 37. CTA NECK Skeleton: No acute osseous abnormality identified. Fairly age-appropriate cervical spine degeneration. Upper chest: Negative. Other neck: Negative. Aortic arch: 3 vessel arch with no significant atherosclerosis. Right carotid system: Mild tortuosity with no significant plaque or stenosis. Left carotid system: Similar mild tortuosity with no significant plaque or stenosis. Vertebral arteries: Somewhat diminutive bilateral vertebral arteries, more so on the right. Proximal subclavian arteries appear negative. The left vertebral artery origin is obscured by dense adjacent venous contrast. No significant vertebral artery plaque or stenosis identified  the skull base. CTA HEAD Posterior circulation: Diminutive vertebrobasilar system with fetal type bilateral PCA origins. Bilateral PICA origins appear patent. No vertebrobasilar stenosis identified. Patent SCA origins. Bilateral PCA branches are within normal limits. Anterior circulation: Both ICA siphons are patent. No siphon plaque or stenosis. Both posterior communicating artery origins appear normal. Patent carotid termini, MCA and ACA origins. Tortuous bilateral A1 segments. Elongated but normal anterior communicating artery on series 12, image 102. Bilateral ACA branches are within normal limits. Left MCA M1 segment is tortuous, patent to the MCA bifurcation without stenosis. Right MCA M1 and bifurcation are patent without stenosis. Bilateral MCA branches are within normal limits. Venous sinuses: Patent. Anatomic variants: Fetal type bilateral PCA origins with diminutive vertebrobasilar system. Review of the MIP images confirms the above findings IMPRESSION: 1. CTA is largely normal for age. Arterial tortuosity. And diminutive vertebrobasilar system on the basis of fetal type PCA origins. But no significant arterial atherosclerosis or stenosis in the head or neck. 2. Normal for age CT appearance of the brain. Electronically Signed   By: Odessa Fleming M.D.   On: 12/01/2022 12:21    EKG: Independently reviewed.  Sinus bradycardia 54 bpm.  PAC noted.  RSR prime in V1 and V2.  Nonspecific T wave flattening and mild inversion.  Some inversions are similar to previous from 2015.  Assessment/Plan Principal Problem:   Acute focal neurological deficit Active Problems:   Schizoaffective disorder (HCC)   Hypertension   Hyperlipidemia   Acute focal neurologic deficit > Presenting with dizziness and slurred speech. > Symptoms have resolved in the ED, concern for posterior circulation etiology. > Concerning for TIA.  CTA head and neck showed no acute normality did show diminutive vertebrobasilar system.  Patient  refused MRI brain due to fear of tight spaces and refused despite being offered Ativan. > I further discussed need for MRI as it is preferred/best test to evaluate for possible TIA vs CVA.  Patient remains resistant to MRI. > Neurology consulted in ED. - Appreciate neurology recommendations - Allow for permissive HTN  (systolic < 220 and diastolic < 120), pending neurology recommendations may work to slowly normalize.   - Consider ASA  - Resume home statin - Echocardiogram  - Repeat CT head given MRI has been refused - A1C  - Lipid panel  - Tele monitoring  - SLP eval - PT/OT  Hypertension - Has not been taking home losartan - Permissive hypertension for now  Hyperlipidemia - Resume home atorvastatin  Schizoaffective disorder - Receives IM Abilify  DVT prophylaxis: Lovenox  Code Status:   Full Family Communication:  None on admission  Disposition Plan:   Patient is from:  Home  Anticipated DC to:  Home  Anticipated DC date:  1 to 2 days  Anticipated DC barriers: None  Consults called:  Neurology Admission status:  Duration, telemetry  Severity of Illness: The appropriate patient status for this patient is OBSERVATION. Observation status is judged to be reasonable and necessary in order to provide the required intensity of service to ensure the patient's safety. The patient's presenting symptoms, physical exam findings, and initial radiographic and laboratory data in the context of their medical condition is felt to place them at decreased risk for further clinical deterioration. Furthermore, it is anticipated that the patient will be medically stable for discharge from the hospital within 2 midnights of admission.    Synetta Fail MD [facility]  How to contact the [facility] Attending or Consulting provider 7A - 7P or covering provider during after hours 7P -7A, for this patient?   Check the care team in [facility] and look for a) attending/consulting [facility] provider listed  and b) the [facility] team listed Log into [email or web address] and use [facility] universal password to access. If you do not have the password, please contact the hospital operator. Locate the [facility] provider you are looking for under [facility] and page to a number that you can be directly reached. If you still have difficulty reaching the provider, please page the [facility] (Director on Call) for the Hospitalists listed on amion for assistance.  12/01/2022, 4:07 PM

## 2022-12-01 NOTE — ED Triage Notes (Signed)
Dizziness onset this morning, hypertension ongoing. Patient having slurred speech onset this morning. No confusion, no drinking. No LOC or blurred vision. No weakness or fatigue.   Patient is out of his blood pressure medications.

## 2022-12-01 NOTE — ED Notes (Signed)
Attempt to call report to [facility] ED Charge RN without success.

## 2022-12-01 NOTE — Progress Notes (Signed)
  Echocardiogram 2D Echocardiogram has been performed.  Jerry Carter 12/01/2022, 5:36 PM

## 2022-12-01 NOTE — Plan of Care (Signed)
  Problem: Education: Goal: Knowledge of disease or condition will improve Outcome: Progressing   Problem: Ischemic Stroke/TIA Tissue Perfusion: Goal: Complications of ischemic stroke/TIA will be minimized Outcome: Progressing   Problem: Health Behavior/Discharge Planning: Goal: Goals will be collaboratively established with patient/family Outcome: Progressing   Problem: Self-Care: Goal: Ability to participate in self-care as condition permits will improve Outcome: Progressing

## 2022-12-01 NOTE — ED Notes (Signed)
ED TO INPATIENT HANDOFF REPORT  ED Nurse Name and Phone #: [phone] [phone]  S Name/Age/Gender Jerry Carter 62 y.o. male Room/Bed: 018C/018C  Code Status   Code Status: Full Code  Home/SNF/Other Home Patient oriented to: self, place, time, and situation Is this baseline? Yes   Triage Complete: Triage complete  Chief Complaint Acute focal neurological deficit [R29.818]  Triage Note No notes on file   Allergies No Known Allergies  Level of Care/Admitting Diagnosis ED Disposition     ED Disposition  Admit   Condition  --   Comment  Hospital Area: [facility] [[id number]]  Level of Care: Telemetry Medical [104]  May place patient in observation at [facility] or [facility] if equivalent level of care is available:: No  Covid Evaluation: Asymptomatic - no recent exposure (last 10 days) testing not required  Diagnosis: Acute focal neurological deficit [730320]  Admitting Physician: Synetta Fail [[id number]]  Attending Physician: Synetta Fail [[id number]]          B Medical/Surgery History Past Medical History:  Diagnosis Date   Bipolar disorder (HCC)    Hypertension    Schizophrenia (HCC)    History reviewed. No pertinent surgical history.   A IV Location/Drains/Wounds Patient Lines/Drains/Airways Status     Active Line/Drains/Airways     Name Placement date Placement time Site Days   Peripheral IV 12/01/22 18 G Anterior;Distal;Left;Upper Arm 12/01/22  1022  Arm  less than 1            Intake/Output Last 24 hours No intake or output data in the 24 hours ending 12/01/22 1711  Labs/Imaging Results for orders placed or performed during the hospital encounter of 12/01/22 (from the past 48 hour(s))  CBG monitoring, ED     Status: Abnormal   Collection Time: 12/01/22 10:53 AM  Result Value Ref Range   Glucose-Capillary 181 (H) 70 - 99 mg/dL    Comment: Glucose reference range applies only to samples taken after fasting for at least  8 hours.  Ethanol     Status: None   Collection Time: 12/01/22 11:00 AM  Result Value Ref Range   Alcohol, Ethyl (B) <10 <10 mg/dL    Comment: (NOTE) Lowest detectable limit for serum alcohol is 10 mg/dL.  For medical purposes only. Performed at [facility], [address], [place], [place] [address]   Protime-INR     Status: None   Collection Time: 12/01/22 11:00 AM  Result Value Ref Range   Prothrombin Time 14.0 11.4 - 15.2 seconds   INR 1.1 0.8 - 1.2    Comment: (NOTE) INR goal varies based on device and disease states. Performed at [facility], [address], [place], [place] [address]   APTT     Status: None   Collection Time: 12/01/22 11:00 AM  Result Value Ref Range   aPTT 27 24 - 36 seconds    Comment: Performed at [facility], [address] [place], [place] [address]  CBC     Status: Abnormal   Collection Time: 12/01/22 11:00 AM  Result Value Ref Range   WBC 3.1 (L) 4.0 - 10.5 K/uL   RBC 4.71 4.22 - 5.81 MIL/uL   Hemoglobin 12.5 (L) 13.0 - 17.0 g/dL   HCT 40.3 47.4 - 25.9 %   MCV 87.9 80.0 - 100.0 fL   MCH 26.5 26.0 - 34.0 pg   MCHC 30.2 30.0 - 36.0 g/dL   RDW  14.1 11.5 - 15.5 %   Platelets 141 (L) 150 - 400 K/uL   nRBC 0.0 0.0 - 0.2 %    Comment: Performed at [facility], [address], [place], [place] [address]  Differential     Status: None   Collection Time: 12/01/22 11:00 AM  Result Value Ref Range   Neutrophils Relative % 57 %   Neutro Abs 1.7 1.7 - 7.7 K/uL   Lymphocytes Relative 31 %   Lymphs Abs 1.0 0.7 - 4.0 K/uL   Monocytes Relative 9 %   Monocytes Absolute 0.3 0.1 - 1.0 K/uL   Eosinophils Relative 3 %   Eosinophils Absolute 0.1 0.0 - 0.5 K/uL   Basophils Relative 0 %   Basophils Absolute 0.0 0.0 - 0.1 K/uL   Immature Granulocytes 0 %   Abs Immature Granulocytes 0.00 0.00 - 0.07 K/uL    Comment: Performed at [facility], [address] [place], [place] [address]  Comprehensive metabolic panel      Status: Abnormal   Collection Time: 12/01/22 11:00 AM  Result Value Ref Range   Sodium 138 135 - 145 mmol/L   Potassium 3.9 3.5 - 5.1 mmol/L   Chloride 105 98 - 111 mmol/L   CO2 25 22 - 32 mmol/L   Glucose, Bld 173 (H) 70 - 99 mg/dL    Comment: Glucose reference range applies only to samples taken after fasting for at least 8 hours.   BUN 15 8 - 23 mg/dL   Creatinine, Ser 0.98 0.61 - 1.24 mg/dL   Calcium 9.0 8.9 - 11.9 mg/dL   Total Protein 6.4 (L) 6.5 - 8.1 g/dL   Albumin 3.3 (L) 3.5 - 5.0 g/dL   AST 26 15 - 41 U/L   ALT 24 0 - 44 U/L   Alkaline Phosphatase 60 38 - 126 U/L   Total Bilirubin 0.5 0.3 - 1.2 mg/dL   GFR, Estimated >14 >78 mL/min    Comment: (NOTE) Calculated using the CKD-EPI Creatinine Equation (2021)    Anion gap 8 5 - 15    Comment: Performed at [facility], [address] [place], [place] [address]  Urinalysis, Routine w reflex microscopic -Urine, Clean Catch     Status: Abnormal   Collection Time: 12/01/22 11:00 AM  Result Value Ref Range   Color, Urine STRAW (A) YELLOW   APPearance CLEAR CLEAR   Specific Gravity, Urine 1.011 1.005 - 1.030   pH 6.0 5.0 - 8.0   Glucose, UA 150 (A) NEGATIVE mg/dL   Hgb urine dipstick NEGATIVE NEGATIVE   Bilirubin Urine NEGATIVE NEGATIVE   Ketones, ur NEGATIVE NEGATIVE mg/dL   Protein, ur NEGATIVE NEGATIVE mg/dL   Nitrite NEGATIVE NEGATIVE   Leukocytes,Ua NEGATIVE NEGATIVE    Comment: Performed at [facility], [address], [place], [place] [address]  Troponin I (High Sensitivity)     Status: None   Collection Time: 12/01/22 11:00 AM  Result Value Ref Range   Troponin I (High Sensitivity) 10 <18 ng/L    Comment: (NOTE) Elevated high sensitivity troponin I (hsTnI) values and significant  changes across serial measurements may suggest ACS but many other  chronic and acute conditions are known to elevate hsTnI results.  Refer to the "Links" section for chest pain algorithms and additional   guidance. Performed at [facility], [address], [place], [place] [address]   I-stat chem 8, ED     Status: Abnormal  Collection Time: 12/01/22 11:21 AM  Result Value Ref Range   Sodium 141 135 - 145 mmol/L   Potassium 4.0 3.5 - 5.1 mmol/L   Chloride 104 98 - 111 mmol/L   BUN 17 8 - 23 mg/dL   Creatinine, Ser 9.14 0.61 - 1.24 mg/dL   Glucose, Bld 782 (H) 70 - 99 mg/dL    Comment: Glucose reference range applies only to samples taken after fasting for at least 8 hours.   Calcium, Ion 1.14 (L) 1.15 - 1.40 mmol/L   TCO2 28 22 - 32 mmol/L   Hemoglobin 13.6 13.0 - 17.0 g/dL   HCT 95.6 21.3 - 08.6 %  Troponin I (High Sensitivity)     Status: None   Collection Time: 12/01/22  2:00 PM  Result Value Ref Range   Troponin I (High Sensitivity) 11 <18 ng/L    Comment: (NOTE) Elevated high sensitivity troponin I (hsTnI) values and significant  changes across serial measurements may suggest ACS but many other  chronic and acute conditions are known to elevate hsTnI results.  Refer to the "Links" section for chest pain algorithms and additional  guidance. Performed at [facility], [address], [place], [place] [address]    CT ANGIO HEAD NECK W WO CM  Result Date: 12/01/2022 CLINICAL DATA:  62 year old male with dizziness, slurred speech onset this morning. EXAM: CT ANGIOGRAPHY HEAD AND NECK WITH AND WITHOUT CONTRAST TECHNIQUE: Multidetector CT imaging of the head and neck was performed using the standard protocol during bolus administration of intravenous contrast. Multiplanar CT image reconstructions and MIPs were obtained to evaluate the vascular anatomy. Carotid stenosis measurements (when applicable) are obtained utilizing NASCET criteria, using the distal internal carotid diameter as the denominator. RADIATION DOSE REDUCTION: This exam was performed according to the departmental dose-optimization program which includes automated exposure control, adjustment of the mA  and/or kV according to patient size and/or use of iterative reconstruction technique. CONTRAST:  75mL OMNIPAQUE IOHEXOL 350 MG/ML SOLN COMPARISON:  None Available. FINDINGS: CT HEAD Brain: No midline shift, ventriculomegaly, mass effect, evidence of mass lesion, intracranial hemorrhage or evidence of cortically based acute infarction. Gray-white matter differentiation within normal limits for age. Cerebral volume probably within normal limits for age. Calvarium and skull base: Intact, negative. Paranasal sinuses: Tympanic cavities, Visualized paranasal sinuses and mastoids are clear. Orbits: Negative visible orbit and scalp soft tissues; small right posterior convexity benign scalp lipoma series 5, image 37. CTA NECK Skeleton: No acute osseous abnormality identified. Fairly age-appropriate cervical spine degeneration. Upper chest: Negative. Other neck: Negative. Aortic arch: 3 vessel arch with no significant atherosclerosis. Right carotid system: Mild tortuosity with no significant plaque or stenosis. Left carotid system: Similar mild tortuosity with no significant plaque or stenosis. Vertebral arteries: Somewhat diminutive bilateral vertebral arteries, more so on the right. Proximal subclavian arteries appear negative. The left vertebral artery origin is obscured by dense adjacent venous contrast. No significant vertebral artery plaque or stenosis identified the skull base. CTA HEAD Posterior circulation: Diminutive vertebrobasilar system with fetal type bilateral PCA origins. Bilateral PICA origins appear patent. No vertebrobasilar stenosis identified. Patent SCA origins. Bilateral PCA branches are within normal limits. Anterior circulation: Both ICA siphons are patent. No siphon plaque or stenosis. Both posterior communicating artery origins appear normal. Patent carotid termini, MCA and ACA origins. Tortuous bilateral A1 segments. Elongated but normal anterior communicating artery on series 12, image 102.  Bilateral ACA branches are within normal limits. Left MCA M1 segment is tortuous, patent  to the MCA bifurcation without stenosis. Right MCA M1 and bifurcation are patent without stenosis. Bilateral MCA branches are within normal limits. Venous sinuses: Patent. Anatomic variants: Fetal type bilateral PCA origins with diminutive vertebrobasilar system. Review of the MIP images confirms the above findings IMPRESSION: 1. CTA is largely normal for age. Arterial tortuosity. And diminutive vertebrobasilar system on the basis of fetal type PCA origins. But no significant arterial atherosclerosis or stenosis in the head or neck. 2. Normal for age CT appearance of the brain. Electronically Signed   By: Odessa Fleming M.D.   On: 12/01/2022 12:21    Pending Labs Unresulted Labs (From admission, onward)     Start     Ordered   12/08/22 0500  Creatinine, serum  (enoxaparin (LOVENOX)    CrCl >/= 30 ml/min)  Weekly,   R     Comments: while on enoxaparin therapy    12/01/22 1606   12/02/22 0500  Lipid panel  (Labs)  Tomorrow morning,   R       Comments: Fasting    12/01/22 1606   12/02/22 0500  Hemoglobin A1c  (Labs)  Tomorrow morning,   R       Comments: To assess prior glycemic control    12/01/22 1606   12/02/22 0500  CBC  Tomorrow morning,   R        12/01/22 1606   12/02/22 0500  Basic metabolic panel  Tomorrow morning,   R        12/01/22 1606   12/01/22 1603  HIV Antibody (routine testing w rflx)  (HIV Antibody (Routine testing w reflex) panel)  Once,   R        12/01/22 1606   12/01/22 1106  Urine rapid drug screen (hosp performed)  Once,   STAT        12/01/22 1106            Vitals/Pain Today's Vitals   12/01/22 1357 12/01/22 1357 12/01/22 1415 12/01/22 1518  BP: (!) 161/87  (!) 171/98   Pulse: 60  61   Resp: 14  (!) 21   Temp:    97.8 F (36.6 C)  TempSrc:    Oral  SpO2: 100%  100%   Weight:      Height:      PainSc:  0-No pain      Isolation Precautions No active  isolations  Medications Medications  atorvastatin (LIPITOR) tablet 40 mg (has no administration in time range)   stroke: early stages of recovery book (has no administration in time range)  acetaminophen (TYLENOL) tablet 650 mg (has no administration in time range)    Or  acetaminophen (TYLENOL) 160 MG/5ML solution 650 mg (has no administration in time range)    Or  acetaminophen (TYLENOL) suppository 650 mg (has no administration in time range)  senna-docusate (Senokot-S) tablet 1 tablet (has no administration in time range)  enoxaparin (LOVENOX) injection 40 mg (has no administration in time range)  iohexol (OMNIPAQUE) 350 MG/ML injection 75 mL (75 mLs Intravenous Contrast Given 12/01/22 1149)    Mobility walks with person assist     Focused Assessments Neuro Assessment Handoff:  Swallow screen pass? Yes    NIH Stroke Scale  Dizziness Present: Yes Headache Present: No Interval: Initial Level of Consciousness (1a.)   : Alert, keenly responsive LOC Questions (1b. )   : Answers both questions correctly LOC Commands (1c. )   : Performs both tasks correctly Best Gaze (2. )  :  Normal Visual (3. )  : No visual loss Facial Palsy (4. )    : Normal symmetrical movements Motor Arm, Left (5a. )   : No drift Motor Arm, Right (5b. ) : No drift Motor Leg, Left (6a. )  : No drift Motor Leg, Right (6b. ) : No drift Limb Ataxia (7. ): Absent Sensory (8. )  : Normal, no sensory loss Best Language (9. )  : No aphasia Dysarthria (10. ): Mild-to-moderate dysarthria, patient slurs at least some words and, at worst, can be understood with some difficulty Extinction/Inattention (11.)   : No Abnormality Complete NIHSS TOTAL: 1     Neuro Assessment: Exceptions to WDL Neuro Checks:   Initial (12/01/22 1101)  Has TPA been given? No If patient is a Neuro Trauma and patient is going to OR before floor call report to 4N Charge nurse: [phone] or [phone]   R Recommendations: See  Admitting Provider Note  Report given to:   Additional Notes: patient refused MRI. Patient arrived with c/o dizziness that onset yesterday, and slurred speech and word searching that onset today. SBP 239 on arrival. Speech impairment has resolved.

## 2022-12-01 NOTE — ED Notes (Signed)
Patient refused MRI. States the space is too small, and he feels like he cannot breath. Offered patient medication to help him relax. Patient continued to refuse exam. Returned patient to room. EDP notified.

## 2022-12-01 NOTE — ED Provider Notes (Signed)
[facility]    CSN: [id number] Arrival date & time: 12/01/22  0947      History   Chief Complaint Chief Complaint  Patient presents with   Dizziness   Hypertension    HPI Jerry Carter is a 62 y.o. male.   Patient presents today with a several hour history of dizziness.  He has a hard time describing the dizziness but states it is primarily a room spinning sensation.  This started occurring yesterday but that has become more persistent since he woke up this morning.  He also reports that he has had some slurred speech according to those who are close to him since this morning.  He denies any associated headache, visual disturbance, chest pain, shortness of breath, focal weakness.  He denies any history of previous stroke or cardiovascular disease.  He does have a history of hypertension and hyperlipidemia but is not currently taking any medication for this though has previously been prescribed these medicines.  He does not smoke.  Denies history of diabetes.  He denies any recent medication changes or head trauma.  On intake clinical staff reported that his oxygen saturation fluctuated between 90% and 97%.    Past Medical History:  Diagnosis Date   Bipolar disorder (HCC)    Hypertension    Schizophrenia [facility])     Patient Active Problem List   Diagnosis Date Noted   Hyperlipidemia 03/06/2017   Hypertension    Schizoaffective disorder (HCC) 02/16/2014    History reviewed. No pertinent surgical history.     Home Medications    Prior to Admission medications   Medication Sig Start Date End Date Taking? Authorizing Provider  ARIPiprazole ER (ABILIFY MAINTENA) 400 MG PRSY prefilled syringe Inject 400 mg into the muscle every 30 (thirty) days. 11/26/22  Yes Toy Cookey E, NP  atorvastatin (LIPITOR) 40 MG tablet TAKE 1 TABLET(40 MG) BY MOUTH DAILY 01/19/22   Curatolo, Adam, DO  losartan (COZAAR) 25 MG tablet Take 1 tablet (25 mg total) by mouth daily. 01/19/22  02/18/22  Curatolo, Adam, DO  candesartan (ATACAND) 4 MG tablet Take 1 tablet (4 mg total) by mouth daily. 12/09/17 04/16/19  Shon Hale, MD    Family History History reviewed. No pertinent family history.  Social History Social History   Tobacco Use   Smoking status: Former    Types: Cigarettes   Smokeless tobacco: Never   Tobacco comments:    Quit smoking over 40 years ago -02/06/2022  Vaping Use   Vaping Use: Never used  Substance Use Topics   Alcohol use: No    Alcohol/week: 0.0 standard drinks of alcohol   Drug use: No     Allergies   Patient has no known allergies.   Review of Systems Review of Systems  Constitutional:  Positive for activity change. Negative for appetite change, fatigue and fever.  Eyes:  Negative for photophobia and visual disturbance.  Respiratory:  Negative for cough and shortness of breath.   Cardiovascular:  Negative for chest pain, palpitations and leg swelling.  Gastrointestinal:  Negative for abdominal pain, diarrhea, nausea and vomiting.  Neurological:  Positive for dizziness, speech difficulty and light-headedness. Negative for syncope, facial asymmetry, weakness, numbness and headaches.     Physical Exam Triage Vital Signs ED Triage Vitals  Enc Vitals Group     BP 12/01/22 0955 (!) 221/85     Pulse Rate 12/01/22 0955 (!) 55     Resp 12/01/22 0955 18  Temp 12/01/22 0955 (!) 97.4 F (36.3 C)     Temp Source 12/01/22 0955 Oral     SpO2 12/01/22 0955 97 %     Weight --      Height --      Head Circumference --      Peak Flow --      Pain Score 12/01/22 0953 0     Pain Loc --      Pain Edu? --      Excl. in GC? --    No data found.  Updated Vital Signs BP (!) 221/85 (BP Location: Left Arm)   Pulse (!) 55   Temp (!) 97.4 F (36.3 C) (Oral)   Resp 20   SpO2 94%   Visual Acuity Right Eye Distance:   Left Eye Distance:   Bilateral Distance:    Right Eye Near:   Left Eye Near:    Bilateral Near:      Physical Exam Vitals reviewed.  Constitutional:      General: He is awake.     Appearance: Normal appearance. He is well-developed. He is not ill-appearing.     Comments: Very pleasant male appears stated age in no acute distress sitting comfortably on exam room table  HENT:     Head: Normocephalic and atraumatic.  Eyes:     Extraocular Movements: Extraocular movements intact.     Conjunctiva/sclera: Conjunctivae normal.     Pupils: Pupils are equal, round, and reactive to light.  Cardiovascular:     Rate and Rhythm: Bradycardia present. Rhythm regularly irregular.     Heart sounds: Normal heart sounds, S1 normal and S2 normal. No murmur heard. Pulmonary:     Effort: Pulmonary effort is normal.     Breath sounds: No stridor. Examination of the right-lower field reveals decreased breath sounds. Examination of the left-lower field reveals decreased breath sounds. Decreased breath sounds present. No wheezing, rhonchi or rales.  Musculoskeletal:     Comments: Strength 4/5 bilateral upper and lower extremities  Neurological:     Mental Status: He is alert and oriented to person, place, and time.     Cranial Nerves: Dysarthria present.     Comments: Patient had difficulty answering questions with slight slurred speech.  Psychiatric:        Behavior: Behavior is cooperative.      UC Treatments / Results  Labs (all labs ordered are listed, but only abnormal results are displayed) Labs Reviewed  POCT FASTING CBG [facility] MANUAL ENTRY - Abnormal; Notable for the following components:      Result Value   POCT Glucose ([facility]) 60 (*)    All other components within normal limits    EKG   Radiology No results found.  Procedures Procedures (including critical care time)  Medications Ordered in UC Medications  dextrose 50 % solution 50 mL (50 mLs Intravenous Given 12/01/22 1030)    Initial Impression / Assessment and Plan / UC Course  I have reviewed the triage vital signs and the  nursing notes.  Pertinent labs & imaging results that were available during my care of the patient were reviewed by me and considered in my medical decision making (see chart for details).     Patient was markedly hypertensive and reported new onset neurological symptoms.  Last known normal was yesterday evening.  Discussed that he would need to be transported via [facility] for further evaluation management as we do not have the ability to obtain advanced imaging or stat  labs.  EKG was obtained that did show ST elevation in V2 with biphasic T wave in V4 and PVC.  Unfortunately, I am unable to find the actual tracing of previous EKG to compare.  Glucose was 60 the patient was given dextrose in clinic.  IV was started patient was transported by [facility] to [facility], ER.  Final Clinical Impressions(s) / UC Diagnoses   Final diagnoses:  Hypertensive emergency  Dizziness  Slurred speech  Abnormal EKG  Hypoglycemia   Discharge Instructions   None    ED Prescriptions   None    PDMP not reviewed this encounter.   Jeani Hawking, PA-C 12/01/22 1037

## 2022-12-01 NOTE — ED Notes (Signed)
Patient is being discharged from the Urgent Care and sent to the Emergency Department via [facility] . Per Dorann Ou PA, patient is in need of higher level of care due to Slurred speech, low O2 stat, hypertension. Patient is aware and verbalizes understanding of plan of care.  Vitals:   12/01/22 1008 12/01/22 1010  BP:    Pulse: (!) 55   Resp: 20   Temp:    SpO2: (!) 79% 94%

## 2022-12-02 ENCOUNTER — Other Ambulatory Visit (HOSPITAL_COMMUNITY): Payer: Self-pay

## 2022-12-02 ENCOUNTER — Other Ambulatory Visit: Payer: Self-pay | Admitting: Neurology

## 2022-12-02 DIAGNOSIS — G459 Transient cerebral ischemic attack, unspecified: Secondary | ICD-10-CM

## 2022-12-02 DIAGNOSIS — E785 Hyperlipidemia, unspecified: Secondary | ICD-10-CM | POA: Diagnosis not present

## 2022-12-02 DIAGNOSIS — R29818 Other symptoms and signs involving the nervous system: Secondary | ICD-10-CM | POA: Diagnosis not present

## 2022-12-02 DIAGNOSIS — F250 Schizoaffective disorder, bipolar type: Secondary | ICD-10-CM | POA: Diagnosis not present

## 2022-12-02 LAB — LIPID PANEL
Cholesterol: 150 mg/dL (ref 0–200)
HDL: 46 mg/dL (ref >40)
LDL Cholesterol: 91 mg/dL (ref 0–99)
Total CHOL/HDL Ratio: 3.3 RATIO
Triglycerides: 65 mg/dL (ref <150)
VLDL: 13 mg/dL (ref 0–40)

## 2022-12-02 LAB — CBC WITH DIFFERENTIAL/PLATELET
Abs Immature Granulocytes: 0.01 10*3/uL (ref 0.00–0.07)
Basophils Absolute: 0 10*3/uL (ref 0.0–0.1)
Basophils Relative: 0 %
Eosinophils Absolute: 0.1 10*3/uL (ref 0.0–0.5)
Eosinophils Relative: 1 %
HCT: 38.6 % — ABNORMAL LOW (ref 39.0–52.0)
Hemoglobin: 12.1 g/dL — ABNORMAL LOW (ref 13.0–17.0)
Immature Granulocytes: 0 %
Lymphocytes Relative: 32 %
Lymphs Abs: 1.2 10*3/uL (ref 0.7–4.0)
MCH: 26.4 pg (ref 26.0–34.0)
MCHC: 31.3 g/dL (ref 30.0–36.0)
MCV: 84.3 fL (ref 80.0–100.0)
Monocytes Absolute: 0.3 10*3/uL (ref 0.1–1.0)
Monocytes Relative: 8 %
Neutro Abs: 2.1 10*3/uL (ref 1.7–7.7)
Neutrophils Relative %: 59 %
Platelets: 141 10*3/uL — ABNORMAL LOW (ref 150–400)
RBC: 4.58 MIL/uL (ref 4.22–5.81)
RDW: 14.2 % (ref 11.5–15.5)
WBC: 3.6 10*3/uL — ABNORMAL LOW (ref 4.0–10.5)
nRBC: 0 % (ref 0.0–0.2)

## 2022-12-02 LAB — COMPREHENSIVE METABOLIC PANEL
ALT: 22 U/L (ref 0–44)
AST: 21 U/L (ref 15–41)
Albumin: 3.1 g/dL — ABNORMAL LOW (ref 3.5–5.0)
Alkaline Phosphatase: 51 U/L (ref 38–126)
Anion gap: 6 (ref 5–15)
BUN: 15 mg/dL (ref 8–23)
CO2: 26 mmol/L (ref 22–32)
Calcium: 8.7 mg/dL — ABNORMAL LOW (ref 8.9–10.3)
Chloride: 105 mmol/L (ref 98–111)
Creatinine, Ser: 0.99 mg/dL (ref 0.61–1.24)
GFR, Estimated: >60 mL/min (ref >60)
Glucose, Bld: 98 mg/dL (ref 70–99)
Potassium: 3.6 mmol/L (ref 3.5–5.1)
Sodium: 137 mmol/L (ref 135–145)
Total Bilirubin: 0.6 mg/dL (ref 0.3–1.2)
Total Protein: 6 g/dL — ABNORMAL LOW (ref 6.5–8.1)

## 2022-12-02 LAB — HEMOGLOBIN A1C
Hgb A1c MFr Bld: 5.8 % — ABNORMAL HIGH (ref 4.8–5.6)
Mean Plasma Glucose: 119.76 mg/dL

## 2022-12-02 LAB — MAGNESIUM: Magnesium: 1.8 mg/dL (ref 1.7–2.4)

## 2022-12-02 LAB — PHOSPHORUS: Phosphorus: 4 mg/dL (ref 2.5–4.6)

## 2022-12-02 MED ORDER — LOSARTAN POTASSIUM 50 MG PO TABS
100.0000 mg | ORAL_TABLET | Freq: Every day | ORAL | Status: DC
  Administered 2022-12-02: 100 mg via ORAL
  Filled 2022-12-02: qty 2

## 2022-12-02 MED ORDER — LOSARTAN POTASSIUM 100 MG PO TABS
100.0000 mg | ORAL_TABLET | Freq: Every day | ORAL | 0 refills | Status: DC
  Filled 2022-12-02: qty 30, 30d supply, fill #0

## 2022-12-02 MED ORDER — ASPIRIN 81 MG PO TBEC
81.0000 mg | DELAYED_RELEASE_TABLET | Freq: Every day | ORAL | 2 refills | Status: DC
  Filled 2022-12-02: qty 120, 120d supply, fill #0

## 2022-12-02 MED ORDER — ACETAMINOPHEN 325 MG PO TABS
650.0000 mg | ORAL_TABLET | ORAL | 0 refills | Status: DC | PRN
  Filled 2022-12-02: qty 100, 9d supply, fill #0

## 2022-12-02 MED ORDER — CLOPIDOGREL BISULFATE 75 MG PO TABS
75.0000 mg | ORAL_TABLET | Freq: Every day | ORAL | 0 refills | Status: DC
  Filled 2022-12-02: qty 21, 21d supply, fill #0

## 2022-12-02 MED ORDER — ATORVASTATIN CALCIUM 40 MG PO TABS
40.0000 mg | ORAL_TABLET | Freq: Every day | ORAL | 0 refills | Status: DC
  Filled 2022-12-02: qty 30, 30d supply, fill #0

## 2022-12-02 MED ORDER — SENNOSIDES-DOCUSATE SODIUM 8.6-50 MG PO TABS
1.0000 | ORAL_TABLET | Freq: Every evening | ORAL | 0 refills | Status: DC | PRN
  Filled 2022-12-02: qty 30, 30d supply, fill #0

## 2022-12-02 MED ORDER — HYDRALAZINE HCL 20 MG/ML IJ SOLN: 10.0000 mg | Freq: Four times a day (QID) | INTRAMUSCULAR | Status: DC | PRN

## 2022-12-02 NOTE — Evaluation (Addendum)
Physical Therapy Evaluation Patient Details Name: Jerry Carter MRN: [id number] DOB: January 20, 1961 Today's Date: 12/02/2022  History of Present Illness  Pt is a 62 y.o. M who presents with dizziness and slurred speech. Pt notes he has not taken medications for his blood pressure or cholesterol for several months. MRI negative for acute abnormality. Working diagnosis of posterior circulation stroke vs TIA vs HTN urgency. Significant PMH: schizoaffective disorder, bipolar disorder, HTN, HLD.  Clinical Impression  PTA, pt lives alone and works at [employer]. Pt presents with mild deficits in static and dynamic balance in comparison to baseline in addition to mild dizziness during ambulation. No nystagmus noted with smooth pursuits and pt reporting gaze stabilization helped when walking in hallway. Pt ambulating greater than household distances with no assistive device or physical assist. Negotiated 5 steps with railings to simulate home set up. Scoring 21/24 on the DGI, indicating mild impairment, however, is not at high risk for falls. Suspect deficits will improve fairly quickly. Don't anticipate need for PT follow up. Will continue to follow acutely.     Recommendations for follow up therapy are one component of a multi-disciplinary discharge planning process, led by the attending physician.  Recommendations may be updated based on patient status, additional functional criteria and insurance authorization.  Follow Up Recommendations       Assistance Recommended at Discharge Intermittent Supervision/Assistance  Patient can return home with the following  Assist for transportation    Equipment Recommendations None recommended by PT  Recommendations for Other Services       Functional Status Assessment Patient has had a recent decline in their functional status and demonstrates the ability to make significant improvements in function in a reasonable and predictable amount of time.      Precautions / Restrictions Precautions Precautions: None Restrictions Weight Bearing Restrictions: No      Mobility  Bed Mobility Overal bed mobility: Modified Independent                  Transfers Overall transfer level: Independent Equipment used: None                    Ambulation/Gait Ambulation/Gait assistance: Supervision Gait Distance (Feet): 400 Feet Assistive device: None Gait Pattern/deviations: Step-through pattern, Scissoring       General Gait Details: Pt reporting mild dizziness, with intermittent scissoring that improved as distance progressed. Able to perform higher level balance challenges without overt LOB and vary gait speed  Stairs Stairs: Yes Stairs assistance: Modified independent (Device/Increase time) Stair Management: Two rails Number of Stairs: 5    Wheelchair Mobility    Modified Rankin (Stroke Patients Only) Modified Rankin (Stroke Patients Only) Pre-Morbid Rankin Score: No symptoms Modified Rankin: Moderately severe disability     Balance Overall balance assessment: Needs assistance               Single Leg Stance - Right Leg: 10 (on 2nd attempt) Single Leg Stance - Left Leg: 10 Tandem Stance - Right Leg: 10 Tandem Stance - Left Leg: 10 Rhomberg - Eyes Opened: 10 Rhomberg - Eyes Closed: 10 (increased lateral sway)     Standardized Balance Assessment Standardized Balance Assessment : Dynamic Gait Index   Dynamic Gait Index Level Surface: Mild Impairment Change in Gait Speed: Normal Gait with Horizontal Head Turns: Normal Gait with Vertical Head Turns: Normal Gait and Pivot Turn: Normal Step Over Obstacle: Mild Impairment Step Around Obstacles: Normal Steps: Mild Impairment Total Score: 21  Pertinent Vitals/Pain Pain Assessment Pain Assessment: No/denies pain    Home Living Family/patient expects to be discharged to:: Private residence Living Arrangements: Alone   Type of Home:  House Home Access: Stairs to enter Entrance Stairs-Rails: Doctor, general practice of Steps: 3   Home Layout: Able to live on main level with bedroom/bathroom   Additional Comments: Enjoys drawing    Prior Function Prior Level of Function : Independent/Modified Independent             Mobility Comments: 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 or takes bus       Higher education careers adviser   Dominant Hand: Right    Extremity/Trunk Assessment   Upper Extremity Assessment Upper Extremity Assessment: RUE deficits/detail;LUE deficits/detail RUE Deficits / Details: Strength 5/5 LUE Deficits / Details: Strength 5/5    Lower Extremity Assessment Lower Extremity Assessment: RLE deficits/detail;LLE deficits/detail RLE Deficits / Details: Strength 5/5 LLE Deficits / Details: Strength 5/5    Cervical / Trunk Assessment Cervical / Trunk Assessment: Normal  Communication   Communication: No difficulties  Cognition Arousal/Alertness: Awake/alert Behavior During Therapy: Flat affect Overall Cognitive Status: No family/caregiver present to determine baseline cognitive functioning                                 General Comments: Pt overall answers questions appropriately and follows multi step commands. Asks appropriate questions regarding diagnosis and expected recovery timeline. Higher level executive functions not addressed.        General Comments      Exercises     Assessment/Plan    PT Assessment Patient needs continued PT services  PT Problem List Decreased balance       PT Treatment Interventions Gait training;Stair training;Functional mobility training;Therapeutic activities;Therapeutic exercise;Balance training;Patient/family education    PT Goals (Current goals can be found in the Care Plan section)  Acute Rehab PT Goals Patient Stated Goal: return to baseline PT Goal Formulation: All assessment and education complete, DC  therapy    Frequency Min 2X/week     Co-evaluation               AM-PAC PT "6 Clicks" Mobility  Outcome Measure Help needed turning from your back to your side while in a flat bed without using bedrails?: None Help needed moving from lying on your back to sitting on the side of a flat bed without using bedrails?: None Help needed moving to and from a bed to a chair (including a wheelchair)?: None Help needed standing up from a chair using your arms (e.g., wheelchair or bedside chair)?: None Help needed to walk in hospital room?: A Little Help needed climbing 3-5 steps with a railing? : A Little 6 Click Score: 22    End of Session   Activity Tolerance: Patient tolerated treatment well Patient left: in bed;with call bell/phone within reach Nurse Communication: Mobility status PT Visit Diagnosis: Unsteadiness on feet (R26.81)    Time: 1610-9604 PT Time Calculation (min) (ACUTE ONLY): 22 min   Charges:   PT Evaluation $PT Eval Low Complexity: 1 Low          Lillia Pauls, PT, DPT Acute Rehabilitation Services Office [phone]   Norval Morton 12/02/2022, 8:39 AM

## 2022-12-02 NOTE — Evaluation (Signed)
SLP Screen  Note  Patient Details Name: Jerry Carter MRN: [id number] DOB: Aug 20, 1960   Cancelled treatment:       Reason Eval/Treat Not Completed: Other (comment) (pt reports resolution of speech symptoms, reports still a bit off balance, no SLP dedicated evaluation needed at this time; MRI negative, please reorder if indicated)  Rolena Infante, MS [facility] SLP Acute Rehab Services Office [phone]   Chales Abrahams 12/02/2022, 9:03 AM

## 2022-12-02 NOTE — Evaluation (Signed)
Occupational Therapy Evaluation Patient Details Name: Jerry Carter MRN: [id number] DOB: 1961/04/22 Today's Date: 12/02/2022   History of Present Illness Pt is a 62 y.o. M who presents with dizziness and slurred speech. Pt notes he has not taken medications for his blood pressure or cholesterol for several months. MRI negative for acute abnormality. Working diagnosis of posterior circulation stroke vs TIA vs HTN urgency. Significant PMH: schizoaffective disorder, bipolar disorder, HTN, HLD.   Clinical Impression   At baseline, Jerry Carter lives alone, works at [employer] , takes public transportation, follows up with the mental health clinic for "his shots" and has Administrator, arts who assists with finances/bills. He states he stopped taking his BP medicines and knows he needs to start back. Jerry Carter states he feels "just a little off balance". Cognition, ADL and functional mobility most likely close to baseline. Educated on signs/symptoms of CVA - pt verbalized understanding. No further OT needs.      Recommendations for follow up therapy are one component of a multi-disciplinary discharge planning process, led by the attending physician.  Recommendations may be updated based on patient status, additional functional criteria and insurance authorization.   Assistance Recommended at Discharge None  Patient can return home with the following Assist for transportation;Direct supervision/assist for financial management    Functional Status Assessment  Patient has not had a recent decline in their functional status  Equipment Recommendations  None recommended by OT    Recommendations for Other Services       Precautions / Restrictions Precautions Precautions: None Restrictions Weight Bearing Restrictions: No      Mobility Bed Mobility Overal bed mobility: Modified Independent                  Transfers Overall transfer level: Independent Equipment used: None                       Balance Overall balance assessment: Needs assistance   Sitting balance-Leahy Scale: Good       Standing balance-Leahy Scale: Good   Single Leg Stance - Right Leg:  (on 2nd attempt)         Rhomberg - Eyes Closed:  (increased lateral sway)               ADL either performed or assessed with clinical judgement   ADL Overall ADL's : Modified independent                                             Vision Baseline Vision/History: 0 No visual deficits (has an eye appointment soon)       Perception     Praxis      Pertinent Vitals/Pain Pain Assessment Pain Assessment: No/denies pain     Hand Dominance Right   Extremity/Trunk Assessment Upper Extremity Assessment Upper Extremity Assessment: Overall WFL for tasks assessed RUE Deficits / Details: Strength 5/5 LUE Deficits / Details: Strength 5/5   Lower Extremity Assessment Lower Extremity Assessment: Defer to PT evaluation RLE Deficits / Details: Strength 5/5 LLE Deficits / Details: Strength 5/5   Cervical / Trunk Assessment Cervical / Trunk Assessment: Normal   Communication Communication Communication: No difficulties   Cognition Arousal/Alertness: Awake/alert Behavior During Therapy: Impulsive Overall Cognitive Status: No family/caregiver present to determine baseline cognitive functioning (most likely baseline; able ot explain his wrok, drawing adn how  he manages his medications and finanaces; takes public transportation)                                 General Comments: Pt overall answers questions appropriately and follows multi step commands. Asks appropriate questions regarding diagnosis and expected recovery timeline. Higher level executive functions not addressed.     General Comments       Exercises     Shoulder Instructions      Home Living Family/patient expects to be discharged to:: Private residence Living Arrangements: Alone Available  Help at Discharge: Other (Comment) (has a Administrator, arts; follows up at Mental Health) Type of Home: House Home Access: Stairs to enter [employer] of Steps: 3 Entrance Stairs-Rails: Right;Left Home Layout: Able to live on main level with bedroom/bathroom     Bathroom Shower/Tub: Chief Strategy Officer: Standard Bathroom Accessibility: No       Additional Comments: Enjoys drawing      Prior Functioning/Environment Prior Level of Function : Independent/Modified Independent             Mobility Comments: 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 or takes bus          OT Problem List: Impaired balance (sitting and/or standing)      OT Treatment/Interventions: Self-care/ADL training;Therapeutic activities;Patient/family education    OT Goals(Current goals can be found in the care plan section) Acute Rehab OT Goals Patient Stated Goal: to be independently wealthy OT Goal Formulation: All assessment and education complete, DC therapy  OT Frequency: Min 2X/week    Co-evaluation              AM-PAC OT "6 Clicks" Daily Activity     Outcome Measure Help from another person eating meals?: None Help from another person taking care of personal grooming?: None Help from another person toileting, which includes using toliet, bedpan, or urinal?: None Help from another person bathing (including washing, rinsing, drying)?: None Help from another person to put on and taking off regular upper body clothing?: None Help from another person to put on and taking off regular lower body clothing?: None 6 Click Score: 24   End of Session Equipment Utilized During Treatment: Gait belt Nurse Communication: Mobility status  Activity Tolerance: Patient tolerated treatment well Patient left: in bed;with call bell/phone within reach  OT Visit Diagnosis: Unsteadiness on feet (R26.81)                Time: 1030-1048 OT Time Calculation (min):  18 min Charges:  OT General Charges $OT Visit: 1 Visit OT Evaluation $OT Eval Low Complexity: 1 Low  Luisa Dago, OT/L   Acute OT Clinical Specialist Acute Rehabilitation Services Pager [phone] Office [phone]   [facility] 12/02/2022, 10:58 AM

## 2022-12-02 NOTE — Progress Notes (Addendum)
STROKE TEAM PROGRESS NOTE   INTERVAL HISTORY No family at bedside. Presented to ED/5/14 after sudden onset of dizziness at work with slurred speech.  Upon assessment in ED slurred speech had resolved but dizziness remains.  Blood pressure was noted to be in the 220s.  No TNK given due to outside of window.   Patient reports noncompliance with blood pressure and cholesterol medicine, former smoker. Admitted for workup.  MRI negative.  CTA does show fetal PCA with decreased posterior circulation.   On exam today no slurred speech or dizziness noted.  No focal weakness noted.  Patient denies headache, dizziness, nausea/vomiting, chest pain, shortness of breath.  Says he feels "better but still a little off"  Recommend aspirin Plavix for 3 weeks and aspirin alone.  Neurology will sign off with recommendations as stated below.  Vitals:   12/01/22 2356 12/02/22 0400 12/02/22 0812 12/02/22 1105  BP: (!) 140/78 (!) 157/87 (!) 184/99 (!) 191/90  Pulse: (!) 52 (!) 43 (!) 51 60  Resp: 16 15 16 16   Temp: 97.8 F (36.6 C) 97.6 F (36.4 C) 97.8 F (36.6 C) 98 F (36.7 C)  TempSrc: Oral Oral Oral Oral  SpO2: 99% 100% 100% 100%  Weight:      Height:       CBC:  Recent Labs  Lab 12/01/22 1100 12/01/22 1121 12/02/22 1158  WBC 3.1*  --  3.6*  NEUTROABS 1.7  --  2.1  HGB 12.5* 13.6 12.1*  HCT 41.4 40.0 38.6*  MCV 87.9  --  84.3  PLT 141*  --  141*   Basic Metabolic Panel:  Recent Labs  Lab 12/01/22 1100 12/01/22 1121 12/02/22 1158  NA 138 141 137  K 3.9 4.0 3.6  CL 105 104 105  CO2 25  --  26  GLUCOSE 173* 171* 98  BUN 15 17 15   CREATININE 1.10 1.10 0.99  CALCIUM 9.0  --  8.7*  MG  --   --  1.8  PHOS  --   --  4.0   Lipid Panel:  Recent Labs  Lab 11/26/22 0000  CHOL 143  TRIG 40  HDL 53  CHOLHDL 2.7  LDLCALC 81   HgbA1c:  Recent Labs  Lab 12/02/22 1158  HGBA1C 5.8*   Urine Drug Screen:  Recent Labs  Lab 12/01/22 1100  LABOPIA NONE DETECTED  COCAINSCRNUR NONE  DETECTED  LABBENZ NONE DETECTED  AMPHETMU NONE DETECTED  THCU NONE DETECTED  LABBARB NONE DETECTED    Alcohol Level  Recent Labs  Lab 12/01/22 1100  ETH <10    IMAGING past 24 hours MR BRAIN WO CONTRAST  Result Date: 12/01/2022 CLINICAL DATA:  Initial evaluation for neuro deficit, stroke suspected. EXAM: MRI HEAD WITHOUT CONTRAST TECHNIQUE: Multiplanar, multiecho pulse sequences of the brain and surrounding structures were obtained without intravenous contrast. COMPARISON:  CT from earlier the same day. FINDINGS: Brain: Cerebral volume within normal limits. Few scattered subcentimeter foci of T2/FLAIR hyperintensity noted involving the supratentorial cerebral white matter, nonspecific, but overall minimal in nature, and less than is typically seen for patient age. No evidence for acute or subacute ischemia. Gray-white matter differentiation maintained. No areas of chronic cortical infarction. No acute or chronic intracranial blood products. No mass lesion, midline shift or mass effect. No hydrocephalus or extra-axial fluid collection. Pituitary gland and suprasellar region within normal limits. Vascular: Major intracranial vascular flow voids are maintained. Skull and upper cervical spine: Craniocervical junction within normal limits. Bone marrow signal intensity  normal. No scalp soft tissue abnormality. Sinuses/Orbits: Globes and orbital soft tissues within normal limits. Mild scattered mucosal thickening present about the ethmoidal air cells and maxillary sinuses. Trace bilateral mastoid effusions, of doubtful significance. Other: None. IMPRESSION: Normal brain MRI for age. No acute intracranial abnormality identified. Electronically Signed   By: Rise Mu M.D.   On: 12/01/2022 22:30   ECHOCARDIOGRAM COMPLETE  Result Date: 12/01/2022    ECHOCARDIOGRAM REPORT   Patient Name:   Jerry Carter Date of Exam: 12/01/2022 Medical Rec #:  [id number]    Height:       70.0 in Accession #:     [id number]   Weight:       165.0 lb Date of Birth:  [birth year]    BSA:          1.923 m Patient Age:    61 years     BP:           195/92 mmHg Patient Gender: M            HR:           50 bpm. Exam Location:  Inpatient Procedure: 2D Echo, Cardiac Doppler and Color Doppler Indications:    TIA  History:        Patient has no prior history of Echocardiogram examinations.  Sonographer:    Delcie Roch RDCS Referring Phys: [id number] Cecille Po MELVIN IMPRESSIONS  1. Left ventricular ejection fraction, by estimation, is 60 to 65%. The left ventricle has normal function. The left ventricle has no regional wall motion abnormalities. There is mild concentric left ventricular hypertrophy. Left ventricular diastolic parameters are indeterminate.  2. Right ventricular systolic function is normal. The right ventricular size is normal. There is normal pulmonary artery systolic pressure.  3. Left atrial size was mildly dilated.  4. Right atrial size was mildly dilated.  5. The mitral valve is normal in structure. Mild mitral valve regurgitation. No evidence of mitral stenosis.  6. The aortic valve is tricuspid. Aortic valve regurgitation is mild. No aortic stenosis is present.  7. The inferior vena cava is normal in size with greater than 50% respiratory variability, suggesting right atrial pressure of 3 mmHg. FINDINGS  Left Ventricle: Left ventricular ejection fraction, by estimation, is 60 to 65%. The left ventricle has normal function. The left ventricle has no regional wall motion abnormalities. The left ventricular internal cavity size was normal in size. There is  mild concentric left ventricular hypertrophy. Left ventricular diastolic parameters are indeterminate. Right Ventricle: The right ventricular size is normal. No increase in right ventricular wall thickness. Right ventricular systolic function is normal. There is normal pulmonary artery systolic pressure. The tricuspid regurgitant velocity is 1.89 m/s, and   with an assumed right atrial pressure of 3 mmHg, the estimated right ventricular systolic pressure is 17.3 mmHg. Left Atrium: Left atrial size was mildly dilated. Right Atrium: Right atrial size was mildly dilated. Pericardium: There is no evidence of pericardial effusion. Mitral Valve: The mitral valve is normal in structure. Mild mitral valve regurgitation. No evidence of mitral valve stenosis. Tricuspid Valve: The tricuspid valve is normal in structure. Tricuspid valve regurgitation is not demonstrated. No evidence of tricuspid stenosis. Aortic Valve: The aortic valve is tricuspid. Aortic valve regurgitation is mild. No aortic stenosis is present. Pulmonic Valve: The pulmonic valve was normal in structure. Pulmonic valve regurgitation is not visualized. No evidence of pulmonic stenosis. Aorta: The aortic root is normal in size and structure. Venous: The inferior vena  cava is normal in size with greater than 50% respiratory variability, suggesting right atrial pressure of 3 mmHg. IAS/Shunts: No atrial level shunt detected by color flow Doppler.  LEFT VENTRICLE PLAX 2D LVIDd:         4.50 cm   Diastology LVIDs:         2.80 cm   LV e' medial:    6.53 cm/s LV PW:         1.30 cm   LV E/e' medial:  10.3 LV IVS:        1.30 cm   LV e' lateral:   7.07 cm/s LVOT diam:     2.30 cm   LV E/e' lateral: 9.5 LV SV:         96 LV SV Index:   50 LVOT Area:     4.15 cm  RIGHT VENTRICLE             IVC RV Basal diam:  3.20 cm     IVC diam: 2.00 cm RV S prime:     12.40 cm/s TAPSE (M-mode): 2.8 cm LEFT ATRIUM             Index        RIGHT ATRIUM           Index LA diam:        4.10 cm 2.13 cm/m   RA Area:     21.90 cm LA Vol (A2C):   78.1 ml 40.60 ml/m  RA Volume:   66.70 ml  34.68 ml/m LA Vol (A4C):   58.5 ml 30.41 ml/m LA Biplane Vol: 70.2 ml 36.50 ml/m  AORTIC VALVE LVOT Vmax:   109.00 cm/s LVOT Vmean:  63.200 cm/s LVOT VTI:    0.231 m  AORTA Ao Root diam: 3.00 cm Ao Asc diam:  3.30 cm MITRAL VALVE                TRICUSPID VALVE MV Area (PHT): 3.06 cm    TR Peak grad:   14.3 mmHg MV Decel Time: 248 msec    TR Vmax:        189.00 cm/s MV E velocity: 67.50 cm/s MV A velocity: 58.00 cm/s  SHUNTS MV E/A ratio:  1.16        Systemic VTI:  0.23 m                            Systemic Diam: 2.30 cm Kardie Tobb DO Electronically signed by Thomasene Ripple DO Signature Date/Time: 12/01/2022/8:23:15 PM    Final     PHYSICAL EXAM  Temp:  [97.6 F (36.4 C)-98.4 F (36.9 C)] 98 F (36.7 C) (05/15 1105) Pulse Rate:  [43-69] 60 (05/15 1105) Resp:  [14-25] 16 (05/15 1105) BP: (140-195)/(78-103) 191/90 (05/15 1105) SpO2:  [99 %-100 %] 100 % (05/15 1105)  General - Well nourished, well developed, in no apparent distress.  Cardiovascular - Regular rhythm and rate.  Mental Status -  Level of arousal and orientation to time, place, and person were intact. Language including expression, naming, repetition, comprehension was assessed and found intact.  Attention span and concentration decreased, patient does get distracted easily and begins talking about other topics, even when tasked.  Recent and remote memory were intact.  Cranial Nerves II - XII - II - Visual field intact OU. III, IV, VI - Extraocular movements intact. V - Facial sensation intact bilaterally. VII - Facial movement intact bilaterally.  VIII - Hearing bilaterally. X - Palate elevates symmetrically. XI - Chin turning & shoulder shrug intact bilaterally. XII - Tongue protrusion intact.  Motor Strength - The patient's strength was normal in all extremities and pronator drift was absent.  Bulk was normal and fasciculations were absent. No drift present.    Motor Tone - Muscle tone was assessed at the neck and appendages and was normal.  Sensory - Light touch, symmetrical.    Coordination - The patient had normal movements in the hands and feet with no ataxia or dysmetria.  Tremor was absent.  Gait and Station - deferred.   ASSESSMENT/PLAN Mr. Hasnain Meitz is a 62 year old with hypertension and schizophrenia as well as bipolar disorder noncompliant antihypertensives presenting for evaluation of dizziness and slurred speech of which slurred speech is resolved but dizziness persists for greater than 24 hours. Outside the window for IV or IA intervention. MRI negative for stroke.  Given the symptoms of slurred speech and dizziness, likely posterior circulation TIA versus hypertensive urgency given the systolic blood pressure were in the 200s on arrival.   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 MRI: No acute intracranial abnormality identified. 2D Echo: LVEF 60 to 65%, mild concentric LVH, mild MVR, mild AVR, no shunt. LDL 81 HgbA1c 5.8 UDS neg VTE prophylaxis - lovenox No antithrombotic prior to admission, now on aspirin 81 mg daily and clopidogrel 75 mg daily for 3 weeks followed by aspirin alone. Therapy recommendations:  no follow-up Disposition:  pending  Hypertension Home meds: Losartan 25 mg Unstable on the high end Importance of medication compliance discussed with patient. Started cozaar 100 Gradually normalize BP in 2-3 days Long-term BP goal normotensive  Hyperlipidemia Home meds: none LDL 81, goal < 70 Started on lipitor 40 Importance of medication compliance discussed with patient. Continue statin at discharge  Other Stroke Risk Factors Former Cigarette smoker  Other Active Problems History of Bipolar Disorder, Schizophrenia Abilify home med  Hospital day # 0   Neurology will sign off with recommendations as above.  Follow-up with [facility] in 8 weeks.  Pt seen by Neuro NP/APP and later by MD. Note/plan to be edited by MD as needed.    Lynnae January, DNP, AGACNP-BC [facility] Please use  AMION for contact information & EPIC for messaging.  ATTENDING NOTE: I reviewed above note and agree with the assessment and plan. Pt was seen and examined.   Pt sitting in bed for lunch, neuro intact now. Episode concerning for posterior TIA given hypoplastic posterior circulation and uncontrolled risk factors especially uncontrolled HTN. Put on cozaar 100. Continue DAPT for 3 weeks and then ASA alone. Add lipitor 40. Medication compliance education provided. Pt denies smoking alcohol or drugs.   For detailed assessment and plan, please refer to above/below as I have made changes wherever appropriate.   Neurology will sign off. Please call with questions. Pt will follow up with stroke clinic NP at [facility] in about 4 weeks. Thanks for the consult.   Marvel Plan, MD PhD Stroke Neurology 12/02/2022 10:26 PM    To contact Stroke Continuity provider, please refer to [email or web address]. After hours, contact General Neurology

## 2022-12-02 NOTE — Progress Notes (Signed)
(  Late entry) Before pt's discharge, pt was educated on BEFAST and 911 activation. Handout given, questions answered.

## 2022-12-02 NOTE — Discharge Summary (Addendum)
Physician Discharge Summary   Patient: Jerry Carter MRN: [id number] DOB: 1961-07-17  Admit date:     12/01/2022  Discharge date: 12/02/22  Discharge Physician: Marguerita Merles, DO   PCP: Pcp, No   Recommendations at discharge:   Follow-up and establish with PCP within 1 to 2 weeks and repeat CBC, CMP, Mag, Phos within 1 week Follow-up with neurology in outpatient setting  Discharge Diagnoses: Principal Problem:   Acute focal neurological deficit Active Problems:   Schizoaffective disorder (HCC)   Hypertension   Hyperlipidemia  Resolved Problems:   * No resolved hospital problems. *  Hospital Course: Majesty Kis is a 62 y.o. male with medical history significant of schizoaffective disorder, hypertension, hyperlipidemia presenting with dizziness and slurred speech.   Patient reports he had onset of dizziness between 7 and 8 AM yesterday.  Describes it as an off-balance sensation.  Went to urgent care and he was told they are that they noted him having slurred speech and he was sent to the ED for further evaluation.  Patient does note he has not taken his medications for his blood pressure or cholesterol for several months.   He denies fevers, chills, chest pain, shortness of breath, abdominal pain, constipation, diarrhea, nausea, vomiting.  Denies any focal weakness.   ED Course: Vital signs in the ED notable for blood pressure in the 160s to 220s systolic, heart rate in the 50s to 60s.  Lab workup included CMP with glucose 173, protein 604, albumin 3.3.  CBC with mild pancytopenia which is stable with WBC 3.1, hemoglobin 12.5, platelets 141.  PT, PTT, INR within normal limits.  Troponin negative x 2.  Ethanol level negative.  Urinalysis with glucose only.  UDS pending.  No interventions thus far in the ED.  Neurology consulted and will see the patient.  **Interim History Neurology was consulted for sudden onset dizziness with slurred speech and his MRI was negative and CTA showed fetal  PCA with decreased posterior circulation.  His symptoms improved and neurology recommended dual antiplatelet therapy with aspirin 81 mg and clopidogrel 75 mg for 3 weeks and just aspirin alone.  Assessment and Plan: No notes have been filed under this hospital service. Service: Hospitalist  Acute focal neurologic deficit likely TIA > Presenting with dizziness and slurred speech. > Symptoms have resolved in the ED, concern for posterior circulation etiology. > Concerning for TIA.  CTA head and neck showed no acute normality did show diminutive vertebrobasilar system.  Patient refused MRI brain due to fear of tight spaces and refused despite being offered Ativan. > MRI was able to be done and showed no acute intracranial abnormality identified > Neurology consulted in ED. - Appreciate neurology recommendations - Allow for permissive HTN  (systolic < 220 and diastolic < 120), pending neurology recommendations may work to slowly normalize.   - Consider ASA and added Plavix - Resume home statin and was initiated on atorvastatin - Echocardiogram done and showed an EF of 60 to 65% with mild concentric LVH, mild MVR, mild AVR no shunt - A1C was 5.8 - Lipid panel done and showed an LDL of 81 - Tele monitoring  - SLP eval - PT/OT recommending no follow-up -Neurology evaluated and cleared him for discharge and recommended dual antiplatelet therapy for 3 weeks and then just aspirin alone -Neurology recommended following up with [facility] in 8 weeks   Hypertension - Has not been taking home losartan - Permissive hypertension initially but his losartan was resumed and increased to 100  mg daily   Hyperlipidemia - Resume home atorvastatin at 40 mg/h   Schizoaffective disorder - Receives IM Abilify and will need to continue home medications  Normocytic Anemia -Hgb/Hct Trend: Recent Labs  Lab 11/26/22 0000 12/01/22 1100 12/01/22 1100 12/01/22 1121 12/02/22 1158  HGB 12.6* 12.5*  --  13.6 12.1*   HCT 39.1 41.4   < > 40.0 38.6*  MCV 84 87.9   < >  --  84.3   < > = values in this interval not displayed.  -Check Anemia Panel in the outpatient setting -Continue to Monitor for S/Sx of Bleeding; No overt bleeding noted -Repeat CBC within 1 week   Hypoalbuminemia -Patient's Albumin Trend: Recent Labs  Lab 11/26/22 0000 12/01/22 1100 12/02/22 1158  ALBUMIN 4.0 3.3* 3.1*  -Continue to Monitor and Trend and repeat CMP in the AM   Consultants: Neurology Procedures performed: ECHOCARDIOGRAM IMPRESSIONS     1. Left ventricular ejection fraction, by estimation, is 60 to 65%. The  left ventricle has normal function. The left ventricle has no regional  wall motion abnormalities. There is mild concentric left ventricular  hypertrophy. Left ventricular diastolic  parameters are indeterminate.   2. Right ventricular systolic function is normal. The right ventricular  size is normal. There is normal pulmonary artery systolic pressure.   3. Left atrial size was mildly dilated.   4. Right atrial size was mildly dilated.   5. The mitral valve is normal in structure. Mild mitral valve  regurgitation. No evidence of mitral stenosis.   6. The aortic valve is tricuspid. Aortic valve regurgitation is mild. No  aortic stenosis is present.   7. The inferior vena cava is normal in size with greater than 50%  respiratory variability, suggesting right atrial pressure of 3 mmHg.   FINDINGS   Left Ventricle: Left ventricular ejection fraction, by estimation, is 60  to 65%. The left ventricle has normal function. The left ventricle has no  regional wall motion abnormalities. The left ventricular internal cavity  size was normal in size. There is   mild concentric left ventricular hypertrophy. Left ventricular diastolic  parameters are indeterminate.   Right Ventricle: The right ventricular size is normal. No increase in  right ventricular wall thickness. Right ventricular systolic function is   normal. There is normal pulmonary artery systolic pressure. The tricuspid  regurgitant velocity is 1.89 m/s, and   with an assumed right atrial pressure of 3 mmHg, the estimated right  ventricular systolic pressure is 17.3 mmHg.   Left Atrium: Left atrial size was mildly dilated.   Right Atrium: Right atrial size was mildly dilated.   Pericardium: There is no evidence of pericardial effusion.   Mitral Valve: The mitral valve is normal in structure. Mild mitral valve  regurgitation. No evidence of mitral valve stenosis.   Tricuspid Valve: The tricuspid valve is normal in structure. Tricuspid  valve regurgitation is not demonstrated. No evidence of tricuspid  stenosis.   Aortic Valve: The aortic valve is tricuspid. Aortic valve regurgitation is  mild. No aortic stenosis is present.   Pulmonic Valve: The pulmonic valve was normal in structure. Pulmonic valve  regurgitation is not visualized. No evidence of pulmonic stenosis.   Aorta: The aortic root is normal in size and structure.   Venous: The inferior vena cava is normal in size with greater than 50%  respiratory variability, suggesting right atrial pressure of 3 mmHg.   IAS/Shunts: No atrial level shunt detected by color flow Doppler.  LEFT VENTRICLE  PLAX 2D  LVIDd:         4.50 cm   Diastology  LVIDs:         2.80 cm   LV e' medial:    6.53 cm/s  LV PW:         1.30 cm   LV E/e' medial:  10.3  LV IVS:        1.30 cm   LV e' lateral:   7.07 cm/s  LVOT diam:     2.30 cm   LV E/e' lateral: 9.5  LV SV:         96  LV SV Index:   50  LVOT Area:     4.15 cm     RIGHT VENTRICLE             IVC  RV Basal diam:  3.20 cm     IVC diam: 2.00 cm  RV S prime:     12.40 cm/s  TAPSE (M-mode): 2.8 cm   LEFT ATRIUM             Index        RIGHT ATRIUM           Index  LA diam:        4.10 cm 2.13 cm/m   RA Area:     21.90 cm  LA Vol (A2C):   78.1 ml 40.60 ml/m  RA Volume:   66.70 ml  34.68 ml/m  LA Vol (A4C):   58.5 ml  30.41 ml/m  LA Biplane Vol: 70.2 ml 36.50 ml/m   AORTIC VALVE  LVOT Vmax:   109.00 cm/s  LVOT Vmean:  63.200 cm/s  LVOT VTI:    0.231 m    AORTA  Ao Root diam: 3.00 cm  Ao Asc diam:  3.30 cm   MITRAL VALVE               TRICUSPID VALVE  MV Area (PHT): 3.06 cm    TR Peak grad:   14.3 mmHg  MV Decel Time: 248 msec    TR Vmax:        189.00 cm/s  MV E velocity: 67.50 cm/s  MV A velocity: 58.00 cm/s  SHUNTS  MV E/A ratio:  1.16        Systemic VTI:  0.23 m                             Systemic Diam: 2.30 cm    Disposition: Home Diet recommendation:  Cardiac diet DISCHARGE MEDICATION: Allergies as of 12/02/2022   No Known Allergies      Medication List     TAKE these medications    Abilify Maintena 400 MG Prsy prefilled syringe Generic drug: ARIPiprazole ER Inject 400 mg into the muscle every 30 (thirty) days.   acetaminophen 325 MG tablet Commonly known as: TYLENOL Take 2 tablets (650 mg total) by mouth every 4 (four) hours as needed for mild pain (or temp > 37.5 C (99.5 F)).   aspirin EC 81 MG tablet Take 1 tablet (81 mg total) by mouth daily. Swallow whole. Start taking on: Dec 03, 2022   atorvastatin 40 MG tablet Commonly known as: LIPITOR Take 1 tablet (40 mg total) by mouth daily. Start taking on: Dec 03, 2022 What changed:  how much to take how to take this when to take this additional instructions   clopidogrel 75  MG tablet Commonly known as: PLAVIX Take 1 tablet (75 mg total) by mouth daily. Start taking on: Dec 03, 2022   losartan 100 MG tablet Commonly known as: COZAAR Take 1 tablet (100 mg total) by mouth daily. Start taking on: Dec 03, 2022 What changed:  medication strength how much to take   senna-docusate 8.6-50 MG tablet Commonly known as: Senokot-S Take 1 tablet by mouth at bedtime as needed for moderate constipation or mild constipation.       Discharge Exam: Filed Weights   12/01/22 1056  Weight: 74.8 kg   Vitals:    12/02/22 1105 12/02/22 1535  BP: (!) 191/90 (!) 149/88  Pulse: 60 66  Resp: 16 16  Temp: 98 F (36.7 C) 98.2 F (36.8 C)  SpO2: 100% 100%   Examination: Physical Exam:  Constitutional: Thin African-American male in no acute distress Respiratory: Diminished to auscultation bilaterally, no wheezing, rales, rhonchi or crackles. Normal respiratory effort and patient is not tachypenic. No accessory muscle use.  Unlabored breathing Cardiovascular: RRR, no murmurs / rubs / gallops. S1 and S2 auscultated. No extremity edema. 2+ pedal pulses. No carotid bruits.  Abdomen: Soft, non-tender, non-distended. Bowel sounds positive.  GU: Deferred. Musculoskeletal: No clubbing / cyanosis of digits/nails. No joint deformity upper and lower extremities.  Skin: No rashes, lesions, ulcers on limited skin evaluation. No induration; Warm and dry.  Neurologic: CN 2-12 grossly intact with no focal deficits. Romberg sign and cerebellar reflexes not assessed.  Psychiatric: Normal judgment and insight. Alert and oriented x 3. Normal mood and appropriate affect.   Condition at discharge: stable  The results of significant diagnostics from this hospitalization (including imaging, microbiology, ancillary and laboratory) are listed below for reference.   Imaging Studies: MR BRAIN WO CONTRAST  Result Date: 12/01/2022 CLINICAL DATA:  Initial evaluation for neuro deficit, stroke suspected. EXAM: MRI HEAD WITHOUT CONTRAST TECHNIQUE: Multiplanar, multiecho pulse sequences of the brain and surrounding structures were obtained without intravenous contrast. COMPARISON:  CT from earlier the same day. FINDINGS: Brain: Cerebral volume within normal limits. Few scattered subcentimeter foci of T2/FLAIR hyperintensity noted involving the supratentorial cerebral white matter, nonspecific, but overall minimal in nature, and less than is typically seen for patient age. No evidence for acute or subacute ischemia. Gray-white matter  differentiation maintained. No areas of chronic cortical infarction. No acute or chronic intracranial blood products. No mass lesion, midline shift or mass effect. No hydrocephalus or extra-axial fluid collection. Pituitary gland and suprasellar region within normal limits. Vascular: Major intracranial vascular flow voids are maintained. Skull and upper cervical spine: Craniocervical junction within normal limits. Bone marrow signal intensity normal. No scalp soft tissue abnormality. Sinuses/Orbits: Globes and orbital soft tissues within normal limits. Mild scattered mucosal thickening present about the ethmoidal air cells and maxillary sinuses. Trace bilateral mastoid effusions, of doubtful significance. Other: None. IMPRESSION: Normal brain MRI for age. No acute intracranial abnormality identified. Electronically Signed   By: Rise Mu M.D.   On: 12/01/2022 22:30   ECHOCARDIOGRAM COMPLETE  Result Date: 12/01/2022    ECHOCARDIOGRAM REPORT   Patient Name:   DMARCO FILTZ Date of Exam: 12/01/2022 Medical Rec #:  [id number]    Height:       70.0 in Accession #:    [id number]   Weight:       165.0 lb Date of Birth:  10-06-1960    BSA:          1.923 m Patient Age:    72 years  BP:           195/92 mmHg Patient Gender: M            HR:           50 bpm. Exam Location:  Inpatient Procedure: 2D Echo, Cardiac Doppler and Color Doppler Indications:    TIA  History:        Patient has no prior history of Echocardiogram examinations.  Sonographer:    Delcie Roch RDCS Referring Phys: [id number] Cecille Po MELVIN IMPRESSIONS  1. Left ventricular ejection fraction, by estimation, is 60 to 65%. The left ventricle has normal function. The left ventricle has no regional wall motion abnormalities. There is mild concentric left ventricular hypertrophy. Left ventricular diastolic parameters are indeterminate.  2. Right ventricular systolic function is normal. The right ventricular size is normal. There is normal  pulmonary artery systolic pressure.  3. Left atrial size was mildly dilated.  4. Right atrial size was mildly dilated.  5. The mitral valve is normal in structure. Mild mitral valve regurgitation. No evidence of mitral stenosis.  6. The aortic valve is tricuspid. Aortic valve regurgitation is mild. No aortic stenosis is present.  7. The inferior vena cava is normal in size with greater than 50% respiratory variability, suggesting right atrial pressure of 3 mmHg. FINDINGS  Left Ventricle: Left ventricular ejection fraction, by estimation, is 60 to 65%. The left ventricle has normal function. The left ventricle has no regional wall motion abnormalities. The left ventricular internal cavity size was normal in size. There is  mild concentric left ventricular hypertrophy. Left ventricular diastolic parameters are indeterminate. Right Ventricle: The right ventricular size is normal. No increase in right ventricular wall thickness. Right ventricular systolic function is normal. There is normal pulmonary artery systolic pressure. The tricuspid regurgitant velocity is 1.89 m/s, and  with an assumed right atrial pressure of 3 mmHg, the estimated right ventricular systolic pressure is 17.3 mmHg. Left Atrium: Left atrial size was mildly dilated. Right Atrium: Right atrial size was mildly dilated. Pericardium: There is no evidence of pericardial effusion. Mitral Valve: The mitral valve is normal in structure. Mild mitral valve regurgitation. No evidence of mitral valve stenosis. Tricuspid Valve: The tricuspid valve is normal in structure. Tricuspid valve regurgitation is not demonstrated. No evidence of tricuspid stenosis. Aortic Valve: The aortic valve is tricuspid. Aortic valve regurgitation is mild. No aortic stenosis is present. Pulmonic Valve: The pulmonic valve was normal in structure. Pulmonic valve regurgitation is not visualized. No evidence of pulmonic stenosis. Aorta: The aortic root is normal in size and structure.  Venous: The inferior vena cava is normal in size with greater than 50% respiratory variability, suggesting right atrial pressure of 3 mmHg. IAS/Shunts: No atrial level shunt detected by color flow Doppler.  LEFT VENTRICLE PLAX 2D LVIDd:         4.50 cm   Diastology LVIDs:         2.80 cm   LV e' medial:    6.53 cm/s LV PW:         1.30 cm   LV E/e' medial:  10.3 LV IVS:        1.30 cm   LV e' lateral:   7.07 cm/s LVOT diam:     2.30 cm   LV E/e' lateral: 9.5 LV SV:         96 LV SV Index:   50 LVOT Area:     4.15 cm  RIGHT VENTRICLE  IVC RV Basal diam:  3.20 cm     IVC diam: 2.00 cm RV S prime:     12.40 cm/s TAPSE (M-mode): 2.8 cm LEFT ATRIUM             Index        RIGHT ATRIUM           Index LA diam:        4.10 cm 2.13 cm/m   RA Area:     21.90 cm LA Vol (A2C):   78.1 ml 40.60 ml/m  RA Volume:   66.70 ml  34.68 ml/m LA Vol (A4C):   58.5 ml 30.41 ml/m LA Biplane Vol: 70.2 ml 36.50 ml/m  AORTIC VALVE LVOT Vmax:   109.00 cm/s LVOT Vmean:  63.200 cm/s LVOT VTI:    0.231 m  AORTA Ao Root diam: 3.00 cm Ao Asc diam:  3.30 cm MITRAL VALVE               TRICUSPID VALVE MV Area (PHT): 3.06 cm    TR Peak grad:   14.3 mmHg MV Decel Time: 248 msec    TR Vmax:        189.00 cm/s MV E velocity: 67.50 cm/s MV A velocity: 58.00 cm/s  SHUNTS MV E/A ratio:  1.16        Systemic VTI:  0.23 m                            Systemic Diam: 2.30 cm Kardie Tobb DO Electronically signed by Thomasene Ripple DO Signature Date/Time: 12/01/2022/8:23:15 PM    Final    CT ANGIO HEAD NECK W WO CM  Result Date: 12/01/2022 CLINICAL DATA:  62 year old male with dizziness, slurred speech onset this morning. EXAM: CT ANGIOGRAPHY HEAD AND NECK WITH AND WITHOUT CONTRAST TECHNIQUE: Multidetector CT imaging of the head and neck was performed using the standard protocol during bolus administration of intravenous contrast. Multiplanar CT image reconstructions and MIPs were obtained to evaluate the vascular anatomy. Carotid stenosis  measurements (when applicable) are obtained utilizing NASCET criteria, using the distal internal carotid diameter as the denominator. RADIATION DOSE REDUCTION: This exam was performed according to the departmental dose-optimization program which includes automated exposure control, adjustment of the mA and/or kV according to patient size and/or use of iterative reconstruction technique. CONTRAST:  75mL OMNIPAQUE IOHEXOL 350 MG/ML SOLN COMPARISON:  None Available. FINDINGS: CT HEAD Brain: No midline shift, ventriculomegaly, mass effect, evidence of mass lesion, intracranial hemorrhage or evidence of cortically based acute infarction. Gray-white matter differentiation within normal limits for age. Cerebral volume probably within normal limits for age. Calvarium and skull base: Intact, negative. Paranasal sinuses: Tympanic cavities, Visualized paranasal sinuses and mastoids are clear. Orbits: Negative visible orbit and scalp soft tissues; small right posterior convexity benign scalp lipoma series 5, image 37. CTA NECK Skeleton: No acute osseous abnormality identified. Fairly age-appropriate cervical spine degeneration. Upper chest: Negative. Other neck: Negative. Aortic arch: 3 vessel arch with no significant atherosclerosis. Right carotid system: Mild tortuosity with no significant plaque or stenosis. Left carotid system: Similar mild tortuosity with no significant plaque or stenosis. Vertebral arteries: Somewhat diminutive bilateral vertebral arteries, more so on the right. Proximal subclavian arteries appear negative. The left vertebral artery origin is obscured by dense adjacent venous contrast. No significant vertebral artery plaque or stenosis identified the skull base. CTA HEAD Posterior circulation: Diminutive vertebrobasilar system with fetal type bilateral PCA origins. Bilateral  PICA origins appear patent. No vertebrobasilar stenosis identified. Patent SCA origins. Bilateral PCA branches are within normal  limits. Anterior circulation: Both ICA siphons are patent. No siphon plaque or stenosis. Both posterior communicating artery origins appear normal. Patent carotid termini, MCA and ACA origins. Tortuous bilateral A1 segments. Elongated but normal anterior communicating artery on series 12, image 102. Bilateral ACA branches are within normal limits. Left MCA M1 segment is tortuous, patent to the MCA bifurcation without stenosis. Right MCA M1 and bifurcation are patent without stenosis. Bilateral MCA branches are within normal limits. Venous sinuses: Patent. Anatomic variants: Fetal type bilateral PCA origins with diminutive vertebrobasilar system. Review of the MIP images confirms the above findings IMPRESSION: 1. CTA is largely normal for age. Arterial tortuosity. And diminutive vertebrobasilar system on the basis of fetal type PCA origins. But no significant arterial atherosclerosis or stenosis in the head or neck. 2. Normal for age CT appearance of the brain. Electronically Signed   By: Odessa Fleming M.D.   On: 12/01/2022 12:21    Microbiology: No results found for this or any previous visit.  Labs: CBC: Recent Labs  Lab 11/26/22 0000 12/01/22 1100 12/01/22 1121 12/02/22 1158  WBC 3.9 3.1*  --  3.6*  NEUTROABS 2.5 1.7  --  2.1  HGB 12.6* 12.5* 13.6 12.1*  HCT 39.1 41.4 40.0 38.6*  MCV 84 87.9  --  84.3  PLT 142* 141*  --  141*   Basic Metabolic Panel: Recent Labs  Lab 11/26/22 0000 12/01/22 1100 12/01/22 1121 12/02/22 1158  NA 139 138 141 137  K 4.2 3.9 4.0 3.6  CL 104 105 104 105  CO2 21 25  --  26  GLUCOSE 140* 173* 171* 98  BUN 9 15 17 15   CREATININE 1.10 1.10 1.10 0.99  CALCIUM 8.7 9.0  --  8.7*  MG  --   --   --  1.8  PHOS  --   --   --  4.0   Liver Function Tests: Recent Labs  Lab 11/26/22 0000 12/01/22 1100 12/02/22 1158  AST 31 26 21   ALT 30 24 22   ALKPHOS 61 60 51  BILITOT 0.5 0.5 0.6  PROT 6.5 6.4* 6.0*  ALBUMIN 4.0 3.3* 3.1*   CBG: Recent Labs  Lab  12/01/22 1053  GLUCAP 181*   Discharge time spent: greater than 30 minutes.  Signed: Marguerita Merles, DO [facility] 12/02/2022

## 2022-12-02 NOTE — TOC Initial Note (Signed)
Transition of Care [facility]) - Initial/Assessment Note    Patient Details  Name: Jerry Carter MRN: [id number] Date of Birth: 04-Jan-1961  Transition of Care [facility]) CM/SW Contact:    Kermit Balo, RN Phone Number: 12/02/2022, 4:19 PM  Clinical Narrative:                 Pt is from home alone. He says he has neighbors and his mentor that can check on him.  No DME.  Pt uses the bus for transportation. Pt manages his own medications and denies any issues.  No PCP. CM was able to get him an appointment at [facility] and information on his AVS.  Pt states he will take the bus home at d/c.   SDOH Interventions Today    Flowsheet Row Most Recent Value  SDOH Interventions   Food Insecurity Interventions Inpatient TOC  Utilities Interventions Inpatient TOC       Pt states he already uses the food pantry at his church or [employer] when he has to. He says he has been doing well with his art and tries to buy his own food.  CM inquired about issues with his utilities and he denies he has any issues. CM offered him [employer] and he states he doesn't need this resource.   Expected Discharge Plan: Home/Self Care Barriers to Discharge: Continued Medical Work up   Patient Goals and CMS Choice            Expected Discharge Plan and Services   Discharge Planning Services: CM Consult   Living arrangements for the past 2 months: Single Family Home                                      Prior Living Arrangements/Services Living arrangements for the past 2 months: Single Family Home Lives with:: Self Patient language and need for interpreter reviewed:: Yes Do you feel safe going back to the place where you live?: Yes        Care giver support system in place?: No (comment)   Criminal Activity/Legal Involvement Pertinent to Current Situation/Hospitalization: No - Comment as needed  Activities of Daily Living Home Assistive Devices/Equipment:  None ADL Screening (condition at time of admission) Patient's cognitive ability adequate to safely complete daily activities?: Yes Is the patient deaf or have difficulty hearing?: No Does the patient have difficulty seeing, even when wearing glasses/contacts?: No Does the patient have difficulty concentrating, remembering, or making decisions?: No Patient able to express need for assistance with ADLs?: Yes Does the patient have difficulty dressing or bathing?: No Independently performs ADLs?: Yes (appropriate for developmental age) Does the patient have difficulty walking or climbing stairs?: No Weakness of Legs: None Weakness of Arms/Hands: None  Permission Sought/Granted                  Emotional Assessment Appearance:: Appears stated age Attitude/Demeanor/Rapport: Engaged Affect (typically observed): Accepting Orientation: : Oriented to Self, Oriented to Place, Oriented to  Time, Oriented to Situation   Psych Involvement: No (comment)  Admission diagnosis:  Dizziness [R42] Slurred speech [R47.81] Acute focal neurological deficit [R29.818] Patient Active Problem List   Diagnosis Date Noted   Acute focal neurological deficit 12/01/2022   Hyperlipidemia 03/06/2017   Hypertension    Schizoaffective disorder (HCC) 02/16/2014   PCP:  Oneita Hurt, No Pharmacy:   [id number] - [place],  [place] - [address] [place] [address] Phone: [phone] Fax: [phone]  [facility] [address] [place] [address] Phone: [phone] Fax: [phone]     Social Determinants of Health (SDOH) Social History: SDOH Screenings   Food Insecurity: Food Insecurity Present (12/02/2022)  Housing: Medium Risk (12/02/2022)  Transportation Needs: No Transportation Needs (12/02/2022)  Utilities: At Risk (12/02/2022)  Depression (PHQ2-9): Low Risk  (04/30/2022)  Tobacco Use: Medium Risk (12/01/2022)   SDOH  Interventions: Food Insecurity Interventions: Inpatient TOC Utilities Interventions: Inpatient TOC   Readmission Risk Interventions     No data to display

## 2022-12-03 ENCOUNTER — Telehealth (HOSPITAL_COMMUNITY): Payer: Self-pay | Admitting: *Deleted

## 2022-12-03 ENCOUNTER — Other Ambulatory Visit (HOSPITAL_COMMUNITY): Payer: Self-pay

## 2022-12-03 NOTE — Telephone Encounter (Signed)
Thank you for this update.  Please continue to attempt to notify patient.

## 2022-12-03 NOTE — Telephone Encounter (Signed)
Patient called left a VM stating that he can't get his injection at the pharmacy because he can't afford it and they say Medicaid wont pay. Called his pharmacy and was told that Medicaid states he has Medicare. Called  tracks and was told that he does indeed have medicare part D for pharmacy benefits. Then called Medicare and was told they could not give out the information without the patient present. Called patient, no answer but left a message to call back. We will need his Medicare card scanned in the system as well as given to his pharmacy to process his medication.

## 2022-12-04 ENCOUNTER — Other Ambulatory Visit (HOSPITAL_COMMUNITY): Payer: Self-pay

## 2022-12-05 ENCOUNTER — Telehealth (HOSPITAL_COMMUNITY): Payer: Self-pay | Admitting: *Deleted

## 2022-12-05 NOTE — Telephone Encounter (Signed)
Patient arrived @ clinic after receiving a phone message. I informed patient that Rx :: [id number] - [place], [place] - [address]  Has been trying to reach him to get his insurance information into system. [place](Medicaid Office) has been able to get patient Medicaid BUT patient has medicare part d benefits that will cover medication. RX is needing member id # bin # & group # off the medicare card.   Medicare is now the primary  & medicaid will not cover.  So patient made need samples until his insurance information gets resolved. Patient did say he would reach out to his sponsor to see if he received the card  so that he could share the information.  Patient did have old insurance cards from [employer] that are no longer valid

## 2022-12-08 ENCOUNTER — Telehealth (HOSPITAL_COMMUNITY): Payer: Self-pay

## 2022-12-08 NOTE — Telephone Encounter (Signed)
Thank you for this update 

## 2022-12-10 ENCOUNTER — Encounter (HOSPITAL_COMMUNITY): Payer: Self-pay

## 2022-12-10 ENCOUNTER — Ambulatory Visit (INDEPENDENT_AMBULATORY_CARE_PROVIDER_SITE_OTHER): Payer: Medicaid Other | Admitting: *Deleted

## 2022-12-10 VITALS — BP 141/102 | HR 73 | Ht 70.0 in | Wt 163.0 lb

## 2022-12-10 DIAGNOSIS — F250 Schizoaffective disorder, bipolar type: Secondary | ICD-10-CM

## 2022-12-10 NOTE — Progress Notes (Signed)
In for his monthly Abilify M inj given today in his R GLUTEAL muscle. He is late in the day because he was trying to work to get some money as he says he is short today. He is low energy today compared to his usual affect and speech. States he was in the ED last week with feelings of being "off balanced" and was diagnosed with having a mild stroke and started on losartan and atrovestin and has a futue appt with [facility] to follow up with his BP. Discussed with him the importance of this. He states he is taking his BP meds daily but today his BP is 141/102. His health appt is 12/31/22. Spent along time on phone with Clinical research associate trying to contact Medicare and Social Security to get proof he no longer is on disability and doesn't get medicare so he can get his shot thru his MCD. Told by Medicare staff he hasn't had Medicare since 2019. Social security contacted and will send a letter to patient in the next 7 days saying he no longer has medicare. He is to take it with him to pharmacy in next 28 days when he picks his shot up for his next appt.

## 2022-12-21 ENCOUNTER — Other Ambulatory Visit: Payer: Self-pay

## 2022-12-21 ENCOUNTER — Ambulatory Visit (HOSPITAL_COMMUNITY)
Admission: EM | Admit: 2022-12-21 | Discharge: 2022-12-22 | Disposition: A | Discharge status: Other Acute Inpatient Hospital | Payer: Medicaid Other | Attending: Family | Admitting: Family

## 2022-12-21 DIAGNOSIS — Z1152 Encounter for screening for COVID-19: Secondary | ICD-10-CM | POA: Insufficient documentation

## 2022-12-21 DIAGNOSIS — F250 Schizoaffective disorder, bipolar type: Secondary | ICD-10-CM

## 2022-12-21 DIAGNOSIS — F309 Manic episode, unspecified: Secondary | ICD-10-CM

## 2022-12-21 DIAGNOSIS — F0633 Mood disorder due to known physiological condition with manic features: Secondary | ICD-10-CM | POA: Insufficient documentation

## 2022-12-21 LAB — COMPREHENSIVE METABOLIC PANEL
ALT: 53 U/L — ABNORMAL HIGH (ref 0–44)
AST: 87 U/L — ABNORMAL HIGH (ref 15–41)
Albumin: 3.8 g/dL (ref 3.5–5.0)
Alkaline Phosphatase: 52 U/L (ref 38–126)
Anion gap: 13 (ref 5–15)
BUN: 19 mg/dL (ref 8–23)
CO2: 26 mmol/L (ref 22–32)
Calcium: 9.2 mg/dL (ref 8.9–10.3)
Chloride: 101 mmol/L (ref 98–111)
Creatinine, Ser: 1.27 mg/dL — ABNORMAL HIGH (ref 0.61–1.24)
GFR, Estimated: >60 mL/min (ref >60)
Glucose, Bld: 88 mg/dL (ref 70–99)
Potassium: 3.8 mmol/L (ref 3.5–5.1)
Sodium: 140 mmol/L (ref 135–145)
Total Bilirubin: 0.7 mg/dL (ref 0.3–1.2)
Total Protein: 7.1 g/dL (ref 6.5–8.1)

## 2022-12-21 LAB — SARS CORONAVIRUS 2 BY RT PCR: SARS Coronavirus 2 by RT PCR: NEGATIVE

## 2022-12-21 LAB — CBC WITH DIFFERENTIAL/PLATELET
Abs Immature Granulocytes: 0.01 10*3/uL (ref 0.00–0.07)
Basophils Absolute: 0 10*3/uL (ref 0.0–0.1)
Basophils Relative: 0 %
Eosinophils Absolute: 0.1 10*3/uL (ref 0.0–0.5)
Eosinophils Relative: 2 %
HCT: 39.9 % (ref 39.0–52.0)
Hemoglobin: 12.7 g/dL — ABNORMAL LOW (ref 13.0–17.0)
Immature Granulocytes: 0 %
Lymphocytes Relative: 25 %
Lymphs Abs: 0.8 10*3/uL (ref 0.7–4.0)
MCH: 26.7 pg (ref 26.0–34.0)
MCHC: 31.8 g/dL (ref 30.0–36.0)
MCV: 83.8 fL (ref 80.0–100.0)
Monocytes Absolute: 0.3 10*3/uL (ref 0.1–1.0)
Monocytes Relative: 8 %
Neutro Abs: 2.1 10*3/uL (ref 1.7–7.7)
Neutrophils Relative %: 65 %
Platelets: 196 10*3/uL (ref 150–400)
RBC: 4.76 MIL/uL (ref 4.22–5.81)
RDW: 13.8 % (ref 11.5–15.5)
WBC: 3.1 10*3/uL — ABNORMAL LOW (ref 4.0–10.5)
nRBC: 0 % (ref 0.0–0.2)

## 2022-12-21 LAB — TSH: TSH: 1.893 u[IU]/mL (ref 0.350–4.500)

## 2022-12-21 LAB — LIPID PANEL
Cholesterol: 119 mg/dL (ref 0–200)
HDL: 47 mg/dL (ref >40)
LDL Cholesterol: 66 mg/dL (ref 0–99)
Total CHOL/HDL Ratio: 2.5 RATIO
Triglycerides: 32 mg/dL (ref <150)
VLDL: 6 mg/dL (ref 0–40)

## 2022-12-21 MED ORDER — ALUM & MAG HYDROXIDE-SIMETH 200-200-20 MG/5ML PO SUSP: 30.0000 mL | ORAL | Status: DC | PRN

## 2022-12-21 MED ORDER — ACETAMINOPHEN 325 MG PO TABS: 650.0000 mg | ORAL_TABLET | Freq: Four times a day (QID) | ORAL | Status: DC | PRN

## 2022-12-21 MED ORDER — LORAZEPAM 1 MG PO TABS
2.0000 mg | ORAL_TABLET | Freq: Once | ORAL | Status: AC
  Administered 2022-12-21: 2 mg via ORAL
  Filled 2022-12-21: qty 2

## 2022-12-21 MED ORDER — HYDROXYZINE HCL 25 MG PO TABS
25.0000 mg | ORAL_TABLET | Freq: Three times a day (TID) | ORAL | Status: DC | PRN
  Administered 2022-12-21: 25 mg via ORAL
  Filled 2022-12-21: qty 1

## 2022-12-21 MED ORDER — TRAZODONE HCL 50 MG PO TABS
50.0000 mg | ORAL_TABLET | Freq: Every evening | ORAL | Status: DC | PRN
  Administered 2022-12-21: 50 mg via ORAL
  Filled 2022-12-21: qty 1

## 2022-12-21 NOTE — Discharge Instructions (Signed)
Pt was accepted to [facility] TODAY 12/21/2022 Bed assignment: [facility]   Pt meets inpatient criteria per Hillery Jacks, NP   Attending Physician will be Norm Parcel, MD   Report can be called to: [phone]   Pt can arrive after 4 PM   Care Team Notified: Hillery Jacks, NP, Harless Litten, RN, and Laretta Alstrom, LPN

## 2022-12-21 NOTE — Progress Notes (Signed)
LCSW Progress Note  [id number]   Jerry Carter  12/21/2022  9:36 AM  Description:   Inpatient Psychiatric Referral  Patient was recommended inpatient per Hillery Jacks, NP. There are no available beds at [facility], per [facility] Rona Ravens, RN. Patient was referred to the following out of network facilities:   Destination  Service Provider Address Phone Fax  [facility]  [address] [place], [place] [address] [phone]  [facility]  [address] [place] [address] [phone]  [facility]  [address] [place] [address] [phone]  [facility]  [address] [place], [place] [address] [phone]  [facility]  [address] [place] [address] [phone]  [facility]  [address] [place] [address] [phone]  [facility]  [address], [place] [address] [phone]  [facility]  [address], [place] [address] [phone]  [facility]  [address], [place] [address] [phone]  [facility]  [address] [place] [address] [phone]  [facility]  [address] [place] [address] [phone]  [facility]  [address], [place] [address] [phone]  [facility]  [address], [place] [address] [phone]  [facility]  [address] [place], [place] [address] [phone]  [facility]  [address], [place] [address] [phone]  [facility]  [address], [place] [address] [phone]  [facility]  [address] [place] [address] [phone]  [facility]  [address], [place] [address] [phone]  [facility]  [address] [place] [address] [phone]  [facility]  [address] [place] [address] [phone]  [facility]  [address], [place] [address] [phone]  [facility]  [address] [place], [place] [address] [phone]  [facility]  [address] [place] [address] [phone]  [facility]  [address] [place] [address] [phone]  [facility]  [address] [place] [address] [phone]  [facility]  [address] [place] [address] [phone]  [facility]  [address] [place] [address] [phone]  [facility]  [address] [place] [address] [phone]    Situation ongoing, CSW to continue following and update chart as more information becomes available.      Cathie Beams, [place]  12/21/2022 9:36 AM

## 2022-12-21 NOTE — ED Notes (Signed)
Attempted to call [facility] x2 to check for bed availability for this patient as instructed earlier in the day. No answer either time. Will inform oncoming shift.

## 2022-12-21 NOTE — Progress Notes (Signed)
10:33 AM - CSW received a phone call from [place], intake department, at [facility]. Donnal Debar reports that pt is appropriate for their unit, however, they will need updated labs before pt is accepted. CSW will notify NP and RN of request for labs.  Cathie Beams, LCSW  12/21/2022 10:35 AM

## 2022-12-21 NOTE — Progress Notes (Signed)
Pt was accepted to [facility] TODAY 12/21/2022 Bed assignment: [facility]  Pt meets inpatient criteria per Hillery Jacks, NP  Attending Physician will be Norm Parcel, MD  Report can be called to: [phone]  Pt can arrive after 4 PM  Care Team Notified: Hillery Jacks, NP, Harless Litten, RN, and Laretta Alstrom, LPN  [place], [place]  12/21/2022 12:01 PM

## 2022-12-21 NOTE — ED Provider Notes (Addendum)
[facility]  Date: 12/21/22 Patient Name: Jerry Carter MRN: [id number] Chief Complaint: manic behavior   Diagnoses:  Final diagnoses:  Mania [facility])  Schizoaffective disorder, bipolar type [facility])    HPI: Jerry Carter is a 62 year old male that presents to [facility] urgent care accompanied by family friend.  He reports patient was recently discharged from inpatient admission due to having a mild stroke.  States he is unsure of the medications that he received from his stroke is interfering with his medication for his schizophrenia.  Patient presents pressured tangential and labile.  Slightly disorganized but pleasant.  He is denying suicidal or homicidal ideations.  Denies visual hallucinations.  Unsure if patient has been medication compliant.  Patient reports he resides alone.  Reports he is currently employed at [employer].  He denied illicit drug use or substance abuse use recently.  Patient has a charted history of bipolar disorder, schizophrenia and schizoaffective disorder. Patient is currently prescribed Abilify maintainer 400 mg monthly injection last injection was 12/10/2022.    During evaluation Jerry Carter is pacing the unit; " I am a rapper."  he is alert/oriented x 3; calm/cooperative; and labile.   Mood is incongruent with affect.  Patient is tangential, pressured and disorganized and magical thinking. Patient  speaking  tone at loud volume, and fast pace; with good eye contact. Her thought process is incoherent and relevant;  Jerry Carter is currently responding to internal stimuli and appears to be experiencing delusional thought content.  Patient denies suicidal/self-harm/homicidal ideation, psychosis, and paranoia.  Patient has remained calm throughout assessment and has answered questions appropriately.    Total Time spent with patient: 15 minutes  Musculoskeletal  Strength & Muscle Tone: within normal limits Gait & Station: normal Patient leans: N/A  Psychiatric  Specialty Exam  Presentation General Appearance: Casual  Eye Contact:Good  Speech:Pressured  Speech Volume:Increased  Handedness:Right   Mood and Affect  Mood:Anxious  Affect:Labile   Thought Process  Thought Processes:Disorganized  Descriptions of Associations:Tangential  Orientation:Full (Time, Place and Person)  Thought Content:Tangential; Scattered    Hallucinations:Hallucinations: Auditory  Ideas of Reference:Paranoia  Suicidal Thoughts:Suicidal Thoughts: No  Homicidal Thoughts:Homicidal Thoughts: No   Sensorium  Memory:Immediate Fair; Recent Fair; Remote Fair  Judgment:Fair  Insight:Fair   Executive Functions  Concentration:Fair  Attention Span:Good  Recall:Good  Fund of Knowledge:Good  Language:Fair   Psychomotor Activity  Psychomotor Activity:Psychomotor Activity: Restlessness   Assets  Assets:Desire for Improvement; Social Support   Sleep  Sleep:Sleep: Fair   Nutritional Assessment (For OBS and FBC admissions only) Has the patient had a weight loss or gain of 10 pounds or more in the last 3 months?: No Has the patient had a decrease in food intake/or appetite?: No Does the patient have dental problems?: No Does the patient have eating habits or behaviors that may be indicators of an eating disorder including binging or inducing vomiting?: No Has the patient recently lost weight without trying?: 0 Has the patient been eating poorly because of a decreased appetite?: 0 Malnutrition Screening Tool Score: 0    Physical Exam Vitals and nursing note reviewed.  Cardiovascular:     Rate and Rhythm: Normal rate and regular rhythm.  Neurological:     Mental Status: He is alert and oriented to person, place, and time.  Psychiatric:        Mood and Affect: Mood normal.        Behavior: Behavior normal.   Review of Systems  Constitutional: Negative.  Psychiatric/Behavioral:  The patient is nervous/anxious.   All other systems  reviewed and are negative.   Blood pressure (!) 165/103, pulse 83, temperature 98.1 F (36.7 C), temperature source Oral, resp. rate 20. There is no height or weight on file to calculate BMI.  Past Psychiatric History:   Is the patient at risk to self? Yes  Has the patient been a risk to self in the past 6 months? Yes .    Has the patient been a risk to self within the distant past? No   Is the patient a risk to others? No   Has the patient been a risk to others in the past 6 months? No   Has the patient been a risk to others within the distant past? No   Past Medical History:   Family History:   Social History:   Last Labs:  Admission on 12/01/2022, Discharged on 12/02/2022  Component Date Value Ref Range Status   Glucose-Capillary 12/01/2022 181 (H)  70 - 99 mg/dL Final   Glucose reference range applies only to samples taken after fasting for at least 8 hours.   Sodium 12/01/2022 141  135 - 145 mmol/L Final   Potassium 12/01/2022 4.0  3.5 - 5.1 mmol/L Final   Chloride 12/01/2022 104  98 - 111 mmol/L Final   BUN 12/01/2022 17  8 - 23 mg/dL Final   Creatinine, Ser 12/01/2022 1.10  0.61 - 1.24 mg/dL Final   Glucose, Bld 40/98/1191 171 (H)  70 - 99 mg/dL Final   Glucose reference range applies only to samples taken after fasting for at least 8 hours.   Calcium, Ion 12/01/2022 1.14 (L)  1.15 - 1.40 mmol/L Final   TCO2 12/01/2022 28  22 - 32 mmol/L Final   Hemoglobin 12/01/2022 13.6  13.0 - 17.0 g/dL Final   HCT 47/82/9562 40.0  39.0 - 52.0 % Final   Alcohol, Ethyl (B) 12/01/2022 <10  <10 mg/dL Final   Comment: (NOTE) Lowest detectable limit for serum alcohol is 10 mg/dL.  For medical purposes only. Performed at [facility] Lab, [address], [place], [place] [address]    Prothrombin Time 12/01/2022 14.0  11.4 - 15.2 seconds Final   INR 12/01/2022 1.1  0.8 - 1.2 Final   Comment: (NOTE) INR goal varies based on device and disease states. Performed at [facility] Lab, [address], [place], [place] [address]    aPTT 12/01/2022 27  24 - 36 seconds Final   Performed at [facility] Lab, [address], [place], [place] [address]   WBC 12/01/2022 3.1 (L)  4.0 - 10.5 K/uL Final   RBC 12/01/2022 4.71  4.22 - 5.81 MIL/uL Final   Hemoglobin 12/01/2022 12.5 (L)  13.0 - 17.0 g/dL Final   HCT 28/41/3244 41.4  39.0 - 52.0 % Final   MCV 12/01/2022 87.9  80.0 - 100.0 fL Final   MCH 12/01/2022 26.5  26.0 - 34.0 pg Final   MCHC 12/01/2022 30.2  30.0 - 36.0 g/dL Final   RDW 07/22/7251 14.1  11.5 - 15.5 % Final   Platelets 12/01/2022 141 (L)  150 - 400 K/uL Final   nRBC 12/01/2022 0.0  0.0 - 0.2 % Final   Performed at [facility] Lab, [address] [place], [place] [address]   Neutrophils Relative % 12/01/2022 57  % Final   Neutro Abs 12/01/2022 1.7  1.7 - 7.7 K/uL Final   Lymphocytes Relative 12/01/2022 31  %  Final   Lymphs Abs 12/01/2022 1.0  0.7 - 4.0 K/uL Final   Monocytes Relative 12/01/2022 9  % Final   Monocytes Absolute 12/01/2022 0.3  0.1 - 1.0 K/uL Final   Eosinophils Relative 12/01/2022 3  % Final   Eosinophils Absolute 12/01/2022 0.1  0.0 - 0.5 K/uL Final   Basophils Relative 12/01/2022 0  % Final   Basophils Absolute 12/01/2022 0.0  0.0 - 0.1 K/uL Final   Immature Granulocytes 12/01/2022 0  % Final   Abs Immature Granulocytes 12/01/2022 0.00  0.00 - 0.07 K/uL Final   Performed at [facility] Lab, [address] [place], [place] [address]   Sodium 12/01/2022 138  135 - 145 mmol/L Final   Potassium 12/01/2022 3.9  3.5 - 5.1 mmol/L Final   Chloride 12/01/2022 105  98 - 111 mmol/L Final   CO2 12/01/2022 25  22 - 32 mmol/L Final   Glucose, Bld 12/01/2022 173 (H)  70 - 99 mg/dL Final   Glucose reference range applies only to samples taken after fasting for at least 8 hours.   BUN 12/01/2022 15  8 - 23 mg/dL Final   Creatinine, Ser 12/01/2022 1.10  0.61 - 1.24 mg/dL Final   Calcium 02/54/2706 9.0  8.9 - 10.3 mg/dL Final   Total  Protein 12/01/2022 6.4 (L)  6.5 - 8.1 g/dL Final   Albumin 23/76/2831 3.3 (L)  3.5 - 5.0 g/dL Final   AST 51/76/1607 26  15 - 41 U/L Final   ALT 12/01/2022 24  0 - 44 U/L Final   Alkaline Phosphatase 12/01/2022 60  38 - 126 U/L Final   Total Bilirubin 12/01/2022 0.5  0.3 - 1.2 mg/dL Final   GFR, Estimated 12/01/2022 >60  >60 mL/min Final   Comment: (NOTE) Calculated using the CKD-EPI Creatinine Equation (2021)    Anion gap 12/01/2022 8  5 - 15 Final   Performed at [facility] Lab, [address] [place], [place] [address]   Opiates 12/01/2022 NONE DETECTED  NONE DETECTED Final   Cocaine 12/01/2022 NONE DETECTED  NONE DETECTED Final   Benzodiazepines 12/01/2022 NONE DETECTED  NONE DETECTED Final   Amphetamines 12/01/2022 NONE DETECTED  NONE DETECTED Final   Tetrahydrocannabinol 12/01/2022 NONE DETECTED  NONE DETECTED Final   Barbiturates 12/01/2022 NONE DETECTED  NONE DETECTED Final   Comment: (NOTE) DRUG SCREEN FOR MEDICAL PURPOSES ONLY.  IF CONFIRMATION IS NEEDED FOR ANY PURPOSE, NOTIFY LAB WITHIN 5 DAYS.  LOWEST DETECTABLE LIMITS FOR URINE DRUG SCREEN Drug Class                     Cutoff (ng/mL) Amphetamine and metabolites    1000 Barbiturate and metabolites    200 Benzodiazepine                 200 Opiates and metabolites        300 Cocaine and metabolites        300 THC                            50 Performed at [facility] Lab, [address], [place], [place] [address]    Color, Urine 12/01/2022 STRAW (A)  YELLOW Final   APPearance 12/01/2022 CLEAR  CLEAR Final   Specific Gravity, Urine 12/01/2022 1.011  1.005 - 1.030 Final   pH 12/01/2022 6.0  5.0 - 8.0 Final   Glucose, UA 12/01/2022 150 (A)  NEGATIVE mg/dL Final   Hgb urine dipstick 12/01/2022 NEGATIVE  NEGATIVE Final   Bilirubin Urine 12/01/2022 NEGATIVE  NEGATIVE Final   Ketones, ur 12/01/2022 NEGATIVE  NEGATIVE mg/dL Final   Protein, ur 16/04/9603 NEGATIVE  NEGATIVE mg/dL Final   Nitrite 54/03/8118  NEGATIVE  NEGATIVE Final   Leukocytes,Ua 12/01/2022 NEGATIVE  NEGATIVE Final   Performed at [facility] Lab, [address] [place], [place] [address]   Troponin I (High Sensitivity) 12/01/2022 10  <18 ng/L Final   Comment: (NOTE) Elevated high sensitivity troponin I (hsTnI) values and significant  changes across serial measurements may suggest ACS but many other  chronic and acute conditions are known to elevate hsTnI results.  Refer to the "Links" section for chest pain algorithms and additional  guidance. Performed at [facility] Lab, [address] [place], [place] [address]    Troponin I (High Sensitivity) 12/01/2022 11  <18 ng/L Final   Comment: (NOTE) Elevated high sensitivity troponin I (hsTnI) values and significant  changes across serial measurements may suggest ACS but many other  chronic and acute conditions are known to elevate hsTnI results.  Refer to the "Links" section for chest pain algorithms and additional  guidance. Performed at [facility] Lab, [address], [place], [place] [address]    HIV Screen 4th Generation wRfx 12/01/2022 Non Reactive  Non Reactive Final   Performed at [facility] Lab, [address] [place], [place] [address]   Weight 12/01/2022 2,640  oz Final   Height 12/01/2022 70  in Final   BP 12/01/2022 171/98  mmHg Final   S' Lateral 12/01/2022 2.80  cm Final   Area-P 1/2 12/01/2022 3.06  cm2 Final   Est EF 12/01/2022 60 - 65%   Final   Cholesterol 12/02/2022 150  0 - 200 mg/dL Final   Triglycerides 62/95/2841 65  <150 mg/dL Final   HDL 32/44/0102 46  >40 mg/dL Final   Total CHOL/HDL Ratio 12/02/2022 3.3  RATIO Final   VLDL 12/02/2022 13  0 - 40 mg/dL Final   LDL Cholesterol 12/02/2022 91  0 - 99 mg/dL Final   Comment:        Total Cholesterol/HDL:CHD Risk Coronary Heart Disease Risk Table                     Men   Women  1/2 Average Risk   3.4   3.3  Average Risk       5.0   4.4  2 X Average Risk   9.6   7.1  3 X  Average Risk  23.4   11.0        Use the calculated Patient Ratio above and the CHD Risk Table to determine the patient's CHD Risk.        ATP III CLASSIFICATION (LDL):  <100     mg/dL   Optimal  725-366  mg/dL   Near or Above                    Optimal  130-159  mg/dL   Borderline  440-347  mg/dL   High  >425     mg/dL   Very High Performed at [facility] Lab, [address] [place], [place] [address]    Hgb A1c MFr Bld 12/02/2022 5.8 (H)  4.8 - 5.6 % Final   Comment: (NOTE) Pre diabetes:          5.7%-6.4%  Diabetes:              >6.4%  Glycemic control for   <7.0% adults with diabetes    Mean Plasma Glucose 12/02/2022 119.76  mg/dL Final   Performed at [facility] Lab, [address] [place], [place] [address]   Sodium 12/02/2022 137  135 - 145 mmol/L Final   Potassium 12/02/2022 3.6  3.5 - 5.1 mmol/L Final   Chloride 12/02/2022 105  98 - 111 mmol/L Final   CO2 12/02/2022 26  22 - 32 mmol/L Final   Glucose, Bld 12/02/2022 98  70 - 99 mg/dL Final   Glucose reference range applies only to samples taken after fasting for at least 8 hours.   BUN 12/02/2022 15  8 - 23 mg/dL Final   Creatinine, Ser 12/02/2022 0.99  0.61 - 1.24 mg/dL Final   Calcium 60/45/4098 8.7 (L)  8.9 - 10.3 mg/dL Final   Total Protein 11/91/4782 6.0 (L)  6.5 - 8.1 g/dL Final   Albumin 95/62/1308 3.1 (L)  3.5 - 5.0 g/dL Final   AST 65/78/4696 21  15 - 41 U/L Final   ALT 12/02/2022 22  0 - 44 U/L Final   Alkaline Phosphatase 12/02/2022 51  38 - 126 U/L Final   Total Bilirubin 12/02/2022 0.6  0.3 - 1.2 mg/dL Final   GFR, Estimated 12/02/2022 >60  >60 mL/min Final   Comment: (NOTE) Calculated using the CKD-EPI Creatinine Equation (2021)    Anion gap 12/02/2022 6  5 - 15 Final   Performed at [facility] Lab, [address], [place], [place] [address]   WBC 12/02/2022 3.6 (L)  4.0 - 10.5 K/uL Final   RBC 12/02/2022 4.58  4.22 - 5.81 MIL/uL Final   Hemoglobin 12/02/2022 12.1 (L)  13.0 - 17.0  g/dL Final   HCT 41/32/4401 38.6 (L)  39.0 - 52.0 % Final   MCV 12/02/2022 84.3  80.0 - 100.0 fL Final   MCH 12/02/2022 26.4  26.0 - 34.0 pg Final   MCHC 12/02/2022 31.3  30.0 - 36.0 g/dL Final   RDW 02/72/5366 14.2  11.5 - 15.5 % Final   Platelets 12/02/2022 141 (L)  150 - 400 K/uL Final   nRBC 12/02/2022 0.0  0.0 - 0.2 % Final   Neutrophils Relative % 12/02/2022 59  % Final   Neutro Abs 12/02/2022 2.1  1.7 - 7.7 K/uL Final   Lymphocytes Relative 12/02/2022 32  % Final   Lymphs Abs 12/02/2022 1.2  0.7 - 4.0 K/uL Final   Monocytes Relative 12/02/2022 8  % Final   Monocytes Absolute 12/02/2022 0.3  0.1 - 1.0 K/uL Final   Eosinophils Relative 12/02/2022 1  % Final   Eosinophils Absolute 12/02/2022 0.1  0.0 - 0.5 K/uL Final   Basophils Relative 12/02/2022 0  % Final   Basophils Absolute 12/02/2022 0.0  0.0 - 0.1 K/uL Final   Immature Granulocytes 12/02/2022 0  % Final   Abs Immature Granulocytes 12/02/2022 0.01  0.00 - 0.07 K/uL Final   Performed at [facility] Lab, [address] [place], [place] [address]   Magnesium 12/02/2022 1.8  1.7 - 2.4 mg/dL Final   Performed at [facility] Lab, [address], [place], [place] [address]   Phosphorus 12/02/2022 4.0  2.5 - 4.6 mg/dL Final   Performed at [facility] Lab, [address], [place], [place] [address]  Admission on 12/01/2022, Discharged on 12/01/2022  Component Date Value Ref  Range Status   POCT Glucose ([facility]) 12/01/2022 60 (A)  70 - 99 mg/dL Final  Refill on 11/91/4782  Component Date Value Ref Range Status   WBC 11/26/2022 3.9  3.4 - 10.8 x10E3/uL Final   RBC 11/26/2022 4.65  4.14 - 5.80 x10E6/uL Final   Hemoglobin 11/26/2022 12.6 (L)  13.0 - 17.7 g/dL Final   Hematocrit 95/62/1308 39.1  37.5 - 51.0 % Final   MCV 11/26/2022 84  79 - 97 fL Final   MCH 11/26/2022 27.1  26.6 - 33.0 pg Final   MCHC 11/26/2022 32.2  31.5 - 35.7 g/dL Final   RDW 65/78/4696 13.2  11.6 - 15.4 % Final   Platelets 11/26/2022 142 (L)  150 - 450  x10E3/uL Final   Neutrophils 11/26/2022 63  Not Estab. % Final   Lymphs 11/26/2022 30  Not Estab. % Final   Monocytes 11/26/2022 6  Not Estab. % Final   Eos 11/26/2022 1  Not Estab. % Final   Basos 11/26/2022 0  Not Estab. % Final   Neutrophils Absolute 11/26/2022 2.5  1.4 - 7.0 x10E3/uL Final   Lymphocytes Absolute 11/26/2022 1.2  0.7 - 3.1 x10E3/uL Final   Monocytes Absolute 11/26/2022 0.3  0.1 - 0.9 x10E3/uL Final   EOS (ABSOLUTE) 11/26/2022 0.0  0.0 - 0.4 x10E3/uL Final   Basophils Absolute 11/26/2022 0.0  0.0 - 0.2 x10E3/uL Final   Immature Granulocytes 11/26/2022 0  Not Estab. % Final   Immature Grans (Abs) 11/26/2022 0.0  0.0 - 0.1 x10E3/uL Final   Glucose 11/26/2022 140 (H)  70 - 99 mg/dL Final   BUN 29/52/8413 9  8 - 27 mg/dL Final   Creatinine, Ser 11/26/2022 1.10  0.76 - 1.27 mg/dL Final   eGFR 24/40/1027 75  >59 mL/min/1.73 Final   BUN/Creatinine Ratio 11/26/2022 8 (L)  10 - 24 Final   Sodium 11/26/2022 139  134 - 144 mmol/L Final   Potassium 11/26/2022 4.2  3.5 - 5.2 mmol/L Final   Chloride 11/26/2022 104  96 - 106 mmol/L Final   CO2 11/26/2022 21  20 - 29 mmol/L Final   Calcium 11/26/2022 8.7  8.6 - 10.2 mg/dL Final   Total Protein 25/36/6440 6.5  6.0 - 8.5 g/dL Final   Albumin 34/74/2595 4.0  3.9 - 4.9 g/dL Final   Globulin, Total 11/26/2022 2.5  1.5 - 4.5 g/dL Final   Albumin/Globulin Ratio 11/26/2022 1.6  1.2 - 2.2 Final   Bilirubin Total 11/26/2022 0.5  0.0 - 1.2 mg/dL Final   Alkaline Phosphatase 11/26/2022 61  44 - 121 IU/L Final   AST 11/26/2022 31  0 - 40 IU/L Final   ALT 11/26/2022 30  0 - 44 IU/L Final   Cholesterol, Total 11/26/2022 143  100 - 199 mg/dL Final   Triglycerides 63/87/5643 40  0 - 149 mg/dL Final   HDL 32/95/1884 53  >39 mg/dL Final   VLDL Cholesterol Cal 11/26/2022 9  5 - 40 mg/dL Final   LDL Chol Calc (NIH) 11/26/2022 81  0 - 99 mg/dL Final   Chol/HDL Ratio 11/26/2022 2.7  0.0 - 5.0 ratio Final   Comment:                                    T. Chol/HDL Ratio  Men  Women                               1/2 Avg.Risk  3.4    3.3                                   Avg.Risk  5.0    4.4                                2X Avg.Risk  9.6    7.1                                3X Avg.Risk 23.4   11.0    TSH 11/26/2022 1.110  0.450 - 4.500 uIU/mL Final   T4, Total 11/26/2022 6.7  4.5 - 12.0 ug/dL Final   T3 Uptake Ratio 11/26/2022 26  24 - 39 % Final   Free Thyroxine Index 11/26/2022 1.7  1.2 - 4.9 Final   Bilirubin, Direct 11/26/2022 0.14  0.00 - 0.40 mg/dL Final   Hgb U9W MFr Bld 11/26/2022 6.0 (H)  4.8 - 5.6 % Final   Comment:          Prediabetes: 5.7 - 6.4          Diabetes: >6.4          Glycemic control for adults with diabetes: <7.0    Est. average glucose Bld gHb Est-m* 11/26/2022 126  mg/dL Final   specimen status report 11/26/2022 Comment   Final   Comment: Please note Please note The date and/or time of collection was not indicated on the requisition as required by state and federal law.  The date of receipt of the specimen was used as the collection date if not supplied.     Allergies: Patient has no known allergies.  Medications:  Facility Ordered Medications  Medication   acetaminophen (TYLENOL) tablet 650 mg   alum & mag hydroxide-simeth (MAALOX/MYLANTA) 200-200-20 MG/5ML suspension 30 mL   traZODone (DESYREL) tablet 50 mg   hydrOXYzine (ATARAX) tablet 25 mg   [COMPLETED] LORazepam (ATIVAN) tablet 2 mg   PTA Medications  Medication Sig   ARIPiprazole ER (ABILIFY MAINTENA) 400 MG PRSY prefilled syringe Inject 400 mg into the muscle every 30 (thirty) days.   atorvastatin (LIPITOR) 40 MG tablet Take 1 tablet (40 mg total) by mouth daily.   losartan (COZAAR) 100 MG tablet Take 1 tablet (100 mg total) by mouth daily.   acetaminophen (TYLENOL) 325 MG tablet Take 2 tablets (650 mg total) by mouth every 4 (four) hours as needed for mild pain (or temp > 37.5 C (99.5 F)).    aspirin EC 81 MG tablet Take 1 tablet (81 mg total) by mouth daily. Swallow whole.   clopidogrel (PLAVIX) 75 MG tablet Take 1 tablet (75 mg total) by mouth daily.   senna-docusate (SENOKOT-S) 8.6-50 MG tablet Take 1 tablet by mouth at bedtime as needed for moderate constipation or mild constipation.      Medical Decision Making  Inpatient admission -Patient was given 2 mg of Ativan stat - patient accepted to [facility].    Pt was accepted to [facility] TODAY 12/21/2022 Bed assignment: [facility]  Pt meets inpatient criteria per Hillery Jacks, NP  Attending Physician will be Norm Parcel, MD  Report can be called to: [phone]  Pt can arrive after 4 PM  Care Team Notified: Hillery Jacks, NP, Jerry Litten, RN, and Laretta Alstrom, LP   Recommendations  Based on my evaluation the patient does not appear to have an emergency medical condition.  Oneta Rack, NP 12/21/22  9:17 AM

## 2022-12-21 NOTE — ED Notes (Signed)
Attempted to call report to [facility]. Was informed that the patient did not have a bed at this time and that there were not any available beds until after 1800. Informed to call back at 1800 to see if there was bed availability at that time. Informed Child psychotherapist, provider and other nurses via secure chat.

## 2022-12-21 NOTE — ED Notes (Signed)
Patient was provided with a salad and juice for lunch 

## 2022-12-21 NOTE — ED Notes (Signed)
Pt is disorganized, having difficulty following directions. Will continue to monitor for safety

## 2022-12-21 NOTE — ED Notes (Signed)
Pt in recliner bed, resting. A/O x4. Denies SI/HI/AVH. He states that he was hearing voices telling him bad things but he believes they are gone now. He is pleasant and engaged. No noted distress. Will continue to monitor for safety
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
## 2022-12-21 NOTE — Progress Notes (Signed)
   12/21/22 0803  BHUC Triage Screening (Walk-ins at [facility] only)  How Did You Hear About [place]? Family/Friend  What Is the Reason for Your Visit/Call Today? Pt is a 62 yo male who present to [facility] voluntarily accompanied by his friend. Pt is seen pacing and singing loudly in the lobby. Pt presents manic with pressured speech. Pt reports that he is healed by God's stripes and his mind is cleaned. Pt was unable to answer questions appropriately. Per pt's friend, pt was seen throwing random items out in his yard. Pt's friend is concerned that pt has not been compliant with his medications.  Pt states, " I missed some days and I don't know." Pt reports that he is not sleeping but unable to share how many hours he is sleeping. Pt denies SI and HI. Pt reports that he has been hearing "evil spirits" but he has rebuked them and now they are gone.  How Long Has This Been Causing You Problems? 1 wk - 1 month  Have You Recently Had Any Thoughts About Hurting Yourself? No  Are You Planning to Commit Suicide/Harm Yourself At This time? No  Have you Recently Had Thoughts About Hurting Someone Karolee Ohs? No  Are You Planning To Harm Someone At This Time? No  Explanation: Pt denies HI  Are you currently experiencing any auditory, visual or other hallucinations? Yes  Please explain the hallucinations you are currently experiencing: Pt reports heaing "evil spirits" over the last 5 days.  Have You Used Any Alcohol or Drugs in the Past 24 Hours? No  What Did You Use and How Much? Pt denies ETOH/ Drug use  Do you have any current medical co-morbidities that require immediate attention? No  Clinician description of patient physical appearance/behavior: Pt was casually dressed and groomed appropriately. Pt is alert, oriented x4 with pressured speech and rapid motor behavior. Eye contact is good. Pt's mood is manic. Thought process is coherent and relevant. Pt's insight is fair and judgement is poor. There is no indication pt is  currently responding to internal stimuli or experiencing delusional thought content. Pt was cooperative throughout assessment .  What Do You Feel Would Help You the Most Today? Treatment for Depression or other mood problem;Medication(s)  If access to [facility] was not available, would you have sought care in the Emergency Department? Yes  Determination of Need Urgent (48 hours)  Options For Referral Inpatient Hospitalization    Flowsheet Row ED from 12/21/2022 in [facility] Most recent reading at 12/21/2022  8:47 AM ED to Hosp-Admission (Discharged) from 12/01/2022 in [facility] Most recent reading at 12/02/2022  8:02 AM ED from 12/01/2022 in [facility] Most recent reading at 12/01/2022  9:55 AM  C-SSRS RISK CATEGORY No Risk No Risk No Risk

## 2022-12-21 NOTE — ED Notes (Signed)
62 year old male presenting to the [facility] today with friend, Jerene Dilling [phone]) for manic type symptoms. Per friend, patient started pulling items out of his house yesterday because he believed his house items were unclean. He has been noted to be putting hand sanitizer on himself, his clothes, where he sits, etc. Due to believing all items are unclean. During assessment/taking vital signs, pt attempted to drink a bottle of hand sanitizer while in the room, but the tech took the hand sanitizer and explained we couldn't do that here. Pt calm and cooperative, denies SI/HI/AVH. Took Ativan as scheduled with no problems. Skin assessment WNL. Will continue to monitor and verbal encouragement provided.

## 2022-12-21 NOTE — ED Notes (Signed)
Patient resting quietly in bed with eyes open, Respirations equal and unlabored, skin warm and dry, NAD. No change in assessment or acuity. Routine safety checks conducted according to facility protocol. Will continue to monitor for safety.   

## 2022-12-21 NOTE — ED Notes (Signed)
Pt gave consent for writer to give his house keys to [employer] for mike to go put his items back in his house.

## 2022-12-22 LAB — HEMOGLOBIN A1C
Hgb A1c MFr Bld: 6 % — ABNORMAL HIGH (ref 4.8–5.6)
Mean Plasma Glucose: 126 mg/dL

## 2022-12-22 NOTE — ED Notes (Signed)
Patient discharge via ambulatory with a steady gait with Safe Transport staff member. Respirations equal and unlabored, skin warm and dry. No acute distress noted.  

## 2022-12-24 ENCOUNTER — Ambulatory Visit (HOSPITAL_COMMUNITY): Payer: Medicaid Other

## 2022-12-28 ENCOUNTER — Inpatient Hospital Stay: Payer: Medicaid Other | Admitting: Family Medicine

## 2022-12-31 ENCOUNTER — Inpatient Hospital Stay: Payer: Medicaid Other | Admitting: Family Medicine

## 2023-01-06 ENCOUNTER — Telehealth (HOSPITAL_COMMUNITY): Payer: Self-pay | Admitting: *Deleted

## 2023-01-06 ENCOUNTER — Ambulatory Visit (HOSPITAL_COMMUNITY): Payer: Medicaid Other | Admitting: *Deleted

## 2023-01-06 NOTE — Telephone Encounter (Signed)
Walked in at 1230 to check on his next appt. Front desk not at the desk due to it being lunch hour. Checked and his next shot is due tomorrow the 20th of June. States he was just in the hospital for "rejuvination" and had his shot and now needs a new appt and also would like to call his case Production designer, theatre/television/film. He has a name on a paper headed PQA, case manager is [employer]. I called number for him and as I pushed Khalils ext [place] said he needed to first go to the store and register his phone, right now he does not have a working phone. States he feels great, will get his phone working and come back this afternoon for [place] to help organize him as he has multiple health meds and possibly a case manager that can help him with his day to day concerns and we will give him an appt in a month for his next shot.

## 2023-01-06 NOTE — Progress Notes (Signed)
In this pm accompanied by his mentor Jerry Carter. He is recently out of the hospital in [place] and has his discharge paperwork and needs help understanding it and his meds along with the need to schedule a future appt for his shot. His paperwork indicates his next shot is due on 7/14 and we scheduled him for July 11 th. Verified his meds from his hospital discharge were sent in to [facility]. His preferred pharmacy is [address] Jerry Jerry Carter is able to take him to pick his meds up and pay for it. He is also getting a med box for him to make it simplier for him as he has 8 different meds. He quit his job and per Jerry Carter who has known him for 25 years he is better but not at his baseline. He has a case Production designer, theatre/television/film per his discharge paperwork. Called him and left a message for him to call Jerry Carter and gave him Jerry Carter new number. Today is a holiday so the office may be closed. His case manager is Jerry Carter at [phone]. Reviewed his medicine with him and info given to Jerry Carter per Jerry Carter approval. Will follow up with him tomorrow after he has hopefully had a chance to speak with his case manager.

## 2023-01-07 ENCOUNTER — Telehealth (HOSPITAL_COMMUNITY): Payer: Self-pay | Admitting: *Deleted

## 2023-01-07 ENCOUNTER — Encounter (HOSPITAL_COMMUNITY): Payer: Medicaid Other | Admitting: Psychiatry

## 2023-01-07 ENCOUNTER — Ambulatory Visit (HOSPITAL_COMMUNITY): Payer: Medicaid Other

## 2023-01-07 NOTE — Telephone Encounter (Signed)
Attempted prior authorization of Abilify Maintena 400mg  again today. Was told again that he has Medicaid Part D and this medication is covered under that plan. Medicaid will not pay until patient can contact Social Services to update his information if he no longer has Medicare D.

## 2023-01-28 ENCOUNTER — Ambulatory Visit (INDEPENDENT_AMBULATORY_CARE_PROVIDER_SITE_OTHER): Payer: MEDICAID | Admitting: Student

## 2023-01-28 ENCOUNTER — Telehealth (HOSPITAL_COMMUNITY): Payer: Self-pay | Admitting: Psychiatry

## 2023-01-28 ENCOUNTER — Other Ambulatory Visit (HOSPITAL_COMMUNITY): Payer: Self-pay | Admitting: Psychiatry

## 2023-01-28 ENCOUNTER — Encounter (HOSPITAL_COMMUNITY): Payer: Self-pay

## 2023-01-28 ENCOUNTER — Ambulatory Visit (INDEPENDENT_AMBULATORY_CARE_PROVIDER_SITE_OTHER): Payer: MEDICAID

## 2023-01-28 VITALS — BP 180/87 | HR 97 | Resp 18 | Ht 70.0 in | Wt 162.4 lb

## 2023-01-28 DIAGNOSIS — Z0000 Encounter for general adult medical examination without abnormal findings: Secondary | ICD-10-CM

## 2023-01-28 DIAGNOSIS — T43505A Adverse effect of unspecified antipsychotics and neuroleptics, initial encounter: Secondary | ICD-10-CM

## 2023-01-28 DIAGNOSIS — F250 Schizoaffective disorder, bipolar type: Secondary | ICD-10-CM

## 2023-01-28 DIAGNOSIS — G2401 Drug induced subacute dyskinesia: Secondary | ICD-10-CM | POA: Diagnosis not present

## 2023-01-28 MED ORDER — VALBENAZINE TOSYLATE 40 MG PO CAPS
40.0000 mg | ORAL_CAPSULE | Freq: Every day | ORAL | 1 refills | Status: DC
Start: 2023-01-28 — End: 2023-03-17

## 2023-01-28 MED ORDER — ARIPIPRAZOLE ER 400 MG IM PRSY
400.0000 mg | PREFILLED_SYRINGE | INTRAMUSCULAR | Status: DC
Start: 2023-01-28 — End: 2023-04-15
  Administered 2023-01-28: 400 mg via INTRAMUSCULAR

## 2023-01-28 NOTE — Progress Notes (Signed)
Patient in today for due Abilify Maintena 400 mg IM every 30 day injection.  Patient seen after meeting with Dr. Alfonse Flavors and Dr. Morrie Sheldon as he presented with appropriate affect, elevated mood and denied any current auditory or visual hallucinations, no suicidal or homicidal ideations and no plans, intent or means to want to harm self or others.  Patient's due injection prepared as ordered and given to patient in his requested Left Deltoid area.  Patient tolerated due injection without complaint of pain or discomfort and agreed to return in 30 days for his next due injection.  Patient also reported plans to follow up with Dr. Neomia Dear, PCP for a scheduled first appointment in August due to concerns for his ongoing increased blood pressure issues.  Patient stated he would bring in his friend who assists him with keeping appointments to also help follow up on medication needs through his Medicare and Medicaid and will call if any issues prior to next appointment.

## 2023-01-28 NOTE — Telephone Encounter (Signed)
Patient and his friend Annette Stable reports that his medications need refills.  He informed Clinical research associate that he does not have a primary care doctor and some of his medications need prior authorizations.  Patient referred to [facility] for primary care.  Patient gets stable of Abilify at next visit.

## 2023-01-28 NOTE — Progress Notes (Signed)
BH MD/PA/NP OP Progress Note  01/28/2023 2:54 PM Jerry Carter  MRN:  [id number]  Chief Complaint:  Chief Complaint  Patient presents with   Follow-up   Other    LAI   HPI: 62 year old male seen today for follow up psychiatric evaluation. He has a psychiatric history of schizoaffective disorder and Neuroleptic-induced tardive dyskinesia . He is currently managed on Prolixin 12.5 mg every 14 days. He notes his medication is effective in manaing his psychiatric conditions.    Today, patient presents well-groomed in a suit, as he went to a job fair prior to coming to clinic. He denies any problems with his LAI, stating the Abilify works well, equally well to the prolixin. 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 He also received other community resources while there. Additionally, he wants to sell art at [employer], as he has done in the past. He wants to be married next year, which is consistent with the plan of two years reported last year. 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    He left employment from [employer] 3 weeks ago, noting difficulty managing his position, when he became ill. He was left with a good reference from the location's owner.  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 He is sleeping well, 5 hours of sleep. Gets "hyper" but only lasts 1-2 days. Appetite and concentration intact. Good mood.  Denies SI, HI, AVH.   AIMS= 10 for mod movement in forehead, mod head bobbing in stooped position, mild perioral movements, and mild jaw clenching. As well, some stiffness of BUE noted.   Compliant with daily Aspirin. Denies concerns about losing job at [employer] due to faith. Considering  [employer] if plans as Haematologist does not work out.     Visit Diagnosis:    ICD-10-CM   1. Schizoaffective disorder, bipolar type (HCC)  F25.0     2. Neuroleptic-induced tardive dyskinesia  G24.01 valbenazine (INGREZZA) 40 MG capsule   T43.505A        Past Psychiatric History: schizoaffective disorder and Neuroleptic-induced tardive dyskinesia   Past Medical History:  Past Medical History:  Diagnosis Date   Bipolar disorder (HCC)    Hypertension    Schizophrenia (HCC)    No past surgical history on file.  Family Psychiatric History: None reported  Family History: No family history on file.  Social History:  Social History   Socioeconomic History   Marital status: Single    Spouse name: Not on file   Number of children: Not on file   Years of education: Not on file   Highest education level: Not on file  Occupational History   Not on file  Tobacco Use   Smoking status: Former    Types: Cigarettes   Smokeless tobacco: Never   Tobacco comments:    Quit smoking over 40 years ago -02/06/2022  Vaping Use   Vaping status: Never Used  Substance and Sexual Activity   Alcohol use: No    Alcohol/week: 0.0 standard drinks of alcohol   Drug use: No   Sexual activity: Not Currently  Other Topics Concern   Not on file  Social History Narrative  Not on file   Social Determinants of Health   Financial Resource Strain: Not on file  Food Insecurity: Food Insecurity Present (12/02/2022)   Hunger Vital Sign    Worried About Running Out of Food in the Last Year: Often true    Ran Out of Food in the Last Year: Often true  Transportation Needs: No Transportation Needs (12/02/2022)   PRAPARE - Administrator, Civil Service (Medical): No    Lack of Transportation (Non-Medical): No  Physical Activity: Not on file  Stress: Not on file  Social Connections: Not on file    Allergies: No Known Allergies  Metabolic Disorder Labs: Lab Results  Component Value Date    HGBA1C 6.0 (H) 12/21/2022   MPG 126 12/21/2022   MPG 119.76 12/02/2022   Lab Results  Component Value Date   PROLACTIN 20.9 (H) 04/30/2022   Lab Results  Component Value Date   CHOL 119 12/21/2022   TRIG 32 12/21/2022   HDL 47 12/21/2022   CHOLHDL 2.5 12/21/2022   VLDL 6 12/21/2022   LDLCALC 66 12/21/2022   LDLCALC 91 12/02/2022   Lab Results  Component Value Date   TSH 1.893 12/21/2022   TSH 1.110 11/26/2022    Therapeutic Level Labs: Lab Results  Component Value Date   LITHIUM 0.53 (L) 03/03/2014   Lab Results  Component Value Date   VALPROATE 113.4 (H) 02/22/2014   No results found for: "CBMZ"  Current Medications: Current Outpatient Medications  Medication Sig Dispense Refill   ARIPiprazole ER (ABILIFY MAINTENA) 400 MG PRSY prefilled syringe Inject 400 mg into the muscle every 30 (thirty) days. 1 each 11   aspirin EC 81 MG tablet Take 1 tablet (81 mg total) by mouth daily. Swallow whole. 120 tablet 2   atorvastatin (LIPITOR) 40 MG tablet Take 1 tablet (40 mg total) by mouth daily. 30 tablet 0   losartan (COZAAR) 100 MG tablet Take 1 tablet (100 mg total) by mouth daily. 30 tablet 0   valbenazine (INGREZZA) 40 MG capsule Take 1 capsule (40 mg total) by mouth daily. 30 capsule 1   acetaminophen (TYLENOL) 325 MG tablet Take 2 tablets (650 mg total) by mouth every 4 (four) hours as needed for mild pain (or temp > 37.5 C (99.5 F)). (Patient not taking: Reported on 01/29/2023) 100 tablet 0   clopidogrel (PLAVIX) 75 MG tablet Take 1 tablet (75 mg total) by mouth daily. (Patient not taking: Reported on 01/29/2023) 21 tablet 0   senna-docusate (SENOKOT-S) 8.6-50 MG tablet Take 1 tablet by mouth at bedtime as needed for moderate constipation or mild constipation. (Patient not taking: Reported on 12/21/2022) 30 tablet 0   Current Facility-Administered Medications  Medication Dose Route Frequency Provider Last Rate Last Admin   ARIPiprazole ER (ABILIFY MAINTENA) 400 MG  prefilled syringe 400 mg  400 mg Intramuscular Q30 days Lamar Sprinkles, MD   400 mg at 01/28/23 1540     Musculoskeletal: Strength & Muscle Tone: within normal limits Gait & Station: normal Patient leans: N/A  Psychiatric Specialty Exam: Review of Systems  Constitutional:  Negative for activity change and unexpected weight change.       Denies akathisia  Cardiovascular:  Negative for chest pain.  Gastrointestinal: Negative.   Neurological:  Negative for dizziness, seizures and headaches.    There were no vitals taken for this visit.There is no height or weight on file to calculate BMI.  General Appearance: Well Groomed  Eye Contact:  Good  Speech:  Clear and Coherent and Normal Rate  Volume:  Normal  Mood:  Euphoric  Affect:  Congruent  Thought Process:  Coherent, Goal Directed, and Linear  Orientation:  Full (Time, Place, and Person)  Thought Content: WDL, Logical, and mostly logical, some grandiose statements    Suicidal Thoughts:  No  Homicidal Thoughts:  No  Memory:  Immediate;   Good Recent;   Good Remote;   Good  Judgement:  Good  Insight:  Good  Psychomotor Activity:  Normal  Concentration:  Concentration: Good and Attention Span: Good  Recall:  Good  Fund of Knowledge: Good  Language: Good  Akathisia:  No  Handed:  Right  AIMS (if indicated): 10; see HPI  Assets:  Communication Skills Desire for Improvement Financial Resources/Insurance Housing Physical Health Social Support  ADL's:  Intact  Cognition: WNL  Sleep:  Good   Screenings: AIMS    Flowsheet Row Office Visit from 07/23/2022 in [facility] Office Visit from 02/06/2022 in [facility]  AIMS Total Score 0 6      AUDIT    Flowsheet Row [facility]  Alcohol Use Disorder Identification Test Final Score (AUDIT) 0      GAD-7    Flowsheet Row Office Visit from  04/30/2022 in [facility]  Total GAD-7 Score 0      PHQ2-9    Flowsheet Row Office Visit from 01/28/2023 in [facility] Office Visit from 04/30/2022 in [facility] Office Visit from 12/09/2017 in [facility] Office Visit from 04/16/2017 in [facility] Office Visit from 03/04/2017 in [facility]  PHQ-2 Total Score 0 0 0 0 0      Flowsheet Row ED [facility] Most recent reading at 12/21/2022  8:47 AM ED to Hosp-Admission (Discharged) from 12/01/2022 in [facility] Most recent reading at 12/02/2022  8:02 AM ED from 12/01/2022 in [facility] Most recent reading at 12/01/2022  9:55 AM  C-SSRS RISK CATEGORY No Risk No Risk No Risk        Assessment and Plan: Patient reports that his anxiety and depression are well managed. He denies symptoms of psychosis today. Although he appears more manic, grandiose, and hyperreligious than previous reporting, he is able to rationalize that time management is going to be important for him to complete all of his goals. His sleep is also not affected by his goal-setting. He notes that his TD has been affecting him after previous improvements. Noted signs of TD today: Involuntary movements of forehead, particularly right side; involuntary perioral movements; involuntary head bobbing as he stood and leaned forward. These movements did not dissipate with distraction.  Discussed with patient how his prolactin levels were not considerably elevated and assessed whether he felt better taking the prolixin or Abilify. Patient opted to continue the latter, noting that they have been equivalent in efficacy. Patient agreeable to start Ingrezza 40 mg for his TD symptoms, without further medication changes today. Patient is compliant with his  daily aspirin and has neurology follow-up in the near future. He is also agreeable to establishing with a primary psychiatrist and a primary physician for HTN management.  1. Schizoaffective disorder, bipolar type (HCC)   Continue- ARIPiprazole ER (ABILIFY MAINTENA) 400 MG prefilled syringe  400 mg Start- ARIPiprazole ER (ABILIFY MAINTENA) 400 MG PRSY prefilled syringe; Inject 400 mg into the muscle every 30 (thirty) days.  Dispense: 1 each; Refill: 11 START Ingrezza 40 mg daily. Dispense: 30; Refill: 1  Collaboration of Care: Collaboration of Care: Other provider involved in patient's care AEB PCP and shot clinic staff  Patient/Guardian was advised Release of Information must be obtained prior to any record release in order to collaborate their care with an outside provider. Patient/Guardian was advised if they have not already done so to contact the registration department to sign all necessary forms in order for [place] to release information regarding their care.   Consent: Patient/Guardian gives verbal consent for treatment and assignment of benefits for services provided during this visit. Patient/Guardian expressed understanding and agreed to proceed.   Follow up in 4 weeks for shot clinic Follow up with primary psychiatric provider, date pending   Lamar Sprinkles, MD 01/28/2023, 2:54 PM

## 2023-02-01 NOTE — Addendum Note (Signed)
Addended by: Theodoro Kos A on: 02/01/2023 11:20 AM   Modules accepted: Level of Service

## 2023-02-06 ENCOUNTER — Other Ambulatory Visit (HOSPITAL_BASED_OUTPATIENT_CLINIC_OR_DEPARTMENT_OTHER): Payer: Self-pay

## 2023-02-06 MED ORDER — CLOPIDOGREL BISULFATE 75 MG PO TABS
75.0000 mg | ORAL_TABLET | Freq: Every day | ORAL | 1 refills | Status: DC
  Filled 2023-02-06: qty 30, 30d supply, fill #0

## 2023-02-06 MED ORDER — LOSARTAN POTASSIUM 100 MG PO TABS
100.0000 mg | ORAL_TABLET | Freq: Every day | ORAL | 1 refills | Status: DC
  Filled 2023-02-06: qty 30, 30d supply, fill #0

## 2023-02-06 MED ORDER — SENNOSIDES-DOCUSATE SODIUM 8.6-50 MG PO TABS
1.0000 | ORAL_TABLET | ORAL | 0 refills | Status: DC | PRN
  Filled 2023-02-06: qty 30, 30d supply, fill #0

## 2023-02-06 MED ORDER — DIVALPROEX SODIUM ER 500 MG PO TB24
500.0000 mg | ORAL_TABLET | Freq: Two times a day (BID) | ORAL | 1 refills | Status: DC
  Filled 2023-02-06: qty 60, 30d supply, fill #0

## 2023-02-06 MED ORDER — ATORVASTATIN CALCIUM 40 MG PO TABS
40.0000 mg | ORAL_TABLET | Freq: Every day | ORAL | 1 refills | Status: DC
  Filled 2023-02-06: qty 30, 30d supply, fill #0

## 2023-02-06 MED ORDER — HYDROCHLOROTHIAZIDE 25 MG PO TABS
12.5000 mg | ORAL_TABLET | Freq: Every day | ORAL | 1 refills | Status: DC
Start: 2023-01-04 — End: 2023-04-15
  Filled 2023-02-06: qty 15, 30d supply, fill #0
  Filled 2023-03-05: qty 15, 30d supply, fill #1
  Filled 2023-04-09: qty 15, 30d supply, fill #2

## 2023-02-06 MED ORDER — TRAZODONE HCL 100 MG PO TABS
100.0000 mg | ORAL_TABLET | Freq: Every day | ORAL | 1 refills | Status: DC
  Filled 2023-02-06: qty 3, 3d supply, fill #0

## 2023-02-06 MED ORDER — ABILIFY MAINTENA 400 MG IM PRSY
400.0000 mg | PREFILLED_SYRINGE | INTRAMUSCULAR | 1 refills | Status: DC
  Filled 2023-02-06: qty 1, 30d supply, fill #0

## 2023-02-06 MED ORDER — ASPIRIN 81 MG PO TBEC
81.0000 mg | DELAYED_RELEASE_TABLET | Freq: Every day | ORAL | 1 refills | Status: DC
  Filled 2023-02-06: qty 30, 30d supply, fill #0

## 2023-02-06 MED ORDER — METOPROLOL SUCCINATE ER 50 MG PO TB24
50.0000 mg | ORAL_TABLET | Freq: Every day | ORAL | 1 refills | Status: DC
  Filled 2023-02-06: qty 30, 30d supply, fill #0

## 2023-02-07 ENCOUNTER — Other Ambulatory Visit (HOSPITAL_BASED_OUTPATIENT_CLINIC_OR_DEPARTMENT_OTHER): Payer: Self-pay

## 2023-02-08 ENCOUNTER — Other Ambulatory Visit (HOSPITAL_BASED_OUTPATIENT_CLINIC_OR_DEPARTMENT_OTHER): Payer: Self-pay

## 2023-02-17 NOTE — Progress Notes (Unsigned)
[facility] [address] [place] [address] [phone]       HOSPITAL FOLLOW UP NOTE  Jerry Carter Date of Birth:  October 02, 1960 Medical Record Number:  [id number]   Reason for Referral:  hospital stroke follow up    SUBJECTIVE:   CHIEF COMPLAINT:  No chief complaint on file.   HPI:   Mr. Jerry Carter is a 62 year old with hypertension with medication noncompliance and schizophrenia as well as bipolar disorder who presented to ED on 12/01/2022 with dizziness and transient episode of slurred speech and hypertensive urgency on arrival.  MRI negative for stroke.  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  Recommended DAPT for 3 weeks and aspirin alone and initiated atorvastatin 40 mg daily, LDL 81.  Importance of antihypertensive compliance with losartan and added Cozaar.      PERTINENT IMAGING  CT head/CTA head & neck: CT negative for acute abnormality. 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 MRI: No acute intracranial abnormality identified. 2D Echo: LVEF 60 to 65%, mild concentric LVH, mild MVR, mild AVR, no shunt. LDL 81 HgbA1c 5.8 UDS neg    ROS:   14 system review of systems performed and negative with exception of ***  PMH:  Past Medical History:  Diagnosis Date   Bipolar disorder (HCC)    Hypertension    Schizophrenia (HCC)     PSH: No past surgical history on file.  Social History:  Social History   Socioeconomic History   Marital status: Single    Spouse name: Not on file   Number of children: Not on file   Years of education: Not on file   Highest education level: Not on file  Occupational History   Not on file  Tobacco Use   Smoking status: Former    Types: Cigarettes   Smokeless tobacco: Never   Tobacco comments:    Quit smoking over 40 years  ago -02/06/2022  Vaping Use   Vaping status: Never Used  Substance and Sexual Activity   Alcohol use: No    Alcohol/week: 0.0 standard drinks of alcohol   Drug use: No   Sexual activity: Not Currently  Other Topics Concern   Not on file  Social History Narrative   Not on file   Social Determinants of Health   Financial Resource Strain: Not on file  Food Insecurity: Food Insecurity Present (12/02/2022)   Hunger Vital Sign    Worried About Running Out of Food in the Last Year: Often true    Ran Out of Food in the Last Year: Often true  Transportation Needs: No Transportation Needs (12/02/2022)   PRAPARE - Administrator, Civil Service (Medical): No    Lack of Transportation (Non-Medical): No  Physical Activity: Not on file  Stress: Not on file  Social Connections: Not on file  Intimate Partner Violence: Not At Risk (12/02/2022)   Humiliation, Afraid, Rape, and Kick questionnaire    Fear of Current or Ex-Partner: No    Emotionally Abused: No    Physically Abused: No    Sexually Abused: No    Family History: No family history on file.  Medications:   Current Outpatient Medications on File Prior to Visit  Medication Sig Dispense Refill   acetaminophen (TYLENOL) 325 MG tablet Take 2 tablets (650 mg total) by mouth every 4 (four) hours as needed for mild  pain (or temp > 37.5 C (99.5 F)). (Patient not taking: Reported on 01/29/2023) 100 tablet 0   ARIPiprazole ER (ABILIFY MAINTENA) 400 MG PRSY prefilled syringe Inject 400 mg into the muscle every 30 (thirty) days. 1 each 11   ARIPiprazole ER (ABILIFY MAINTENA) 400 MG PRSY prefilled syringe Inject 400 mg into the muscle every 30 (thirty) days. Next dose 01/31/23 1 each 1   aspirin EC 81 MG tablet Take 1 tablet (81 mg total) by mouth daily. Swallow whole. 120 tablet 2   aspirin EC 81 MG tablet Take 1 tablet (81 mg total) by mouth daily. 30 tablet 1   atorvastatin (LIPITOR) 40 MG tablet Take 1 tablet (40 mg total) by mouth  daily. 30 tablet 1   clopidogrel (PLAVIX) 75 MG tablet Take 1 tablet (75 mg total) by mouth daily. 30 tablet 1   divalproex (DEPAKOTE ER) 500 MG 24 hr tablet Take 1 tablet (500 mg total) by mouth 2 (two) times daily. 60 tablet 1   hydrochlorothiazide (HYDRODIURIL) 25 MG tablet Take 0.5 tablets (12.5 mg total) by mouth daily. 30 tablet 1   losartan (COZAAR) 100 MG tablet Take 1 tablet (100 mg total) by mouth daily. 30 tablet 1   metoprolol succinate (TOPROL-XL) 50 MG 24 hr tablet Take 1 tablet (50 mg total) by mouth daily. 30 tablet 1   senna-docusate (SENOKOT-S) 8.6-50 MG tablet Take 1 tablet by mouth at bedtime as needed for moderate constipation or mild constipation. (Patient not taking: Reported on 12/21/2022) 30 tablet 0   senna-docusate (SENOKOT-S) 8.6-50 MG tablet Take 1 tablet by mouth in the evening as needed for constipation. 30 tablet 0   traZODone (DESYREL) 100 MG tablet Take 1 tablet (100 mg total) by mouth at bedtime. 3 tablet 1   valbenazine (INGREZZA) 40 MG capsule Take 1 capsule (40 mg total) by mouth daily. 30 capsule 1   [DISCONTINUED] candesartan (ATACAND) 4 MG tablet Take 1 tablet (4 mg total) by mouth daily. 30 tablet 2   Current Facility-Administered Medications on File Prior to Visit  Medication Dose Route Frequency Provider Last Rate Last Admin   ARIPiprazole ER (ABILIFY MAINTENA) 400 MG prefilled syringe 400 mg  400 mg Intramuscular Q30 days Lamar Sprinkles, MD   400 mg at 01/28/23 1540    Allergies:  No Known Allergies    OBJECTIVE:  Physical Exam  There were no vitals filed for this visit. There is no height or weight on file to calculate BMI. No results found.     01/28/2023    3:13 PM  Depression screen PHQ 2/9  Decreased Interest   Down, Depressed, Hopeless   PHQ - 2 Score      Information is confidential and restricted. Go to Review Flowsheets to unlock data.     General: well developed, well nourished, seated, in no evident distress Head: head  normocephalic and atraumatic.   Neck: supple with no carotid or supraclavicular bruits Cardiovascular: regular rate and rhythm, no murmurs Musculoskeletal: no deformity Skin:  no rash/petichiae Vascular:  Normal pulses all extremities   Neurologic Exam Mental Status: Awake and fully alert. Oriented to place and time. Recent and remote memory intact. Attention span, concentration and fund of knowledge appropriate. Mood and affect appropriate.  Cranial Nerves: Fundoscopic exam reveals sharp disc margins. Pupils equal, briskly reactive to light. Extraocular movements full without nystagmus. Visual fields full to confrontation. Hearing intact. Facial sensation intact. Face, tongue, palate moves normally and symmetrically.  Motor: Normal bulk and  tone. Normal strength in all tested extremity muscles Sensory.: intact to touch , pinprick , position and vibratory sensation.  Coordination: Rapid alternating movements normal in all extremities. Finger-to-nose and heel-to-shin performed accurately bilaterally. Gait and Station: Arises from chair without difficulty. Stance is normal. Gait demonstrates normal stride length and balance with ***. Tandem walk and heel toe ***.  Reflexes: 1+ and symmetric. Toes downgoing.     NIHSS  *** Modified Rankin  ***      ASSESSMENT: Jerry Carter is a 62 y.o. 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 Vascular risk factors include uncontrolled HTN with medication noncompliance, HLD and former tobacco use.      PLAN:  TIA:  Residual deficit: ***.  Continue aspirin 81mg  daily and atorvastatin (Lipitor) for secondary stroke prevention.   Discussed secondary stroke prevention measures and importance of establishing care with PCP for routine follow up for aggressive stroke risk factor management including BP goal<130/90,  and HLD with LDL goal<70 Stroke labs 11/2022: LDL 81, A1c 5.8 I have gone over the pathophysiology of stroke, warning signs and symptoms, risk factors and their management in some detail with instructions to go to the closest emergency room for symptoms of concern.     Follow up in *** or call earlier if needed   CC:  [facility] provider: Dr. Pearlean Brownie PCP: Pcp, No    I spent *** minutes of face-to-face and non-face-to-face time with patient.  This included previsit chart review including review of recent hospitalization, lab review, study review, order entry, electronic health record documentation, patient education regarding recent stroke including etiology, secondary stroke prevention measures and importance of managing stroke risk factors, residual deficits and typical recovery time and answered all other questions to patient satisfaction   Ihor Austin, AGNP-BC  [facility] [address] [place], [place] [address]  Phone [phone] Fax [phone] Note: This document was prepared with digital dictation and possible smart phrase technology. Any transcriptional errors that result from this process are unintentional.

## 2023-02-18 ENCOUNTER — Encounter: Payer: Self-pay | Admitting: Adult Health

## 2023-02-18 ENCOUNTER — Telehealth: Payer: Self-pay | Admitting: Adult Health

## 2023-02-18 ENCOUNTER — Ambulatory Visit (INDEPENDENT_AMBULATORY_CARE_PROVIDER_SITE_OTHER): Payer: MEDICAID | Admitting: Adult Health

## 2023-02-18 VITALS — BP 149/71 | HR 56 | Ht 70.0 in | Wt 163.0 lb

## 2023-02-18 DIAGNOSIS — G459 Transient cerebral ischemic attack, unspecified: Secondary | ICD-10-CM

## 2023-02-18 NOTE — Telephone Encounter (Signed)
AVS has been e-mailed to  [email or web address] At pt's request.

## 2023-02-18 NOTE — Telephone Encounter (Signed)
Pt is asking that a copy of his AVS be sent to his mentors by email 1: [email or web address] 2. [email or web address]

## 2023-02-18 NOTE — Patient Instructions (Signed)
Continue aspirin 81 mg daily  and atorvastatin 40 mg daily for secondary stroke prevention and continue current blood pressure medications. Please ensure you are taking these medications as prescribed as this will decrease risk of future strokes  Please stop Plavix (clopidogrel) at this time   Would recommend routine monitoring of blood pressure at home  Please ensure you establish care with a primary care provider (PCP) for routine follow up and management of stroke risk factors including cholesterol and blood pressure Maintain strict control of hypertension with blood pressure goal below 130/90 and cholesterol with LDL cholesterol (bad cholesterol) goal below 70 mg/dL.   Signs of a Stroke? Follow the BEFAST method:  Balance Watch for a sudden loss of balance, trouble with coordination or vertigo Eyes Is there a sudden loss of vision in one or both eyes? Or double vision?  Face: Ask the person to smile. Does one side of the face droop or is it numb?  Arms: Ask the person to raise both arms. Does one arm drift downward? Is there weakness or numbness of a leg? Speech: Ask the person to repeat a simple phrase. Does the speech sound slurred/strange? Is the person confused ? Time: If you observe any of these signs, call 911.       Thank you for coming to see [place] at [facility]. I hope we have been able to provide you high quality care today.  You may receive a patient satisfaction survey over the next few weeks. We would appreciate your feedback and comments so that we may continue to improve ourselves and the health of our patients.    Transient Ischemic Attack A transient ischemic attack (TIA) causes the same symptoms as a stroke, but the symptoms go away quickly. A TIA happens when blood flow to the brain is blocked. Having a TIA means you may be at risk for a stroke. A TIA is a medical emergency. What are the causes? A TIA is caused by a blocked artery in the head or  neck. This means the brain does not get the blood supply it needs. A blockage can be caused by: Fatty buildup in an artery in the head or neck. A blood clot. A tear in an artery. Irritation and swelling (inflammation) of an artery. Sometimes the cause is not known. What increases the risk? Certain things may make you more likely to have a TIA. Some of these are things that you can change, such as: Using products that have nicotine or tobacco. Not being active. Drinking too much alcohol. Using recreational drugs. Health conditions that may increase your risk include: High blood pressure. High cholesterol. Diabetes. Heart disease. A heartbeat that is not regular (atrial fibrillation). Sickle cell disease. Problems with blood clotting. Other risk factors include: Being over the age of 18. Being male. Being very overweight. Sleep problems (sleep apnea). Having a family history of stroke. Having had blood clots, stroke, TIA, or heart attack in the past. What are the signs or symptoms? The symptoms of a TIA are like those of a stroke. They can include: Weakness or loss of feeling in your face, arm, or leg. This often happens on one side of your body. Trouble walking. Trouble moving your arms or legs. Trouble talking or understanding what people are saying. Problems with how you see. Feeling dizzy. Feeling confused. Loss of balance or coordination. Feeling like you may vomit (nausea) or vomiting. Having a very bad headache. If you can, note what time you  started to have symptoms. Tell your doctor. How is this treated? The goal of treatment is to lower the risk for a stroke. This may include: Changes to diet and lifestyle, such as getting regular exercise and stopping smoking. Taking medicines to: Thin the blood. Lower blood pressure. Lower cholesterol. Treating other health conditions, such as diabetes. If testing shows that an artery in your brain is narrow, your doctor may  recommend a procedure to: Take the blockage out of your artery. Open or widen an artery in your neck (carotid angioplasty and stenting). Follow these instructions at home: Medicines Take over-the-counter and prescription medicines only as told by your doctor. If you were told to take aspirin or another medicine to thin your blood, use it exactly as told by your doctor. Taking too much of the medicine can cause bleeding. Taking too little of the medicine may not work to treat the problem. Eating and drinking  Eat 5 or more servings of fruits and vegetables each day. Follow instructions from your doctor about your diet. You may need to follow a certain diet to help lower your risk of a stroke. You may need to: Eat a diet that is low in fat and salt. Eat foods with a lot of fiber. Limit carbohydrates and sugar. If you drink alcohol: Limit how much you have to: 0-1 drink a day for women who are not pregnant. 0-2 drinks a day for men. Know how much alcohol is in a drink. In the [place] one drink equals one 12 oz bottle of beer (355 mL), one 5 oz glass of wine (148 mL), or one 1 oz glass of hard liquor (44 mL). General instructions Keep a healthy weight. Try to get at least 30 minutes of exercise on most days. Get treatment if you have sleep problems. Do not smoke or use any products that contain nicotine or tobacco. If you need help quitting, ask your doctor. Do not use drugs. Keep all follow-up visits. Your doctor will want to know if you have any more symptoms and to check blood labs if any medicines were prescribed. Where to find more information American Stroke Association: stroke.org Get help right away if: You have chest pain. You have a heartbeat that is not regular. You have any signs of a stroke. "BE FAST" is an easy way to remember the main warning signs: B - Balance. Dizziness, sudden trouble walking, or loss of balance. E - Eyes. Trouble seeing or a change in how you see. F  - Face. Sudden weakness or loss of feeling of the face. The face or eyelid may droop on one side. A - Arms. Weakness or loss of feeling in an arm. This happens all of a sudden and most often on one side of the body. S - Speech. Sudden trouble speaking, slurred speech, or trouble understanding what people say. T - Time. Time to call emergency services. Write down what time symptoms started. You have other signs of a stroke, such as: A sudden, very bad headache with no known cause. Feeling like you may vomit. Vomiting. A seizure. These symptoms may be an emergency. Get help right away. Call 911. Do not wait to see if the symptoms will go away. Do not drive yourself to the hospital. This information is not intended to replace advice given to you by your health care provider. Make sure you discuss any questions you have with your health care provider. Document Revised: 12/19/2021 Document Reviewed: 12/19/2021 Elsevier Patient Education  2024 Elsevier Inc.

## 2023-02-19 ENCOUNTER — Other Ambulatory Visit: Payer: Self-pay | Admitting: Internal Medicine

## 2023-02-25 ENCOUNTER — Ambulatory Visit (HOSPITAL_COMMUNITY): Payer: MEDICAID

## 2023-02-25 ENCOUNTER — Encounter (HOSPITAL_COMMUNITY): Payer: Self-pay

## 2023-02-25 ENCOUNTER — Ambulatory Visit (INDEPENDENT_AMBULATORY_CARE_PROVIDER_SITE_OTHER): Payer: MEDICAID

## 2023-02-25 VITALS — BP 140/57 | HR 90 | Ht 70.0 in | Wt 163.8 lb

## 2023-02-25 DIAGNOSIS — F411 Generalized anxiety disorder: Secondary | ICD-10-CM

## 2023-02-25 DIAGNOSIS — F200 Paranoid schizophrenia: Secondary | ICD-10-CM

## 2023-02-25 DIAGNOSIS — G4700 Insomnia, unspecified: Secondary | ICD-10-CM

## 2023-02-25 NOTE — Progress Notes (Cosign Needed)
PATIENT PRESENTS TO THE OFFICE FOR ABILIFY 400 INJECTION , PT TOLERATED INJECTION WELL IN LEFT DELTOID BY Mykelle Cockerell , PT IS TO RETURN IN 28 DAYS

## 2023-03-05 ENCOUNTER — Other Ambulatory Visit (HOSPITAL_BASED_OUTPATIENT_CLINIC_OR_DEPARTMENT_OTHER): Payer: Self-pay

## 2023-03-05 MED ORDER — DIVALPROEX SODIUM 500 MG PO DR TAB
500.0000 mg | DELAYED_RELEASE_TABLET | Freq: Every evening | ORAL | 5 refills | Status: DC
  Filled 2023-03-05: qty 30, 30d supply, fill #0

## 2023-03-05 MED ORDER — TRAZODONE HCL 100 MG PO TABS
100.0000 mg | ORAL_TABLET | Freq: Every day | ORAL | 5 refills | Status: DC
  Filled 2023-03-05 – 2023-03-06 (×3): qty 30, 30d supply, fill #0
  Filled 2023-04-20: qty 30, 30d supply, fill #1
  Filled 2023-05-28: qty 30, 30d supply, fill #2

## 2023-03-05 MED ORDER — ATORVASTATIN CALCIUM 40 MG PO TABS
40.0000 mg | ORAL_TABLET | Freq: Every evening | ORAL | 5 refills | Status: DC
  Filled 2023-03-05: qty 30, 30d supply, fill #0

## 2023-03-05 MED ORDER — ATORVASTATIN CALCIUM 40 MG PO TABS
40.0000 mg | ORAL_TABLET | Freq: Every day | ORAL | 6 refills | Status: DC
Start: 2023-03-05 — End: 2023-10-29
  Filled 2023-03-05 – 2023-03-06 (×2): qty 30, 30d supply, fill #0
  Filled 2023-04-09 (×2): qty 30, 30d supply, fill #1
  Filled 2023-05-28: qty 30, 30d supply, fill #2
  Filled 2023-07-02 – 2023-07-05 (×2): qty 30, 30d supply, fill #3
  Filled 2023-07-29: qty 30, 30d supply, fill #4
  Filled 2023-08-09 – 2023-09-06 (×2): qty 30, 30d supply, fill #5
  Filled 2023-09-29 – 2023-10-05 (×2): qty 30, 30d supply, fill #6
  Filled 2023-10-05: qty 30, 30d supply, fill #0

## 2023-03-05 MED ORDER — ASPIRIN 81 MG PO TBEC
81.0000 mg | DELAYED_RELEASE_TABLET | Freq: Every day | ORAL | 5 refills | Status: DC
  Filled 2023-03-05: qty 30, 30d supply, fill #0
  Filled 2023-04-20: qty 30, 30d supply, fill #1
  Filled 2023-05-28: qty 30, 30d supply, fill #2
  Filled 2023-07-02 – 2023-07-05 (×2): qty 30, 30d supply, fill #3
  Filled 2023-07-29: qty 30, 30d supply, fill #4
  Filled 2023-08-09 – 2023-09-06 (×2): qty 30, 30d supply, fill #5

## 2023-03-05 MED ORDER — METOPROLOL SUCCINATE ER 50 MG PO TB24
50.0000 mg | ORAL_TABLET | Freq: Every day | ORAL | 5 refills | Status: DC
Start: 2023-03-05 — End: 2023-09-30
  Filled 2023-03-05: qty 30, 30d supply, fill #0
  Filled 2023-04-20: qty 30, 30d supply, fill #1
  Filled 2023-05-28: qty 30, 30d supply, fill #2
  Filled 2023-07-02 – 2023-07-05 (×2): qty 30, 30d supply, fill #3
  Filled 2023-07-29: qty 30, 30d supply, fill #4
  Filled 2023-08-09 – 2023-09-06 (×2): qty 30, 30d supply, fill #5

## 2023-03-05 MED ORDER — LOSARTAN POTASSIUM 25 MG PO TABS
25.0000 mg | ORAL_TABLET | Freq: Every day | ORAL | 5 refills | Status: DC
  Filled 2023-03-05: qty 30, 30d supply, fill #0

## 2023-03-05 MED ORDER — LOSARTAN POTASSIUM 100 MG PO TABS
100.0000 mg | ORAL_TABLET | Freq: Every day | ORAL | 6 refills | Status: DC
  Filled 2023-03-05 – 2023-03-06 (×2): qty 30, 30d supply, fill #0
  Filled 2023-04-20: qty 30, 30d supply, fill #1

## 2023-03-05 MED ORDER — DIVALPROEX SODIUM ER 500 MG PO TB24
500.0000 mg | ORAL_TABLET | Freq: Two times a day (BID) | ORAL | 6 refills | Status: DC
  Filled 2023-03-05 – 2023-03-06 (×2): qty 60, 30d supply, fill #0
  Filled 2023-04-20: qty 60, 30d supply, fill #1
  Filled 2023-05-28: qty 60, 30d supply, fill #2

## 2023-03-06 ENCOUNTER — Other Ambulatory Visit (HOSPITAL_BASED_OUTPATIENT_CLINIC_OR_DEPARTMENT_OTHER): Payer: Self-pay

## 2023-03-06 ENCOUNTER — Other Ambulatory Visit (HOSPITAL_COMMUNITY): Payer: Self-pay

## 2023-03-08 ENCOUNTER — Other Ambulatory Visit (HOSPITAL_BASED_OUTPATIENT_CLINIC_OR_DEPARTMENT_OTHER): Payer: Self-pay

## 2023-03-17 ENCOUNTER — Encounter (HOSPITAL_COMMUNITY): Payer: Self-pay | Admitting: Psychiatry

## 2023-03-17 ENCOUNTER — Emergency Department (HOSPITAL_COMMUNITY)
Admission: EM | Admit: 2023-03-17 | Discharge: 2023-03-18 | Disposition: A | Discharge status: Home / Self Care | Payer: MEDICAID | Attending: Emergency Medicine | Admitting: Emergency Medicine

## 2023-03-17 DIAGNOSIS — F3010 Manic episode without psychotic symptoms, unspecified: Secondary | ICD-10-CM | POA: Insufficient documentation

## 2023-03-17 DIAGNOSIS — T43505A Adverse effect of unspecified antipsychotics and neuroleptics, initial encounter: Secondary | ICD-10-CM

## 2023-03-17 DIAGNOSIS — F250 Schizoaffective disorder, bipolar type: Secondary | ICD-10-CM | POA: Diagnosis not present

## 2023-03-17 DIAGNOSIS — Z87891 Personal history of nicotine dependence: Secondary | ICD-10-CM | POA: Insufficient documentation

## 2023-03-17 DIAGNOSIS — G2401 Drug induced subacute dyskinesia: Secondary | ICD-10-CM

## 2023-03-17 DIAGNOSIS — Z20822 Contact with and (suspected) exposure to covid-19: Secondary | ICD-10-CM | POA: Diagnosis not present

## 2023-03-17 DIAGNOSIS — Z7982 Long term (current) use of aspirin: Secondary | ICD-10-CM | POA: Insufficient documentation

## 2023-03-17 DIAGNOSIS — I10 Essential (primary) hypertension: Secondary | ICD-10-CM | POA: Diagnosis not present

## 2023-03-17 DIAGNOSIS — Z79899 Other long term (current) drug therapy: Secondary | ICD-10-CM | POA: Insufficient documentation

## 2023-03-17 LAB — ETHANOL: Alcohol, Ethyl (B): <10 mg/dL (ref <10)

## 2023-03-17 LAB — COMPREHENSIVE METABOLIC PANEL
ALT: 51 U/L — ABNORMAL HIGH (ref 0–44)
AST: 58 U/L — ABNORMAL HIGH (ref 15–41)
Albumin: 3.3 g/dL — ABNORMAL LOW (ref 3.5–5.0)
Alkaline Phosphatase: 73 U/L (ref 38–126)
Anion gap: 11 (ref 5–15)
BUN: 26 mg/dL — ABNORMAL HIGH (ref 8–23)
CO2: 23 mmol/L (ref 22–32)
Calcium: 8.4 mg/dL — ABNORMAL LOW (ref 8.9–10.3)
Chloride: 101 mmol/L (ref 98–111)
Creatinine, Ser: 1.43 mg/dL — ABNORMAL HIGH (ref 0.61–1.24)
GFR, Estimated: 55 mL/min — ABNORMAL LOW (ref >60)
Glucose, Bld: 99 mg/dL (ref 70–99)
Potassium: 4 mmol/L (ref 3.5–5.1)
Sodium: 135 mmol/L (ref 135–145)
Total Bilirubin: 0.6 mg/dL (ref 0.3–1.2)
Total Protein: 6.4 g/dL — ABNORMAL LOW (ref 6.5–8.1)

## 2023-03-17 LAB — CBC WITH DIFFERENTIAL/PLATELET
Abs Immature Granulocytes: 0 10*3/uL (ref 0.00–0.07)
Basophils Absolute: 0 10*3/uL (ref 0.0–0.1)
Basophils Relative: 0 %
Eosinophils Absolute: 0.1 10*3/uL (ref 0.0–0.5)
Eosinophils Relative: 3 %
HCT: 40.3 % (ref 39.0–52.0)
Hemoglobin: 12.7 g/dL — ABNORMAL LOW (ref 13.0–17.0)
Immature Granulocytes: 0 %
Lymphocytes Relative: 25 %
Lymphs Abs: 0.7 10*3/uL (ref 0.7–4.0)
MCH: 27.5 pg (ref 26.0–34.0)
MCHC: 31.5 g/dL (ref 30.0–36.0)
MCV: 87.2 fL (ref 80.0–100.0)
Monocytes Absolute: 0.4 10*3/uL (ref 0.1–1.0)
Monocytes Relative: 14 %
Neutro Abs: 1.6 10*3/uL — ABNORMAL LOW (ref 1.7–7.7)
Neutrophils Relative %: 58 %
Platelets: 115 10*3/uL — ABNORMAL LOW (ref 150–400)
RBC: 4.62 MIL/uL (ref 4.22–5.81)
RDW: 14.1 % (ref 11.5–15.5)
WBC: 2.8 10*3/uL — ABNORMAL LOW (ref 4.0–10.5)
nRBC: 0 % (ref 0.0–0.2)

## 2023-03-17 LAB — ACETAMINOPHEN LEVEL: Acetaminophen (Tylenol), Serum: <10 ug/mL — ABNORMAL LOW (ref 10–30)

## 2023-03-17 LAB — SALICYLATE LEVEL: Salicylate Lvl: <7 mg/dL — ABNORMAL LOW (ref 7.0–30.0)

## 2023-03-17 MED ORDER — DIPHENHYDRAMINE HCL 25 MG PO CAPS: 50.0000 mg | ORAL_CAPSULE | Freq: Four times a day (QID) | ORAL | Status: DC | PRN

## 2023-03-17 MED ORDER — LORAZEPAM 2 MG/ML IJ SOLN: 2.0000 mg | Freq: Four times a day (QID) | INTRAMUSCULAR | Status: DC | PRN

## 2023-03-17 MED ORDER — STERILE WATER FOR INJECTION IJ SOLN
INTRAMUSCULAR | Status: AC
  Filled 2023-03-17: qty 10

## 2023-03-17 MED ORDER — HALOPERIDOL LACTATE 5 MG/ML IJ SOLN: 5.0000 mg | Freq: Four times a day (QID) | INTRAMUSCULAR | Status: DC | PRN

## 2023-03-17 MED ORDER — ZIPRASIDONE MESYLATE 20 MG IM SOLR
20.0000 mg | Freq: Once | INTRAMUSCULAR | Status: AC
  Administered 2023-03-17: 20 mg via INTRAMUSCULAR
  Filled 2023-03-17: qty 20

## 2023-03-17 MED ORDER — DIPHENHYDRAMINE HCL 50 MG/ML IJ SOLN: 50.0000 mg | Freq: Four times a day (QID) | INTRAMUSCULAR | Status: DC | PRN

## 2023-03-17 MED ORDER — VALBENAZINE TOSYLATE 40 MG PO CAPS
40.0000 mg | ORAL_CAPSULE | Freq: Every day | ORAL | Status: DC
  Administered 2023-03-18: 40 mg via ORAL
  Filled 2023-03-17: qty 1

## 2023-03-17 MED ORDER — LORAZEPAM 1 MG PO TABS: 2.0000 mg | ORAL_TABLET | Freq: Four times a day (QID) | ORAL | Status: DC | PRN

## 2023-03-17 MED ORDER — TETANUS-DIPHTH-ACELL PERTUSSIS 5-2.5-18.5 LF-MCG/0.5 IM SUSY: 0.5000 mL | PREFILLED_SYRINGE | Freq: Once | INTRAMUSCULAR | Status: DC

## 2023-03-17 MED ORDER — HALOPERIDOL 5 MG PO TABS: 5.0000 mg | ORAL_TABLET | Freq: Four times a day (QID) | ORAL | Status: DC | PRN

## 2023-03-17 MED ORDER — SODIUM CHLORIDE 0.9 % IV BOLUS
1000.0000 mL | Freq: Once | INTRAVENOUS | Status: AC
  Administered 2023-03-17: 1000 mL via INTRAVENOUS

## 2023-03-17 NOTE — Progress Notes (Signed)
LCSW Progress Note  [id number]   Jerry Carter  03/17/2023  9:41 PM    Inpatient Behavioral Health Placement  Pt meets inpatient criteria per Arsenio Loader, NP. There are no available beds within [facility] per [facility] Danika Riley,RN. Referral was sent to the following facilities;   Destination  Service Provider Address Phone [facility]  [address] [place], [place] [address] [phone]  [facility]  [address], [place] [address] [phone]  [facility]  [address], [place] [address] [phone]  [facility]  [address] [place] [address] [phone]  [facility]  [address] [place] [address] [phone]  [facility]  [address] [place], [place] [address] [phone]  [facility]  [address] [place] [address] [phone]  [facility]  [address] [place] [address] [phone]  [facility]  [address], [place] [address] [phone]  [facility]  [address] [place] [address] [phone]  [facility]  [address] [place] [address] [phone]  [facility]  [address] [place] [address] [phone]  [facility]  [address], [place] [address] [phone]  [facility]  [place] [phone]  [facility]  [address] [place] [address] [phone]  [facility] EFAX  [address] [place] [phone]  [facility]   [address] [place] [address] [phone]  [facility]  [address], [place] [address] [phone]  [facility]  [address], [place] [address] [phone]  [facility]  [address], [place] [address] [phone]  [facility]  [address] [place] [address] [phone]  [facility]  [address] [place] [address] [phone]  [facility]  [address] [place] [address] [phone]  [facility]  [address], [place] [address] [phone]  [facility]  [address] [place] [address] [phone]  [facility] [address]  [place] [address] [phone]  [facility]  [address] [place] [address] [phone]  [facility]  [address] [place], [place] [address] [phone]     Situation ongoing,  CSW will follow up.    Maryjean Ka, MSW, [facility] 03/17/2023 9:41 PM

## 2023-03-17 NOTE — ED Notes (Signed)
IVC paperwork completed. Original in red folder, 1 copy in medical records drawer, and 3 copies attached to clipboard in purple zone.  Case number:[id number] Exp:03-24-23

## 2023-03-17 NOTE — ED Provider Notes (Signed)
[place] EMERGENCY DEPARTMENT AT [facility] Provider Note   CSN: [id number] Arrival date & time: 03/17/23  1059     History  Chief Complaint  Patient presents with   Manic Behavior   Dizziness    Jerry Carter is a 62 y.o. male with a past medical history significant for schizoaffective disorder, hypertension, hyperlipidemia, and previous TIA who presents to the ED for a psychiatric evaluation.  Patient's friend is at bedside.  Patient's friend concerned about a manic episode.  Patient states he woke up this morning and felt woozy which he describes as feeling nauseous which he attributes to not eating prior to taking his medications.  Patient has tangential speech during initial evaluation.  Denies SI and HI.  Denies auditory/visual hallucinations.  Patient has been compliant with his schizophrenia medications.  Patient denies any dizziness, speech changes, or unilateral weakness.  History obtained from patient and past medical records. No interpreter used during encounter.       Home Medications Prior to Admission medications   Medication Sig Start Date End Date Taking? Authorizing Provider  acetaminophen (TYLENOL) 325 MG tablet Take 2 tablets (650 mg total) by mouth every 4 (four) hours as needed for mild pain (or temp > 37.5 C (99.5 F)). Patient not taking: Reported on 01/29/2023 12/02/22   Marguerita Merles Latif, DO  ARIPiprazole ER (ABILIFY MAINTENA) 400 MG PRSY prefilled syringe Inject 400 mg into the muscle every 30 (thirty) days. 11/26/22   Shanna Cisco, NP  ARIPiprazole ER (ABILIFY MAINTENA) 400 MG PRSY prefilled syringe Inject 400 mg into the muscle every 30 (thirty) days. Next dose 01/31/23 01/04/23     aspirin EC (ASPIRIN 81) 81 MG tablet Take 1 tablet (81 mg total) by mouth daily. 03/05/23   Fleet Contras, MD  aspirin EC 81 MG tablet Take 1 tablet (81 mg total) by mouth daily. Swallow whole. 12/03/22   Marguerita Merles Latif, DO  atorvastatin (LIPITOR) 40 MG tablet  Take 1 tablet (40 mg total) by mouth daily. 03/05/23   Fleet Contras, MD  atorvastatin (LIPITOR) 40 MG tablet Take 1 tablet (40 mg total) by mouth every evening. 03/05/23     divalproex (DEPAKOTE ER) 500 MG 24 hr tablet Take 1 tablet (500 mg total) by mouth 2 (two) times daily. 03/05/23   Fleet Contras, MD  divalproex (DEPAKOTE) 500 MG DR tablet Take 500 mg by mouth 2 (two) times daily. 01/07/23   [provider]  divalproex (DEPAKOTE) 500 MG DR tablet Take 1 tablet (500 mg total) by mouth every evening. 03/05/23     hydrochlorothiazide (HYDRODIURIL) 25 MG tablet Take 0.5 tablets (12.5 mg total) by mouth daily. 01/04/23     losartan (COZAAR) 100 MG tablet Take 1 tablet (100 mg total) by mouth daily. 03/05/23   Fleet Contras, MD  losartan (COZAAR) 25 MG tablet Take 1 tablet (25 mg total) by mouth daily. 03/05/23   Fleet Contras, MD  metoprolol succinate (TOPROL-XL) 50 MG 24 hr tablet Take 1 tablet by mouth daily. 01/07/23   [provider]  metoprolol succinate (TOPROL-XL) 50 MG 24 hr tablet Take 1 tablet (50 mg total) by mouth daily. 03/05/23     senna-docusate (SENOKOT-S) 8.6-50 MG tablet Take 1 tablet by mouth at bedtime as needed for moderate constipation or mild constipation. Patient not taking: Reported on 12/21/2022 12/02/22   Marguerita Merles Latif, DO  senna-docusate (SENOKOT-S) 8.6-50 MG tablet Take 1 tablet by mouth in the evening as needed for constipation.  01/04/23     traZODone (DESYREL) 100 MG tablet Take 100 mg by mouth at bedtime. 01/07/23   [provider]  traZODone (DESYREL) 100 MG tablet Take 1 tablet (100 mg total) by mouth at bedtime as needed for sleep 03/05/23     valbenazine (INGREZZA) 40 MG capsule Take 1 capsule (40 mg total) by mouth daily. 01/28/23 03/29/23  Lamar Sprinkles, MD  candesartan (ATACAND) 4 MG tablet Take 1 tablet (4 mg total) by mouth daily. 12/09/17 04/16/19  Shon Hale, MD      Allergies    Patient has no known allergies.    Review  of Systems   Review of Systems  Gastrointestinal:  Positive for nausea.  Psychiatric/Behavioral:  The patient is hyperactive.     Physical Exam Updated Vital Signs BP (!) 185/112   Pulse 77   Temp 98 F (36.7 C) (Oral)   Resp 18   SpO2 100%  Physical Exam Vitals and nursing note reviewed.  Constitutional:      General: He is not in acute distress.    Appearance: He is not ill-appearing.  HENT:     Head: Normocephalic.  Eyes:     Pupils: Pupils are equal, round, and reactive to light.  Cardiovascular:     Rate and Rhythm: Normal rate and regular rhythm.     Pulses: Normal pulses.     Heart sounds: Normal heart sounds. No murmur heard.    No friction rub. No gallop.  Pulmonary:     Effort: Pulmonary effort is normal.     Breath sounds: Normal breath sounds.  Abdominal:     General: Abdomen is flat. There is no distension.     Palpations: Abdomen is soft.     Tenderness: There is no abdominal tenderness. There is no guarding or rebound.  Musculoskeletal:        General: Normal range of motion.     Cervical back: Neck supple.  Skin:    General: Skin is warm and dry.  Neurological:     General: No focal deficit present.     Mental Status: He is alert.  Psychiatric:        Mood and Affect: Mood normal.        Behavior: Behavior normal.     ED Results / Procedures / Treatments   Labs (all labs ordered are listed, but only abnormal results are displayed) Labs Reviewed  COMPREHENSIVE METABOLIC PANEL - Abnormal; Notable for the following components:      Result Value   BUN 26 (*)    Creatinine, Ser 1.43 (*)    Calcium 8.4 (*)    Total Protein 6.4 (*)    Albumin 3.3 (*)    AST 58 (*)    ALT 51 (*)    GFR, Estimated 55 (*)    All other components within normal limits  CBC WITH DIFFERENTIAL/PLATELET - Abnormal; Notable for the following components:   WBC 2.8 (*)    Hemoglobin 12.7 (*)    Platelets 115 (*)    Neutro Abs 1.6 (*)    All other components within  normal limits  ACETAMINOPHEN LEVEL - Abnormal; Notable for the following components:   Acetaminophen (Tylenol), Serum <10 (*)    All other components within normal limits  SALICYLATE LEVEL - Abnormal; Notable for the following components:   Salicylate Lvl <7.0 (*)    All other components within normal limits  ETHANOL    EKG None  Radiology No results  found.  Procedures Procedures    Medications Ordered in ED Medications  sodium chloride 0.9 % bolus 1,000 mL (1,000 mLs Intravenous New Bag/Given 03/17/23 1339)    ED Course/ Medical Decision Making/ A&P                                 Medical Decision Making Amount and/or Complexity of Data Reviewed Independent Historian: friend External Data Reviewed: notes. Labs: ordered. Decision-making details documented in ED Course. ECG/medicine tests: ordered and independent interpretation performed. Decision-making details documented in ED Course.   This patient presents to the ED for concern of manic behavior, this involves an extensive number of treatment options, and is a complaint that carries with it a high risk of complications and morbidity.  The differential diagnosis includes schizoaffective disorder, metabolic derangement, infection, etc  62 year old male with history of schizoaffective disorder presents to the ED with family friend due to concerns about a manic episode.  Patient has tangential speech during initial evaluation.  He notes he did have an episode of feeling "woozy" earlier today after taking his medication which he describes as feeling nauseous.  Denies dizziness, speech changes, unilateral weakness.  Patient does have a history of previous TIA on 11/2022.  States he feels back to his baseline after eating.  Upon arrival patient afebrile, not tachycardic or hypoxic.  Patient's BP 185/112.  Patient in no acute distress.  Reassuring physical exam.  Normal neurological exam without any neurological deficits.  Patient  able to ambulate without difficulty.  Low suspicion for CVA/TIA.  Medical clearance labs ordered. Patient denies SI/HI and auditory/visual hallucinations.  Acetaminophen, ethanol, salicylate levels within normal limits.  CMP significant for elevated creatinine 1.43 and BUN at 26.  IV fluids given.  Transaminitis with AST and ALT slightly elevated.  Appears they have been elevated in the past.  No right upper quadrant or epigastric tenderness.  Low suspicion for acute cholecystitis.  Normal total bilirubin.  CBC significant for pancytopenia which appears to be chronic.  EKG demonstrates normal sinus rhythm.  No signs of acute ischemia.  Patient has been medically cleared for TTS evaluation.  Patient is here voluntarily.  The patient has been placed in psychiatric observation due to the need to provide a safe environment for the patient while obtaining psychiatric consultation and evaluation, as well as ongoing medical and medication management to treat the patient's condition.  The patient has been placed under full IVC at this time.   Lives at home Has PCP       Final Clinical Impression(s) / ED Diagnoses Final diagnoses:  Manic behavior [facility])    Rx / DC Orders ED Discharge Orders     None         Mannie Stabile, PA-C 03/17/23 1423    Melene Plan, DO 03/17/23 1439
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
## 2023-03-17 NOTE — Progress Notes (Addendum)
Pt was accepted to [facility] 03/18/2023; Bed Assignment Main Campus PENDING Negative COVID faxed to [facility] Fax Number: [phone] (Adult)/ [phone].   Pt meets inpatient criteria per Shearon Stalls     Attending Physician will be Dr. Dianna Limbo Madaram,MD  Report can be called to:[phone]-Pager number, please leave a returned phone number to receive a phone call back.   Pt can arrive after 9:00am   Care Team notified: Danise Mina Hendra,LCSW    Maryjean Ka, MSW, [facility] 03/17/2023 10:23 PM

## 2023-03-17 NOTE — ED Triage Notes (Signed)
Pt arrives to ED with friend. Pt has hx of bipolar and schizophrenia. Pt very hyperactive and manic in triage. Pt stating he believes he is a prophet. Pt denies SI / HI. Pt states he has been medication complaint but states his medication makes him feel "woozy."

## 2023-03-18 LAB — RESP PANEL BY RT-PCR (RSV, FLU A&B, COVID)  RVPGX2
Influenza A by PCR: NEGATIVE
Influenza B by PCR: NEGATIVE
Resp Syncytial Virus by PCR: NEGATIVE
SARS Coronavirus 2 by RT PCR: NEGATIVE

## 2023-03-18 MED ORDER — ASPIRIN 81 MG PO TBEC
81.0000 mg | DELAYED_RELEASE_TABLET | Freq: Every day | ORAL | Status: DC
  Administered 2023-03-18: 81 mg via ORAL
  Filled 2023-03-18: qty 1

## 2023-03-18 MED ORDER — LOSARTAN POTASSIUM 50 MG PO TABS
100.0000 mg | ORAL_TABLET | Freq: Every day | ORAL | Status: DC
  Administered 2023-03-18: 100 mg via ORAL
  Filled 2023-03-18: qty 2

## 2023-03-18 MED ORDER — METOPROLOL SUCCINATE ER 25 MG PO TB24
50.0000 mg | ORAL_TABLET | Freq: Every day | ORAL | Status: DC
  Administered 2023-03-18: 50 mg via ORAL
  Filled 2023-03-18: qty 2

## 2023-03-18 MED ORDER — TRAZODONE HCL 100 MG PO TABS: 100.0000 mg | ORAL_TABLET | Freq: Every day | ORAL | Status: DC

## 2023-03-18 MED ORDER — ATORVASTATIN CALCIUM 40 MG PO TABS
40.0000 mg | ORAL_TABLET | Freq: Every day | ORAL | Status: DC
  Administered 2023-03-18: 40 mg via ORAL
  Filled 2023-03-18: qty 1

## 2023-03-18 MED ORDER — HYDROCHLOROTHIAZIDE 12.5 MG PO TABS
12.5000 mg | ORAL_TABLET | Freq: Every day | ORAL | Status: DC
  Administered 2023-03-18: 12.5 mg via ORAL
  Filled 2023-03-18: qty 1

## 2023-03-18 NOTE — ED Provider Notes (Signed)
Emergency Medicine Observation Re-evaluation Note  Jerry Carter is a 62 y.o. male, seen on rounds today.  Pt initially presented to the ED for complaints of Manic Behavior and Dizziness Currently, the patient is awake pacing around the room.  Physical Exam  BP (!) 201/109 (BP Location: Right Arm)   Pulse (!) 50   Temp 98.7 F (37.1 C)   Resp 20   SpO2 100%  Physical Exam General: Awake and alert, no acute distress Cardiac: Regular rate Lungs: No increased work of breathing Psych: Calm, cooperative, though responding to internal stimuli  ED Course / MDM  EKG:EKG Interpretation Date/Time:  Wednesday March 17 2023 11:45:13 EDT Ventricular Rate:  79 PR Interval:  191 QRS Duration:  91 QT Interval:  378 QTC Calculation: 375 R Axis:   -39  Text Interpretation: Sinus rhythm Atrial premature complexes Left axis deviation Abnormal R-wave progression, early transition Probable anteroseptal infarct, old No significant change since last tracing Confirmed by Elayne Snare ([id number]) on 03/18/2023 8:21:37 AM  I have reviewed the labs performed to date as well as medications administered while in observation.  Recent changes in the last 24 hours include medically cleared.  He was evaluated by psychiatry and recommended for inpatient psych.  Was hypertensive this morning.  Blood pressure medicines were ordered this morning and he will be reassessed.  Plan  Current plan is for inpatient psych, likely [facility] today.    Rexford Maus, DO 03/18/23 [phone]

## 2023-03-18 NOTE — ED Notes (Signed)
Pt ambulatory w/ steady gait, VSS, A&O, calm and cooperative at time of departure. All belongings and paperwork given to [facility] officer.

## 2023-03-18 NOTE — ED Notes (Signed)
Called [facility] secure pager line to request callback for report. Negative covid swab results have been faxed.

## 2023-03-18 NOTE — ED Notes (Signed)
Sitter notified this RN of pt BP. Will recheck BP in an hr. Pt is asymptomatic, hasn't slept all night and is manic.

## 2023-04-09 ENCOUNTER — Other Ambulatory Visit (HOSPITAL_BASED_OUTPATIENT_CLINIC_OR_DEPARTMENT_OTHER): Payer: Self-pay

## 2023-04-10 ENCOUNTER — Other Ambulatory Visit (HOSPITAL_BASED_OUTPATIENT_CLINIC_OR_DEPARTMENT_OTHER): Payer: Self-pay

## 2023-04-10 MED ORDER — METOPROLOL SUCCINATE ER 50 MG PO TB24
ORAL_TABLET | ORAL | 1 refills | Status: DC
  Filled 2023-04-10: qty 30, 30d supply, fill #0

## 2023-04-10 MED ORDER — ATORVASTATIN CALCIUM 40 MG PO TABS: ORAL_TABLET | ORAL | 0 refills | Status: DC

## 2023-04-10 MED ORDER — CLOPIDOGREL BISULFATE 75 MG PO TABS
ORAL_TABLET | ORAL | 0 refills | Status: DC
  Filled 2023-04-20: qty 30, fill #0

## 2023-04-10 MED ORDER — ASPIRIN 81 MG PO TBEC
DELAYED_RELEASE_TABLET | ORAL | 1 refills | Status: DC
  Filled 2023-04-10: qty 15, 15d supply, fill #0

## 2023-04-10 MED ORDER — ATORVASTATIN CALCIUM 40 MG PO TABS
ORAL_TABLET | ORAL | 1 refills | Status: DC
  Filled 2023-04-10: qty 30, 30d supply, fill #0

## 2023-04-10 MED ORDER — LOSARTAN POTASSIUM 100 MG PO TABS
ORAL_TABLET | ORAL | 1 refills | Status: DC
  Filled 2023-04-10: qty 30, 30d supply, fill #0
  Filled 2023-04-20: qty 30, 30d supply, fill #1

## 2023-04-10 MED ORDER — HYDROCHLOROTHIAZIDE 12.5 MG PO CAPS
ORAL_CAPSULE | ORAL | 1 refills | Status: DC
  Filled 2023-04-10: qty 30, 30d supply, fill #0
  Filled 2023-04-20: qty 30, 30d supply, fill #1

## 2023-04-10 MED ORDER — HALOPERIDOL DECANOATE 100 MG/ML IM SOLN
INTRAMUSCULAR | 1 refills | Status: DC
  Filled 2023-04-10 – 2023-04-12 (×2): qty 1, 28d supply, fill #0

## 2023-04-10 MED ORDER — BENZTROPINE MESYLATE 0.5 MG PO TABS
ORAL_TABLET | ORAL | 1 refills | Status: DC
  Filled 2023-04-10: qty 45, 15d supply, fill #0
  Filled 2023-04-20: qty 45, 15d supply, fill #1

## 2023-04-12 ENCOUNTER — Other Ambulatory Visit (HOSPITAL_BASED_OUTPATIENT_CLINIC_OR_DEPARTMENT_OTHER): Payer: Self-pay

## 2023-04-12 ENCOUNTER — Other Ambulatory Visit: Payer: Self-pay

## 2023-04-12 ENCOUNTER — Other Ambulatory Visit (HOSPITAL_COMMUNITY): Payer: Self-pay

## 2023-04-12 MED ORDER — FAMOTIDINE 20 MG PO TABS
20.0000 mg | ORAL_TABLET | Freq: Every morning | ORAL | 2 refills | Status: DC
Start: 2023-04-12 — End: 2023-07-29
  Filled 2023-04-12: qty 30, 30d supply, fill #0
  Filled 2023-05-28: qty 30, 30d supply, fill #1
  Filled 2023-07-02 – 2023-07-05 (×2): qty 30, 30d supply, fill #2

## 2023-04-13 ENCOUNTER — Other Ambulatory Visit (HOSPITAL_BASED_OUTPATIENT_CLINIC_OR_DEPARTMENT_OTHER): Payer: Self-pay

## 2023-04-13 DIAGNOSIS — Z0289 Encounter for other administrative examinations: Secondary | ICD-10-CM

## 2023-04-15 ENCOUNTER — Encounter (HOSPITAL_COMMUNITY): Payer: MEDICAID | Admitting: Student

## 2023-04-15 ENCOUNTER — Ambulatory Visit (INDEPENDENT_AMBULATORY_CARE_PROVIDER_SITE_OTHER): Payer: MEDICAID | Admitting: Student

## 2023-04-15 VITALS — BP 113/66 | HR 81 | Wt 157.2 lb

## 2023-04-15 DIAGNOSIS — T43505A Adverse effect of unspecified antipsychotics and neuroleptics, initial encounter: Secondary | ICD-10-CM

## 2023-04-15 DIAGNOSIS — F250 Schizoaffective disorder, bipolar type: Secondary | ICD-10-CM

## 2023-04-15 DIAGNOSIS — G2401 Drug induced subacute dyskinesia: Secondary | ICD-10-CM | POA: Diagnosis not present

## 2023-04-15 NOTE — Progress Notes (Cosign Needed)
BH MD/PA/NP OP Progress Note  04/15/2023 10:54 AM Jerry Carter  MRN:  [id number]  Chief Complaint:  Chief Complaint  Patient presents with   Follow-up   Medication Refill   Schizophrenia   HPI: 62 year old male seen today for follow up psychiatric evaluation. He has a psychiatric history of schizoaffective disorder and Neuroleptic-induced tardive dyskinesia . He was managed on Abilify Maintena 400 mg every 28 days, which was switched to Haldol Decanoate 100 mg q 28 prior to being seen by this Clinical research associate. He notes his medication is effective in managing his psychiatric conditions.   He reports sleeping well, and his appetite is intact. He denies AVH, SI, HI, paranoia. He has baseline hyperreligiosity. 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 He denies problems with transportation and has an interview with [employer] this weekend, to which he looks forward.  He was hospitalized at [facility] after a manic episode 9/9, then brought to [facility] on 9/11 after refusing to eat due to fasting and having a subsequent hypoglycemic episode. During hospitalization, he was switched from Abilify to Haldol, ultimately receiving Haldol Decanoate injection on 9/19. In discussing this medication, he reports similar efficacy to the Prolixin, but he prefers monthly dosing over biweekly.  He had some nausea around the time of medical hospitalization but denies further. He denies dizziness, akathisia.   He says his goal is to eat healthier with fasting and exercise. He wants to try to mitigate having to take so many medications, particularly for his blood pressure.  When he first presented to the ED, psychiatric NP noted no involuntary movements. Ingrezza was held at the time, and Cogentin was started with the initiation of Haldol Dec. Today, some perioral movements and mild head bobbing noted. He is open to restarting Ingrezza.  Patient denies tobacco product, alcohol, and illicit drug  use.    Visit Diagnosis:    ICD-10-CM   1. Schizoaffective disorder, bipolar type (HCC)  F25.0     2. Neuroleptic-induced tardive dyskinesia  G24.01    T43.505A         Past Psychiatric History: schizoaffective disorder and Neuroleptic-induced tardive dyskinesia   Past Medical History:  Past Medical History:  Diagnosis Date   Bipolar disorder (HCC)    Hypertension    Schizophrenia (HCC)    No past surgical history on file.  Family Psychiatric History: None reported  Family History: No family history on file.  Social History:  Social History   Socioeconomic History   Marital status: Single    Spouse name: Not on file   Number of children: Not on file   Years of education: Not on file   Highest education level: Not on file  Occupational History   Not on file  Tobacco Use   Smoking status: Former    Types: Cigarettes   Smokeless tobacco: Never   Tobacco comments:    Quit smoking over 40 years ago -02/06/2022  Vaping Use   Vaping status: Never Used  Substance and Sexual Activity   Alcohol use: No    Alcohol/week: 0.0 standard drinks of alcohol   Drug use: No   Sexual activity: Not Currently  Other Topics Concern   Not on file  Social History Narrative   Not on file   Social Determinants of Health   Financial Resource Strain: Not on file  Food Insecurity: Low Risk  (03/31/2023)   Received from [facility]   Food Insecurity  Within the past 12 months, did the food you bought just not last and you didn't have money to get more?: No    Within the past 12 months, did you worry that your food would run out before you got money to buy more?: No  Transportation Needs: Low Risk  (03/31/2023)   Received from [facility]   Transportation Needs    Within the past 12 months, has a lack of transportation kept you from medical appointments or from doing things needed for daily living?: No  Physical Activity: Not on file  Stress: Not on  file  Social Connections: Not on file    Allergies: No Known Allergies  Metabolic Disorder Labs: Lab Results  Component Value Date   HGBA1C 6.0 (H) 12/21/2022   MPG 126 12/21/2022   MPG 119.76 12/02/2022   Lab Results  Component Value Date   PROLACTIN 20.9 (H) 04/30/2022   Lab Results  Component Value Date   CHOL 96 02/19/2023   TRIG 30 02/19/2023   HDL 40 02/19/2023   CHOLHDL 2.4 02/19/2023   VLDL 6 12/21/2022   LDLCALC 47 02/19/2023   LDLCALC 66 12/21/2022   Lab Results  Component Value Date   TSH 1.69 02/19/2023   TSH 1.893 12/21/2022    Therapeutic Level Labs: Lab Results  Component Value Date   LITHIUM 0.53 (L) 03/03/2014   Lab Results  Component Value Date   VALPROATE 113.4 (H) 02/22/2014   No results found for: "CBMZ"  Current Medications: Current Outpatient Medications  Medication Sig Dispense Refill   aspirin EC (ASPIRIN 81) 81 MG tablet Take 1 tablet (81 mg total) by mouth daily. 30 tablet 5   atorvastatin (LIPITOR) 40 MG tablet Take 1 tablet (40 mg total) by mouth daily. 30 tablet 6   benztropine (COGENTIN) 0.5 MG tablet Take one tablet by mouth 3 times daily as needed for EPS prevention 45 tablet 1   clopidogrel (PLAVIX) 75 MG tablet Take one tablet by mouth once daily 30 tablet 0   divalproex (DEPAKOTE ER) 500 MG 24 hr tablet Take 1 tablet (500 mg total) by mouth 2 (two) times daily. 60 tablet 6   famotidine (PEPCID) 20 MG tablet Take 1 tablet (20 mg total) by mouth in the morning. 30 tablet 2   hydrochlorothiazide (MICROZIDE) 12.5 MG capsule Take one capsule by mouth once daily for high blood pressure 30 capsule 1   losartan (COZAAR) 100 MG tablet Take 1 tablet (100 mg total) by mouth daily. 30 tablet 6   metoprolol succinate (TOPROL-XL) 50 MG 24 hr tablet Take 1 tablet (50 mg total) by mouth daily. 30 tablet 5   traZODone (DESYREL) 100 MG tablet Take 1 tablet (100 mg total) by mouth at bedtime as needed for sleep 30 tablet 5   valbenazine  (INGREZZA) 40 MG capsule Take 1 capsule (40 mg total) by mouth daily. 30 capsule 1   divalproex (DEPAKOTE) 500 MG DR tablet Take 500 mg by mouth 2 (two) times daily.     [START ON 05/06/2023] haloperidol decanoate (HALDOL DECANOATE) 100 MG/ML injection Inject 1 mL (100 mg total) into the muscle every 28 (twenty-eight) days. 1 mL 11   losartan (COZAAR) 100 MG tablet Take one tablet by mouth once daily for high blood pressure 30 tablet 1   No current facility-administered medications for this visit.     Musculoskeletal: Strength & Muscle Tone: within normal limits Gait & Station: normal Patient leans: N/A  Psychiatric Specialty  Exam: Review of Systems  Constitutional:  Negative for activity change and unexpected weight change.       Denies akathisia  Cardiovascular:  Negative for chest pain.  Gastrointestinal: Negative.   Neurological:  Negative for dizziness, seizures and headaches.    Blood pressure 113/66, pulse 81, weight 157 lb 3.2 oz (71.3 kg), SpO2 99%.Body mass index is 22.56 kg/m.  General Appearance: Casual  Eye Contact:  Good  Speech:  Clear and Coherent and Normal Rate  Volume:  Normal  Mood:  Euthymic  Affect:  Congruent  Thought Process:  Coherent, Goal Directed, and Linear  Orientation:  Full (Time, Place, and Person)  Thought Content: WDL, Logical, and mostly logical    Suicidal Thoughts:  No  Homicidal Thoughts:  No  Memory:  Immediate;   Good Recent;   Good Remote;   Good  Judgement:  Good  Insight:  Fair  Psychomotor Activity:  Normal  Concentration:  Concentration: Good and Attention Span: Good  Recall:  Good  Fund of Knowledge: Good  Language: Good  Akathisia:  No  Handed:  Right  AIMS (if indicated): 2  Assets:  Communication Skills Desire for Improvement Financial Resources/Insurance Housing Physical Health Social Support  ADL's:  Intact  Cognition: WNL  Sleep:  Good   Screenings: AIMS    Flowsheet Row ED [facility] 03/17/[facility] in [facility]  Emergency Department at [facility] Office Visit from 07/23/2022 in [facility] Office Visit from 02/06/2022 in [facility]  AIMS Total Score 0 0 6      AUDIT    Flowsheet Row [facility]  Alcohol Use Disorder Identification Test Final Score (AUDIT) 0      GAD-7    Flowsheet Row Office Visit from 04/30/2022 in [facility]  Total GAD-7 Score 0      PHQ2-9    Flowsheet Row Office Visit from 01/28/2023 in [facility] Office Visit from 04/30/2022 in [facility] Office Visit from 12/09/2017 in [facility] Office Visit from 04[facility]16/2017 in [facility] Office Visit from 03/04/2017 in [address] [facility]  PHQ-2 Total Score 0 0 0 0 0      Flowsheet Row ED from 03/17/2023 in [facility] Emergency Department at [facility] ED from 12/21/2022 in [facility] ED to Hosp-Admission (Discharged) from 12/01/2022 in [facility]  C-SSRS RISK CATEGORY No Risk No Risk No Risk        Assessment and Plan: Patient reports that his anxiety, depression, and psychosis are well managed and denies presence today. He appears calm and euthymic, able to rationalize and differentiate good from bad decisions. He notes that his TD has been affecting him after previous improvements with Ingrezza. Noted signs of TD today: mild involuntary perioral movements and head bobbing as he stood and leaned forward. These movements did not dissipate with distraction.  Patient agreeable to restarting Ingrezza 40 mg for his TD symptoms, and continuing Haldol Dec LAI, without further medication changes today.   Will consider consolidating Depakote to once daily dosing during next visit. Plan to repeat  Depakote level.  1. Schizoaffective disorder, bipolar type (HCC)  - Continue Haldol Decanoate 100 mg/ml q28 days (last IM received 9/19) - START Ingrezza 40 mg daily due to neuroleptic-induced TD -Continue Depakote 500 mg  BID (RX last written by PCP)  Level therapeutic at 62 on 9/10. Will want to keep an eye on this and maintain within the lower end of the range as patient ages. Not a true trough, however, so will need to repeat. - Continue Trazodone 100 mg at bedtime PRN insomnia (RX last written by PCP).  Collaboration of Care: Collaboration of Care: Other provider involved in patient's care AEB PCP and shot clinic staff  Patient/Guardian was advised Release of Information must be obtained prior to any record release in order to collaborate their care with an outside provider. Patient/Guardian was advised if they have not already done so to contact the registration department to sign all necessary forms in order for [place] to release information regarding their care.   Consent: Patient/Guardian gives verbal consent for treatment and assignment of benefits for services provided during this visit. Patient/Guardian expressed understanding and agreed to proceed.   Follow up 10/17 for shot clinic Follow up for medication management 11/14.   Lamar Sprinkles, MD 04/15/2023 10:54 AM

## 2023-04-19 ENCOUNTER — Encounter (HOSPITAL_COMMUNITY): Payer: Self-pay | Admitting: Student

## 2023-04-19 MED ORDER — HALOPERIDOL DECANOATE 100 MG/ML IM SOLN: 100.0000 mg | INTRAMUSCULAR | 11 refills | Status: DC

## 2023-04-19 MED ORDER — VALBENAZINE TOSYLATE 40 MG PO CAPS: 40.0000 mg | ORAL_CAPSULE | Freq: Every day | ORAL | 1 refills | Status: DC

## 2023-04-20 ENCOUNTER — Other Ambulatory Visit (HOSPITAL_BASED_OUTPATIENT_CLINIC_OR_DEPARTMENT_OTHER): Payer: Self-pay

## 2023-04-20 ENCOUNTER — Other Ambulatory Visit: Payer: Self-pay

## 2023-04-20 MED ORDER — LOSARTAN POTASSIUM 100 MG PO TABS
100.0000 mg | ORAL_TABLET | Freq: Every day | ORAL | 1 refills | Status: DC
  Filled 2023-04-20: qty 15, 15d supply, fill #0
  Filled 2023-05-28: qty 30, 30d supply, fill #0

## 2023-04-20 MED ORDER — HYDROCHLOROTHIAZIDE 12.5 MG PO CAPS
12.5000 mg | ORAL_CAPSULE | Freq: Every day | ORAL | 1 refills | Status: DC
  Filled 2023-04-20: qty 15, 15d supply, fill #0
  Filled 2023-05-28: qty 15, 15d supply, fill #1

## 2023-04-20 MED ORDER — CLOPIDOGREL BISULFATE 75 MG PO TABS
75.0000 mg | ORAL_TABLET | Freq: Every day | ORAL | 1 refills | Status: AC
  Filled 2023-04-20: qty 30, 30d supply, fill #0

## 2023-04-20 MED ORDER — BENZTROPINE MESYLATE 0.5 MG PO TABS
0.5000 mg | ORAL_TABLET | Freq: Three times a day (TID) | ORAL | 1 refills | Status: DC
Start: 2023-04-10 — End: 2023-06-18
  Filled 2023-04-20: qty 45, 15d supply, fill #0
  Filled 2023-05-28: qty 45, 15d supply, fill #1

## 2023-04-21 ENCOUNTER — Other Ambulatory Visit (HOSPITAL_BASED_OUTPATIENT_CLINIC_OR_DEPARTMENT_OTHER): Payer: Self-pay

## 2023-04-28 ENCOUNTER — Ambulatory Visit (HOSPITAL_COMMUNITY): Payer: MEDICAID

## 2023-05-06 ENCOUNTER — Encounter (HOSPITAL_COMMUNITY): Payer: Self-pay

## 2023-05-06 ENCOUNTER — Ambulatory Visit (INDEPENDENT_AMBULATORY_CARE_PROVIDER_SITE_OTHER): Payer: MEDICAID

## 2023-05-06 VITALS — BP 119/77 | HR 58 | Temp 97.9°F | Ht 69.0 in | Wt 158.0 lb

## 2023-05-06 DIAGNOSIS — F250 Schizoaffective disorder, bipolar type: Secondary | ICD-10-CM | POA: Diagnosis not present

## 2023-05-06 MED ORDER — HALOPERIDOL DECANOATE 100 MG/ML IM SOLN
100.0000 mg | Freq: Once | INTRAMUSCULAR | Status: AC
Start: 2023-05-06 — End: 2023-05-06
  Administered 2023-05-06: 100 mg via INTRAMUSCULAR

## 2023-05-06 NOTE — Progress Notes (Signed)
Patient in today for due Haldol Decanoate 100 mg/ml IM every 28 day injection.  Patient presented with flat affect, level mood and denied any current symptoms, no suicidal or homicidal ideations with no plan, intent or means to want to harm self or others this date.  Patient denied any auditory or visual hallucinations and stated doing okay since recently being in the hospital.  Patient's Haldol Dec 100 mg/ml IM injection prepared as ordered and given to patient in his preferred right deltoid area.  Patient tolerated due injection without complaint of pain or discomfort and agreed to return in 4 weeks for next due injection.  Patient requested to call if any issues or problems with symptoms prior to next appointment. Walked pt out and he scheduled next appointment for 4 weeks.

## 2023-05-17 ENCOUNTER — Other Ambulatory Visit (INDEPENDENT_AMBULATORY_CARE_PROVIDER_SITE_OTHER): Payer: MEDICAID

## 2023-05-17 ENCOUNTER — Encounter: Payer: Self-pay | Admitting: Nurse Practitioner

## 2023-05-17 ENCOUNTER — Ambulatory Visit (INDEPENDENT_AMBULATORY_CARE_PROVIDER_SITE_OTHER): Payer: MEDICAID | Admitting: Nurse Practitioner

## 2023-05-17 VITALS — BP 90/60 | HR 68 | Ht 68.75 in | Wt 159.2 lb

## 2023-05-17 DIAGNOSIS — R112 Nausea with vomiting, unspecified: Secondary | ICD-10-CM | POA: Diagnosis not present

## 2023-05-17 DIAGNOSIS — Z1211 Encounter for screening for malignant neoplasm of colon: Secondary | ICD-10-CM

## 2023-05-17 DIAGNOSIS — R197 Diarrhea, unspecified: Secondary | ICD-10-CM

## 2023-05-17 DIAGNOSIS — I499 Cardiac arrhythmia, unspecified: Secondary | ICD-10-CM | POA: Diagnosis not present

## 2023-05-17 LAB — CBC WITH DIFFERENTIAL/PLATELET
Basophils Absolute: 0 10*3/uL (ref 0.0–0.1)
Basophils Relative: 0.3 % (ref 0.0–3.0)
Eosinophils Absolute: 0 10*3/uL (ref 0.0–0.7)
Eosinophils Relative: 0.7 % (ref 0.0–5.0)
HCT: 42.4 % (ref 39.0–52.0)
Hemoglobin: 13.5 g/dL (ref 13.0–17.0)
Lymphocytes Relative: 21.2 % (ref 12.0–46.0)
Lymphs Abs: 1.2 10*3/uL (ref 0.7–4.0)
MCHC: 31.7 g/dL (ref 30.0–36.0)
MCV: 88.2 fL (ref 78.0–100.0)
Monocytes Absolute: 0.5 10*3/uL (ref 0.1–1.0)
Monocytes Relative: 9.6 % (ref 3.0–12.0)
Neutro Abs: 3.9 10*3/uL (ref 1.4–7.7)
Neutrophils Relative %: 68.2 % (ref 43.0–77.0)
Platelets: 138 10*3/uL — ABNORMAL LOW (ref 150.0–400.0)
RBC: 4.8 Mil/uL (ref 4.22–5.81)
RDW: 15.4 % (ref 11.5–15.5)
WBC: 5.7 10*3/uL (ref 4.0–10.5)

## 2023-05-17 LAB — COMPREHENSIVE METABOLIC PANEL
ALT: 15 U/L (ref 0–53)
AST: 16 U/L (ref 0–37)
Albumin: 3.9 g/dL (ref 3.5–5.2)
Alkaline Phosphatase: 56 U/L (ref 39–117)
BUN: 21 mg/dL (ref 6–23)
CO2: 33 meq/L — ABNORMAL HIGH (ref 19–32)
Calcium: 9.5 mg/dL (ref 8.4–10.5)
Chloride: 107 meq/L (ref 96–112)
Creatinine, Ser: 1.24 mg/dL (ref 0.40–1.50)
GFR: 61.58 mL/min (ref >60.00)
Glucose, Bld: 63 mg/dL — ABNORMAL LOW (ref 70–99)
Potassium: 4.3 meq/L (ref 3.5–5.1)
Sodium: 146 meq/L — ABNORMAL HIGH (ref 135–145)
Total Bilirubin: 0.4 mg/dL (ref 0.2–1.2)
Total Protein: 6.8 g/dL (ref 6.0–8.3)

## 2023-05-17 LAB — MAGNESIUM: Magnesium: 2 mg/dL (ref 1.5–2.5)

## 2023-05-17 LAB — LIPASE: Lipase: 10 U/L — ABNORMAL LOW (ref 11.0–59.0)

## 2023-05-17 NOTE — Progress Notes (Signed)
Agree with assessment and plan as outlined.  

## 2023-05-17 NOTE — Progress Notes (Signed)
05/17/2023 Jerry Carter [id number] 1961-04-24   CHIEF COMPLAINT: Vomiting, diarrhea, schedule a colonoscopy   HISTORY OF PRESENT ILLNESS: Jerry Carter is a 62 year old male with a past medical history of bipolar disorder, schizophrenia, neuroleptic induced tardive dyskinesia, hypertension, hyperlipidemia and TIA on ASA, no longer on Plavix. He presents today to schedule a screening colonoscopy. He also endorses having intermittent nausea and vomiting once or twice weekly for the past month.No coffee ground or frank red hematemesis.  He typically passes a normal brown formed stool every 2 to 3 days but over the past month he sometimes has nonbloody diarrhea when he vomits.  No abdominal pain.  He has heartburn 2 to 3 days weekly for the past month without dysphagia.  He was previously prescribed famotidine but is not taking it.  His father died from colon cancer in his late 41s.  He denies ever having a screening colonoscopy.  He was admitted to the hospital 12/01/2022 with dizziness and slurred speech.  Neurology was consulted and it was thought he likely had a TIA. CTA head and neck showed no acute normality did show diminutive vertebrobasilar system.  Brain RI was negative for acute intracranial abnormality.  He was placed on dual antiplatelet therapy with aspirin 81 mg daily and Clopidogrel 75 mg for 3 weeks then just aspirin alone.  He denies having any chest pain, palpitations or shortness of breath.  He has intermittent dizziness. He is a challenging historian and he does not have a clear understanding of which medications he is taking at home.  He lives with 1 roommate.  Non-smoker, no alcohol or drug use.  He presented to the ED 03/17/2023 with a manic episode and he felt nauseous and dizziness.  Laboratory studies in the ED showed a WBC count of 2.8.  Hemoglobin 12.7.  Platelet 115.  BUN 26.  Creatinine 1.43.  AST 58.  ALT 51.  Total bili 0.6.  Alk phos 73.  Acetaminophen, ethanol and  salicylate levels were normal.  EKG showed normal sinus rhythm with PACs.  He was seen by behavioral health and was transferred to [facility].    Labs 03/31/2023: WBC 4.4.  Hemoglobin 12.9.  Hematocrit 39.  Platelet count 133.     Latest Ref Rng & Units 03/17/2023   11:37 AM 02/19/2023   10:38 AM 12/21/2022    8:43 AM  CBC  WBC 4.0 - 10.5 K/uL 2.8  2.7  3.1   Hemoglobin 13.0 - 17.0 g/dL 40.9  81.1  91.4   Hematocrit 39.0 - 52.0 % 40.3  40.2  39.9   Platelets 150 - 400 K/uL 115  116  196        Latest Ref Rng & Units 03/17/2023   11:37 AM 02/19/2023   10:38 AM 12/21/2022    8:43 AM  CMP  Glucose 70 - 99 mg/dL 99  86  88   BUN 8 - 23 mg/dL 26  18  19    Creatinine 0.61 - 1.24 mg/dL 7.82  9.56  2.13   Sodium 135 - 145 mmol/L 135  139  140   Potassium 3.5 - 5.1 mmol/L 4.0  4.5  3.8   Chloride 98 - 111 mmol/L 101  106  101   CO2 22 - 32 mmol/L 23  25  26    Calcium 8.9 - 10.3 mg/dL 8.4  8.7  9.2   Total Protein 6.5 - 8.1 g/dL 6.4  6.2  7.1  Total Bilirubin 0.3 - 1.2 mg/dL 0.6  0.4  0.7   Alkaline Phos 38 - 126 U/L 73   52   AST 15 - 41 U/L 58  28  87   ALT 0 - 44 U/L 51  31  53      ECHO 12/01/2022: 1. Left ventricular ejection fraction, by estimation, is 60 to 65%. The  left ventricle has normal function. The left ventricle has no regional  wall motion abnormalities. There is mild concentric left ventricular  hypertrophy. Left ventricular diastolic  parameters are indeterminate.   2. Right ventricular systolic function is normal. The right ventricular  size is normal. There is normal pulmonary artery systolic pressure.   3. Left atrial size was mildly dilated.   4. Right atrial size was mildly dilated.   5. The mitral valve is normal in structure. Mild mitral valve  regurgitation. No evidence of mitral stenosis.   6. The aortic valve is tricuspid. Aortic valve regurgitation is mild. No  aortic stenosis is present.   7. The inferior vena cava is normal in size with  greater than 50%  respiratory variability, suggesting right atrial pressure of 3 mmHg.    Past Medical History:  Diagnosis Date   Bipolar disorder (HCC)    HLD (hyperlipidemia)    HTN (hypertension)    Hypertension    Schizophrenia (HCC)    Past Surgical History:  Procedure Laterality Date   NO PAST SURGERIES     Social History: He is single.  He lives with a roommate.  Non-smoker.  No alcohol use.  No drug use.  Family History: Father was diagnosed with colon cancer in his late 61s.  No Known Allergies    Outpatient Encounter Medications as of 05/17/2023  Medication Sig   aspirin EC (ASPIRIN 81) 81 MG tablet Take 1 tablet (81 mg total) by mouth daily.   atorvastatin (LIPITOR) 40 MG tablet Take 1 tablet (40 mg total) by mouth daily.   benztropine (COGENTIN) 0.5 MG tablet Take 1 tablet (0.5 mg total) by mouth 3 (three) times daily as needed for EPS prevention.   clopidogrel (PLAVIX) 75 MG tablet Take 1 tablet (75 mg total) by mouth daily.   divalproex (DEPAKOTE ER) 500 MG 24 hr tablet Take 1 tablet (500 mg total) by mouth 2 (two) times daily.   famotidine (PEPCID) 20 MG tablet Take 1 tablet (20 mg total) by mouth in the morning.   haloperidol decanoate (HALDOL DECANOATE) 100 MG/ML injection Inject 1 mL (100 mg total) into the muscle every 28 (twenty-eight) days.   hydrochlorothiazide (MICROZIDE) 12.5 MG capsule Take 1 capsule (12.5 mg total) by mouth daily for high blood pressure.   losartan (COZAAR) 100 MG tablet Take 1 tablet (100 mg total) by mouth daily for high blood pressure.   metoprolol succinate (TOPROL-XL) 50 MG 24 hr tablet Take 1 tablet (50 mg total) by mouth daily.   traZODone (DESYREL) 100 MG tablet Take 1 tablet (100 mg total) by mouth at bedtime as needed for sleep   valbenazine (INGREZZA) 40 MG capsule Take 1 capsule (40 mg total) by mouth daily.   [DISCONTINUED] candesartan (ATACAND) 4 MG tablet Take 1 tablet (4 mg total) by mouth daily.   [DISCONTINUED]  divalproex (DEPAKOTE) 500 MG DR tablet Take 500 mg by mouth 2 (two) times daily.   [DISCONTINUED] losartan (COZAAR) 100 MG tablet Take 1 tablet (100 mg total) by mouth daily.   No facility-administered encounter medications on file as of  05/17/2023.    REVIEW OF SYSTEMS:  Gen: Denies fever, sweats or chills. No weight loss.  CV: Denies chest pain, palpitations or edema. Resp: Denies cough, shortness of breath of hemoptysis.  GI: See HPI.  GU: Denies urinary burning, blood in urine, increased urinary frequency or incontinence. MS: Denies joint pain, muscles aches or weakness. Derm: Denies rash, itchiness, skin lesions or unhealing ulcers. Psych: See HPI. Heme: Denies bruising, easy bleeding. Neuro: + Dizziness.  Endo:  Denies any problems with DM, thyroid or adrenal function.  PHYSICAL EXAM: BP 90/60 (BP Location: Left Arm, Patient Position: Sitting, Cuff Size: Normal)   Pulse 68   Ht 5' 8.75" (1.746 m) Comment: height measured without shoes  Wt 159 lb 4 oz (72.2 kg)   BMI 23.69 kg/m  General: 62 year old male in no acute distress. Head: Normocephalic and atraumatic. Eyes:  Sclerae non-icteric, conjunctive pink. Ears: Normal auditory acuity. Mouth: Dentition intact. No ulcers or lesions.  Neck: Supple, no lymphadenopathy or thyromegaly.  Lungs: Clear bilaterally to auscultation without wheezes, crackles or rhonchi. Heart: Heart rhythm consistently irregular in a rate controlled. No murmur, rub or gallop appreciated.  Abdomen: Soft, nontender, nondistended. No masses. No hepatosplenomegaly. Normoactive bowel sounds x 4 quadrants.  Rectal: Deferred.  Musculoskeletal: Symmetrical with no gross deformities. Skin: Warm and dry. No rash or lesions on visible extremities. Extremities: No edema. Neurological: Alert oriented x 4, no focal deficits.  Psychological:  Alert and cooperative. Normal mood and affect.  ASSESSMENT AND PLAN:  62 year old male with nausea, vomiting and  intermittent diarrhea which occurs once or twice weekly for the past month.  No abdominal pain. -CBC, CMP, lipase and magnesium level -RUQ sonogram -Check GI pathogen panel and C. difficile if patient demonstrates diarrhea daily -Eventual EGD at time of colonoscopy if symptoms persist  GERD -Famotidine 20 mg daily OTC -GERD diet discussed -See plan above  Elevated LFTs -Labs and RUQ sono as ordered above   Mild normocytic anemia. No overt GI bleeding.   Thrombocytopenia -Labs and RUQ sonogram as ordered above  TIA 11/2022, treated with DAPT with Clopidogrel and aspirin x 3 weeks then only aspirin  Irregular heart rhythm on exam today, rate controlled.  Patient asymptomatic at this time but has reported intermittent episodes of dizziness.   -I contacted the patient's PCPs office and left a detailed message on the physician's line regarding request for EKG.  Patient was also instructed to contact PCP Dr. Concepcion Elk today to obtain a twelve-lead EKG and to follow-up regarding dizziness. Patient was instructed go to the emergency room if he develops dizziness, palpitations, chest pain or shortness of breath. -His EKGs in May and August 2024 showed a normal sinus rhythm, PACs without A-fib.  In the setting of having a TIA 5 months ago, intermittent dizziness and an irregular heart rhythm on exam today,  I am referring the patient to cardiology for further evaluation and cardiac clearance required prior to pursuing any endoscopic evaluation.  Colon cancer screening.  Father diagnosed with colon cancer in his late 61s. -Colonoscopy to be scheduled after cardiac clearance received. Colonoscopy benefits and risks discussed including risk with sedation, risk of bleeding, perforation and infection.  Schizophrenia, bipolar disorder which required inpatient admission August - Sept 2024    CC:  Fleet Contras, MD

## 2023-05-17 NOTE — Patient Instructions (Signed)
You have been scheduled for an abdominal ultrasound at [facility] (1st floor of hospital) on 05/21/23 at 10:00 am. Please arrive 30 minutes prior to your appointment for registration. Make certain not to have anything to eat or drink 6 hours prior to your appointment. Should you need to reschedule your appointment, please contact radiology at [phone]. This test typically takes about 30 minutes to perform.  Your provider has requested that you go to the basement level for lab work before leaving today. Press "B" on the elevator. The lab is located at the first door on the left as you exit the elevator.  Contact your primary care doctor today regarding irregular heart rhythm on exam & request a 12 lead EKG.  Go to the ED if you develop dizziness, chest pain or shortness of breath.  Famotidine 20 mg- take 1 by mouth daily as needed (purchase over the counter)  Due to recent changes in healthcare laws, you may see the results of your imaging and laboratory studies on MyChart before your provider has had a chance to review them.  We understand that in some cases there may be results that are confusing or concerning to you. Not all laboratory results come back in the same time frame and the provider may be waiting for multiple results in order to interpret others.  Please give [place] 48 hours in order for your provider to thoroughly review all the results before contacting the office for clarification of your results.   Thank you for trusting me with your gastrointestinal care!   Alcide Evener, CRNP

## 2023-05-21 ENCOUNTER — Ambulatory Visit (HOSPITAL_COMMUNITY): Payer: MEDICAID | Attending: Nurse Practitioner

## 2023-05-26 ENCOUNTER — Telehealth: Payer: Self-pay

## 2023-05-26 NOTE — Telephone Encounter (Signed)
Error

## 2023-05-28 ENCOUNTER — Other Ambulatory Visit (HOSPITAL_BASED_OUTPATIENT_CLINIC_OR_DEPARTMENT_OTHER): Payer: Self-pay

## 2023-06-03 ENCOUNTER — Ambulatory Visit (INDEPENDENT_AMBULATORY_CARE_PROVIDER_SITE_OTHER): Payer: MEDICAID | Admitting: *Deleted

## 2023-06-03 ENCOUNTER — Ambulatory Visit (INDEPENDENT_AMBULATORY_CARE_PROVIDER_SITE_OTHER): Payer: MEDICAID | Admitting: Student

## 2023-06-03 ENCOUNTER — Telehealth (HOSPITAL_COMMUNITY): Payer: Self-pay | Admitting: *Deleted

## 2023-06-03 ENCOUNTER — Encounter (HOSPITAL_COMMUNITY): Payer: Self-pay

## 2023-06-03 VITALS — BP 160/101 | HR 99 | Resp 16 | Ht 68.0 in | Wt 167.6 lb

## 2023-06-03 DIAGNOSIS — T43505A Adverse effect of unspecified antipsychotics and neuroleptics, initial encounter: Secondary | ICD-10-CM

## 2023-06-03 DIAGNOSIS — G2401 Drug induced subacute dyskinesia: Secondary | ICD-10-CM

## 2023-06-03 DIAGNOSIS — F250 Schizoaffective disorder, bipolar type: Secondary | ICD-10-CM

## 2023-06-03 MED ORDER — HALOPERIDOL DECANOATE 100 MG/ML IM SOLN
100.0000 mg | Freq: Once | INTRAMUSCULAR | Status: AC
  Administered 2023-06-03: 100 mg via INTRAMUSCULAR

## 2023-06-03 NOTE — Patient Instructions (Addendum)
Instructions from medications: 1.  Discontinue trazodone, as it has not been required for sleep 2.  Take Depakote ER 1000 mg (2 tablets) at bedtime instead of 500 mg twice daily 3.  Discontinue Cogentin 0.5 mg, as it is causing side effects. 4.  Increase to Ingrezza 60 mg daily for involuntary facial movements

## 2023-06-03 NOTE — Progress Notes (Addendum)
BH MD/PA/NP OP Progress Note  06/03/2023 1:34 PM  Jerry Carter  MRN:  [id number]  Chief Complaint:  Chief Complaint  Patient presents with   Medication Management   HPI: Jerry Carter is a 62 year old male with a psychiatric history of schizoaffective disorder bipolar type and neuroleptic-induced tardive dyskinesia who is seen today for follow up psychiatric evaluation.  He is managed on Haldol Decanoate 100 mg q 28, which he notes is effective in managing his psychiatric conditions.   Since last visit, patient reports good mood, sleeping well, and his appetite is intact. He denies AVH, SI, HI, paranoia. He has baseline hyperreligiosity, but nothing out of proportion today.  Although he is dressed in [employer], he has improved insight, stating that he understands that his health will ultimately prevent him from working at this time, and he has come to peace with it.  Patient does endorse dry eyes and dry mouth, but denies akathisia, dizziness, nausea, and any other adverse effects.  He reports that he has been taking the Cogentin 3 times daily scheduled, despite lack of EPS symptoms including rigidity, dystonia, or tremors.  He reports the desire to decrease his medication burden and is agreeable to discontinuing medications that are futile as well as consolidating dosages were appropriate.  Patient denies tobacco product, alcohol, and illicit drug use.    Visit Diagnosis:    ICD-10-CM   1. Schizoaffective disorder, bipolar type (HCC)  F25.0     2. Neuroleptic-induced tardive dyskinesia  G24.01    T43.505A          Past Psychiatric History: schizoaffective disorder and Neuroleptic-induced tardive dyskinesia   Past Medical History:  Past Medical History:  Diagnosis Date   Anxiety    Bipolar disorder (HCC)    HLD (hyperlipidemia)    HTN (hypertension)    Hypertension    Insomnia    Schizophrenia (HCC)    TIA (transient ischemic attack)     Past Surgical History:   Procedure Laterality Date   NO PAST SURGERIES      Family Psychiatric History: None reported  Family History:  Family History  Problem Relation Age of Onset   Colon cancer Father        ?    Social History:  Social History   Socioeconomic History   Marital status: Single    Spouse name: Not on file   Number of children: 0   Years of education: Not on file   Highest education level: Not on file  Occupational History   Not on file  Tobacco Use   Smoking status: Former    Types: Cigarettes   Smokeless tobacco: Never   Tobacco comments:    Quit smoking over 40 years ago -02/06/2022  Vaping Use   Vaping status: Never Used  Substance and Sexual Activity   Alcohol use: No    Alcohol/week: 0.0 standard drinks of alcohol   Drug use: No   Sexual activity: Not Currently  Other Topics Concern   Not on file  Social History Narrative   Not on file   Social Determinants of Health   Financial Resource Strain: Not on file  Food Insecurity: Low Risk  (03/31/2023)   Received [facility]   Food Insecurity    Within the past 12 months, did the food you bought just not last and you didn't have money to get more?: No    Within the past 12 months, did you worry that your  food would run out before you got money to buy more?: No  Transportation Needs: Low Risk  (03/31/2023)   [facility] from [facility]   Transportation Needs    Within the past 12 months, has a lack of transportation kept you from medical appointments or from doing things needed for daily living?: No  Physical Activity: Not on file  Stress: Not on file  Social Connections: Not on file    Allergies: No Known Allergies  Metabolic Disorder Labs: Lab Results  Component Value Date   HGBA1C 6.0 (H) 12/21/2022   MPG 126 12/21/2022   MPG 119.76 12/02/2022   Lab Results  Component Value Date   PROLACTIN 20.9 (H) 04/30/2022   Lab Results  Component Value Date   CHOL 96 02/19/2023    TRIG 30 02/19/2023   HDL 40 02/19/2023   CHOLHDL 2.4 02/19/2023   VLDL 6 12/21/2022   LDLCALC 47 02/19/2023   LDLCALC 66 12/21/2022   Lab Results  Component Value Date   TSH 1.69 02/19/2023   TSH 1.893 12/21/2022    Therapeutic Level Labs: Lab Results  Component Value Date   LITHIUM 0.53 (L) 03/03/2014   Lab Results  Component Value Date   VALPROATE 113.4 (H) 02/22/2014   No results found for: "CBMZ"  Current Medications: Current Outpatient Medications  Medication Sig Dispense Refill   aspirin EC (ASPIRIN 81) 81 MG tablet Take 1 tablet (81 mg total) by mouth daily. 30 tablet 5   atorvastatin (LIPITOR) 40 MG tablet Take 1 tablet (40 mg total) by mouth daily. 30 tablet 6   benztropine (COGENTIN) 0.5 MG tablet Take 1 tablet (0.5 mg total) by mouth 3 (three) times daily as needed for EPS prevention. 45 tablet 1   clopidogrel (PLAVIX) 75 MG tablet Take 1 tablet (75 mg total) by mouth daily. 30 tablet 1   divalproex (DEPAKOTE ER) 500 MG 24 hr tablet Take 1 tablet (500 mg total) by mouth 2 (two) times daily. 60 tablet 6   famotidine (PEPCID) 20 MG tablet Take 1 tablet (20 mg total) by mouth in the morning. 30 tablet 2   haloperidol decanoate (HALDOL DECANOATE) 100 MG/ML injection Inject 1 mL (100 mg total) into the muscle every 28 (twenty-eight) days. 1 mL 11   hydrochlorothiazide (MICROZIDE) 12.5 MG capsule Take 1 capsule (12.5 mg total) by mouth daily for high blood pressure. 15 capsule 1   losartan (COZAAR) 100 MG tablet Take 1 tablet (100 mg total) by mouth daily for high blood pressure. 15 tablet 1   metoprolol succinate (TOPROL-XL) 50 MG 24 hr tablet Take 1 tablet (50 mg total) by mouth daily. 30 tablet 5   traZODone (DESYREL) 100 MG tablet Take 1 tablet (100 mg total) by mouth at bedtime as needed for sleep 30 tablet 5   valbenazine (INGREZZA) 40 MG capsule Take 1 capsule (40 mg total) by mouth daily. 30 capsule 1   No current facility-administered medications for this  visit.     Musculoskeletal: Strength & Muscle Tone: within normal limits Gait & Station: normal Patient leans: N/A  Psychiatric Specialty Exam: Review of Systems  Constitutional:  Negative for activity change and unexpected weight change.       Denies akathisia  Cardiovascular:  Negative for chest pain.  Gastrointestinal: Negative.   Neurological:  Negative for dizziness, seizures and headaches.    There were no vitals taken for this visit.There is no height or weight on file to calculate BMI.  See  nursing note from [facility] visit  General Appearance: Casual  Eye Contact:  Good  Speech:  Clear and Coherent and Normal Rate  Volume:  Normal  Mood:  Euthymic  Affect:  Congruent  Thought Process:  Coherent, Goal Directed, and Linear  Orientation:  Full (Time, Place, and Person)  Thought Content: WDL, Logical, and mostly logical    Suicidal Thoughts:  No  Homicidal Thoughts:  No  Memory:  Immediate;   Good Recent;   Good Remote;   Good  Judgement:  Good  Insight:  Fair, improved  Psychomotor Activity:  Normal  Concentration:  Concentration: Good and Attention Span: Good  Recall:  Good  Fund of Knowledge: Good  Language: Good  Akathisia:  No  Handed:  Right  AIMS (if indicated): 2  Assets:  Communication Skills Desire for Improvement Financial Resources/Insurance Housing Physical Health Social Support  ADL's:  Intact  Cognition: WNL  Sleep:  Good   Screenings: AIMS    Flowsheet Row ED from 03/17/2023 in [facility] Emergency Department at [facility] Office Visit from 07/23/2022 in [facility] Office Visit from 02/06/2022 in [facility]  AIMS Total Score 0 0 6      AUDIT    Flowsheet Row [facility]  Alcohol Use Disorder Identification Test Final Score (AUDIT) 0      GAD-7    Flowsheet Row Office Visit from 04/30/2022 in  [facility]  Total GAD-7 Score 0      PHQ2-9    Flowsheet Row Office Visit from 01/28/2023 in [facility] Office Visit from 04/30/2022 in [facility] Office Visit from 12/09/2017 in [facility] Of [place]. [facility] Office Visit from 04/16/2017 in [facility] - [facility] Dept Of [facility]. [facility] Office Visit from 03/04/2017 in [facility] Of [place]. [facility]  PHQ-2 Total Score 0 0 0 0 0      Flowsheet Row ED from 03/17/2023 in [facility] Department at [facility] ED from 12/21/2022 in [facility] ED to Hosp-Admission (Discharged) from 12/01/2022 in [facility]  C-SSRS RISK CATEGORY No Risk No Risk No Risk        Assessment and Plan: Patient reports that his anxiety, depression, and psychosis are well managed and denies presence today. He appears calm and euthymic, and remains able to rationalize and differentiate good from bad decisions.  As well, he has improved insight into his health and limitations that is causes in his ability to work.  He notes sustained improvements of his TD symptoms overall, but is still noted some perioral involuntary movements. Noted signs of TD today by this writer: mild involuntary perioral movements. These movements did not dissipate with distraction.  Patient agreeable to increasing to Ingrezza 60 mg for his TD symptoms, and continuing Haldol Dec [facility]I.  We will also start working on decreasing medication burden by consolidating dosages and discontinuing futile medications or those causing adverse effects.  Patient is in agreement with this plan.  1. Schizoaffective disorder, bipolar type (HCC)  - Continue Haldol Decanoate 100 mg/ml q28 days (last IM received 9/19) - Increase to Ingrezza 60 mg daily due to  neuroleptic-induced TD -Consolidate Depakote ER dosage to 1000 mg  nightly instead of 500 mg twice daily  Level therapeutic at 62 on 9/10. Will want to keep an eye on this and maintain within the lower end of the range as patient ages. Not a true trough, however, so will need to repeat.  We will be able to get a more accurate trough level with dosage consolidation. -Discontinue trazodone 100 mg at bedtime as patient has not required it. -Discontinue Cogentin 0.5 mg due to adverse effects.  Patient advised that he can continue to have medication on hand in the case of EPS, but to no longer take scheduled.  Collaboration of Care: Collaboration of Care: Other provider involved in patient's care [facility] PCP and shot clinic staff  Patient/Guardian was advised Release of Information must be obtained prior to any record release in order to collaborate their care with an outside provider. Patient/Guardian was advised if they have not already done so to contact the registration department to sign all necessary forms in order for [place] to release information regarding their care.   Consent: Patient/Guardian gives verbal consent for treatment and assignment of benefits for services provided during this visit. Patient/Guardian expressed understanding and agreed to proceed.   Follow up 12/12 for shot clinic Follow up for medication management 07/29/2023.  Will schedule labs visit prior to medication management follow-up appointment.   Lamar Sprinkles, MD 06/03/2023 1:34 PM

## 2023-06-03 NOTE — Telephone Encounter (Signed)
Called [facility] to discuss delivery of patients Haldol. They state that it can't be approved due to not being on formulary. Called medicaid and was told that he has MCD/MCR and that it must go through Medicare first. Unable to confirm patient has Medicare. Medicare states he is not active but MCD shows he is.

## 2023-06-03 NOTE — Progress Notes (Signed)
 Patient arrived for injection of Haldol 100mg . He did not bring his medication. Called [facility] and was told that his insurance is not approving his medication due to not being on formulary. Sample given today. Given in his Left Deltoid without issue or complaint. Prior authorization will be obtained before his next injection. Complaint of grinding his teeth. Also has hypertension. BP 160/101 with a recheck at 170/88. MD notified and had a visit with patient. Pleasant, cooperative with flat affect.

## 2023-06-08 ENCOUNTER — Other Ambulatory Visit (HOSPITAL_COMMUNITY): Payer: Self-pay

## 2023-06-10 ENCOUNTER — Telehealth: Payer: Self-pay | Admitting: Nurse Practitioner

## 2023-06-10 NOTE — Telephone Encounter (Signed)
DD, refer to office visit 05/17/2023, cardiac clearance was requested  prior to scheduling a colonoscopy. A cardiology referral was entered at that time. Pls contact patient to verify if he received a call from cardiology, if not, pls contact cardiology to facilitate scheduling appointment for cardiac clearance due to patient having a TIA 5 months ago, intermittent dizziness and an irregular heart rhythm on exam. THX.

## 2023-06-15 NOTE — Telephone Encounter (Signed)
DD, pls contact patient and schedule him for office follow up appt Jan 2025. If you do not reach him by phone, pls send him a letter asking him to contact our office to schedule a follow up appt. THX.

## 2023-06-15 NOTE — Telephone Encounter (Signed)
Contacted [facility] & scheduler stated that they attempted to reach out to the patient twice and has not gotten an answer.

## 2023-06-18 ENCOUNTER — Other Ambulatory Visit (HOSPITAL_COMMUNITY): Payer: Self-pay | Admitting: Student

## 2023-06-18 MED ORDER — VALBENAZINE TOSYLATE 60 MG PO CAPS: 60.0000 mg | ORAL_CAPSULE | Freq: Every day | ORAL | 1 refills | Status: DC

## 2023-06-18 MED ORDER — DIVALPROEX SODIUM ER 500 MG PO TB24: 1000.0000 mg | ORAL_TABLET | Freq: Every day | ORAL | 1 refills | Status: DC

## 2023-06-18 MED ORDER — BENZTROPINE MESYLATE 0.5 MG PO TABS: 0.5000 mg | ORAL_TABLET | Freq: Every day | ORAL | 1 refills | Status: DC | PRN

## 2023-06-18 NOTE — Addendum Note (Signed)
Addended by: Stormy Card on: 06/18/2023 04:20 PM   Modules accepted: Orders

## 2023-07-01 ENCOUNTER — Ambulatory Visit (HOSPITAL_COMMUNITY): Payer: MEDICAID

## 2023-07-01 MED ORDER — HALOPERIDOL DECANOATE 100 MG/ML IM SOLN
100.0000 mg | Freq: Once | INTRAMUSCULAR | Status: AC
  Administered 2023-07-01: 100 mg via INTRAMUSCULAR

## 2023-07-01 NOTE — Progress Notes (Cosign Needed)
Patient arrived for injection of Haldol 100mg . He did not bring his medication. We provided a sample . Pt was pleasant and played  his trumpet for [place]. Pt was given injection in right deltoid,

## 2023-07-02 ENCOUNTER — Other Ambulatory Visit (HOSPITAL_BASED_OUTPATIENT_CLINIC_OR_DEPARTMENT_OTHER): Payer: Self-pay

## 2023-07-05 ENCOUNTER — Other Ambulatory Visit (HOSPITAL_BASED_OUTPATIENT_CLINIC_OR_DEPARTMENT_OTHER): Payer: Self-pay

## 2023-07-06 ENCOUNTER — Other Ambulatory Visit (HOSPITAL_BASED_OUTPATIENT_CLINIC_OR_DEPARTMENT_OTHER): Payer: Self-pay

## 2023-07-12 ENCOUNTER — Other Ambulatory Visit (HOSPITAL_BASED_OUTPATIENT_CLINIC_OR_DEPARTMENT_OTHER): Payer: Self-pay

## 2023-07-12 MED ORDER — LOSARTAN POTASSIUM 50 MG PO TABS
50.0000 mg | ORAL_TABLET | Freq: Every day | ORAL | 5 refills | Status: AC
  Filled 2023-07-12: qty 90, 90d supply, fill #0
  Filled 2023-10-04: qty 90, 90d supply, fill #1
  Filled 2023-10-05: qty 90, 90d supply, fill #0

## 2023-07-12 MED ORDER — HYDROCHLOROTHIAZIDE 12.5 MG PO CAPS
12.5000 mg | ORAL_CAPSULE | Freq: Every day | ORAL | 2 refills | Status: AC
  Filled 2023-07-12 – 2024-01-12 (×2): qty 90, 90d supply, fill #0
  Filled 2024-01-12: qty 90, 90d supply, fill #1

## 2023-07-29 ENCOUNTER — Ambulatory Visit (HOSPITAL_COMMUNITY): Payer: MEDICAID | Admitting: Student

## 2023-07-29 ENCOUNTER — Encounter (HOSPITAL_COMMUNITY): Payer: Self-pay

## 2023-07-29 ENCOUNTER — Other Ambulatory Visit: Payer: Self-pay

## 2023-07-29 ENCOUNTER — Other Ambulatory Visit (HOSPITAL_BASED_OUTPATIENT_CLINIC_OR_DEPARTMENT_OTHER): Payer: Self-pay

## 2023-07-29 ENCOUNTER — Ambulatory Visit (INDEPENDENT_AMBULATORY_CARE_PROVIDER_SITE_OTHER): Payer: MEDICAID

## 2023-07-29 VITALS — BP 154/81 | HR 89 | Ht 70.0 in | Wt 174.0 lb

## 2023-07-29 DIAGNOSIS — G2401 Drug induced subacute dyskinesia: Secondary | ICD-10-CM

## 2023-07-29 DIAGNOSIS — F250 Schizoaffective disorder, bipolar type: Secondary | ICD-10-CM | POA: Diagnosis not present

## 2023-07-29 DIAGNOSIS — T43505A Adverse effect of unspecified antipsychotics and neuroleptics, initial encounter: Secondary | ICD-10-CM

## 2023-07-29 DIAGNOSIS — E222 Syndrome of inappropriate secretion of antidiuretic hormone: Secondary | ICD-10-CM

## 2023-07-29 MED ORDER — FAMOTIDINE 20 MG PO TABS
20.0000 mg | ORAL_TABLET | Freq: Every morning | ORAL | 2 refills | Status: DC
  Filled 2023-08-09: qty 28, 28d supply, fill #0
  Filled 2023-09-06 – 2023-10-12 (×2): qty 30, 30d supply, fill #0

## 2023-07-29 MED ORDER — HALOPERIDOL DECANOATE 100 MG/ML IM SOLN
100.0000 mg | Freq: Once | INTRAMUSCULAR | Status: AC
  Administered 2023-07-29: 100 mg via INTRAMUSCULAR

## 2023-07-29 NOTE — Progress Notes (Cosign Needed Addendum)
 Pt presents today for the injection of Haldol  100 mg. Pt was administered injection into left deltoid, in which was tolerated successfully. Patient states that he is feeling well, minus the exception  of his legs feeling odd mentions slight pian from aging and from injections in hip, which is why he prefers his arms now. Pt was advised to further address pain issues of lower body to PCP at next app for February. Patient is on BP medication in which he states that he will follow up with PCP about high blood pressure. BP was taken at the end of appointment still high 147/84.    Carmel kerns, CMA

## 2023-07-29 NOTE — Progress Notes (Signed)
 [facility] MD/PA/NP OP Progress Note  07/29/2023 2:34 PM  Jerry Carter  MRN:  [id number]  Chief Complaint:  Chief Complaint  Patient presents with   Follow-up   Medication Refill   HPI: Jerry Carter is a 63 year old male with a psychiatric history of schizoaffective disorder bipolar type and neuroleptic-induced tardive dyskinesia who is seen today for follow up psychiatric evaluation.  He is managed on Haldol  Decanoate 100 mg q 28, which he notes is effective in managing his psychiatric conditions.   Since last visit, patient reports good mood, sleeping well, and his appetite is intact. He denies AVH, SI, HI, paranoia. Denies TD sx as much.  Mostly, he believes that things are unchanged since his last visit.  He has a job interview with [employer] after this visit, to which he looks forward.  He reports medication compliance, but he is not aware of of all the medications he takes.  He simply follows the instructions on the prescription bottles.  He has difficulties with understanding when as needed medications are needed, but he is agreeable to having medications in bubble packs.   Patient denies tobacco product, alcohol, and illicit drug use.  Denies  manic symptoms since last visit.  He is tolerating his medications well without adverse effects and is also compliant with his LAI.     Visit Diagnosis:    ICD-10-CM   1. Schizoaffective disorder, bipolar type (HCC)  F25.0     2. Neuroleptic-induced tardive dyskinesia  G24.01    T43.505A           Past Psychiatric History: schizoaffective disorder and Neuroleptic-induced tardive dyskinesia   Past Medical History:  Past Medical History:  Diagnosis Date   Anxiety    Bipolar disorder (HCC)    HLD (hyperlipidemia)    HTN (hypertension)    Hypertension    Insomnia    Schizophrenia (HCC)    TIA (transient ischemic attack)     Past Surgical History:  Procedure Laterality Date   NO PAST SURGERIES      Family Psychiatric History:  None reported  Family History:  Family History  Problem Relation Age of Onset   Colon cancer Father        ?    Social History:  Social History   Socioeconomic History   Marital status: Single    Spouse name: Not on file   Number of children: 0   Years of education: Not on file   Highest education level: Not on file  Occupational History   Not on file  Tobacco Use   Smoking status: Former    Types: Cigarettes   Smokeless tobacco: Never   Tobacco comments:    Quit smoking over 40 years ago -02/06/2022  Vaping Use   Vaping status: Never Used  Substance and Sexual Activity   Alcohol use: No    Alcohol/week: 0.0 standard drinks of alcohol   Drug use: No   Sexual activity: Not Currently  Other Topics Concern   Not on file  Social History Narrative   Not on file   Social Drivers of Health   Financial Resource Strain: Not on file  Food Insecurity: Low Risk  (03/31/2023)   Received from [facility]   Food Insecurity    Within the past 12 months, did the food you bought just not last and you didn't have money to get more?: No    Within the past 12 months, did you worry that your food would  run out before you got money to buy more?: No  Transportation Needs: Low Risk  (03/31/2023)   Received from [facility]   Transportation Needs    Within the past 12 months, has a lack of transportation kept you from medical appointments or from doing things needed for daily living?: No  Physical Activity: Not on file  Stress: Not on file  Social Connections: Not on file    Allergies: No Known Allergies  Metabolic Disorder Labs: Lab Results  Component Value Date   HGBA1C 6.0 (H) 12/21/2022   MPG 126 12/21/2022   MPG 119.76 12/02/2022   Lab Results  Component Value Date   PROLACTIN 20.9 (H) 04/30/2022   Lab Results  Component Value Date   CHOL 96 02/19/2023   TRIG 30 02/19/2023   HDL 40 02/19/2023   CHOLHDL 2.4 02/19/2023   VLDL 6 12/21/2022    LDLCALC 47 02/19/2023   LDLCALC 66 12/21/2022   Lab Results  Component Value Date   TSH 1.69 02/19/2023   TSH 1.893 12/21/2022    Therapeutic Level Labs: Lab Results  Component Value Date   LITHIUM  0.53 (L) 03/03/2014   Lab Results  Component Value Date   VALPROATE 113.4 (H) 02/22/2014   No results found for: CBMZ  Current Medications: Current Outpatient Medications  Medication Sig Dispense Refill   aspirin  EC (ASPIRIN  81) 81 MG tablet Take 1 tablet (81 mg total) by mouth daily. 30 tablet 5   atorvastatin  (LIPITOR) 40 MG tablet Take 1 tablet (40 mg total) by mouth daily. 30 tablet 6   clopidogrel  (PLAVIX ) 75 MG tablet Take 1 tablet (75 mg total) by mouth daily. 30 tablet 1   haloperidol  decanoate (HALDOL  DECANOATE) 100 MG/ML injection Inject 1 mL (100 mg total) into the muscle every 28 (twenty-eight) days. 1 mL 11   hydrochlorothiazide  (MICROZIDE ) 12.5 MG capsule Take 1 capsule (12.5 mg total) by mouth daily. 90 capsule 2   losartan  (COZAAR ) 50 MG tablet Take 1 tablet (50 mg total) by mouth daily. 30 tablet 5   metoprolol  succinate (TOPROL -XL) 50 MG 24 hr tablet Take 1 tablet (50 mg total) by mouth daily. 30 tablet 5   benztropine  (COGENTIN ) 0.5 MG tablet Take 1 tablet (0.5 mg total) by mouth daily as needed for tremors. Only take if you notice tremors or stiffness. Otherwise, do not take medication. 30 tablet 1   divalproex  (DEPAKOTE  ER) 500 MG 24 hr tablet Take 2 tablets (1,000 mg total) by mouth at bedtime. 60 tablet 1   famotidine  (PEPCID ) 20 MG tablet Take 1 tablet (20 mg total) by mouth every morning. 30 tablet 2   losartan  (COZAAR ) 100 MG tablet Take 1 tablet (100 mg total) by mouth daily for high blood pressure. 15 tablet 1   valbenazine  (INGREZZA ) 60 MG capsule Take 1 capsule (60 mg total) by mouth daily. 30 capsule 1   No current facility-administered medications for this visit.     Musculoskeletal: Strength & Muscle Tone: within normal limits Gait & Station:  normal Patient leans: N/A  Psychiatric Specialty Exam: Review of Systems  Constitutional:  Negative for activity change and unexpected weight change.       Denies akathisia  Cardiovascular:  Negative for chest pain.  Gastrointestinal: Negative.   Neurological: Negative.  Negative for dizziness, seizures and headaches.    There were no vitals taken for this visit.There is no height or weight on file to calculate BMI.  See nursing note from LAI  visit  General Appearance: Casual  Eye Contact:  Good  Speech:  Clear and Coherent and Normal Rate  Volume:  Normal  Mood:  Euthymic  Affect:  Congruent  Thought Process:  Coherent, Goal Directed, and Linear  Orientation:  Full (Time, Place, and Person)  Thought Content: WDL, Logical, and mostly logical    Suicidal Thoughts:  No  Homicidal Thoughts:  No  Memory:  Immediate;   Good Recent;   Good Remote;   Good  Judgement:  Good  Insight:  Fair, improved  Psychomotor Activity:  Normal  Concentration:  Concentration: Good and Attention Span: Good  Recall:  Good  Fund of Knowledge: Good  Language: Good  Akathisia:  No  Handed:  Right  AIMS (if indicated): 2  Assets:  Communication Skills Desire for Improvement Financial Resources/Insurance Housing Physical Health Social Support  ADL's:  Intact  Cognition: WNL  Sleep:  Good   Screenings: AIMS    Flowsheet Row ED from 03/17/2023 in [facility] Emergency Department at [facility] Office Visit from 07/23/2022 in [facility] Office Visit from 02/06/2022 in [facility]  AIMS Total Score 0 0 6      AUDIT    Flowsheet Row [facility]  Alcohol Use Disorder Identification Test Final Score (AUDIT) 0      GAD-7    Flowsheet Row Office Visit from 04/30/2022 in [facility]  Total GAD-7 Score 0      PHQ2-9     Flowsheet Row Office Visit from 01/28/2023 in [facility] Office Visit from 04/30/2022 in [facility] Office Visit from 12/09/2017 in [facility] Of [place] Office Visit from 04/16/2017 in [facility] - [facility] Of [facility] Office Visit from 03/04/2017 in [facility] Of [place]. [facility]  PHQ-2 Total Score 0 0 0 0 0      Flowsheet Row ED from 03/17/2023 in [facility] Emergency Department at [facility] ED from 12/21/2022 in [facility] ED to Hosp-Admission (Discharged) from 12/01/2022 in [facility]  C-SSRS RISK CATEGORY No Risk No Risk No Risk        Assessment and Plan: Patient reports that his anxiety, depression, and psychosis are well managed and denies presence today. He appears calm and euthymic, and remains able to rationalize and differentiate good from bad decisions. He notes sustained improvements of his TD symptoms overall, but it is still noted some involuntary movements. Noted signs of TD today by this writer: mild involuntary forehead movements. These movements did not dissipate with distraction.  Patient agreeable to continue medications as prescribed switching pharmacies so that medications can be placed in [facility].  We will continue working on decreasing medication burden by consolidating dosages and discontinuing futile medications or those causing adverse effects.  Patient is in agreement with this plan.  1. Schizoaffective disorder, bipolar type (HCC)  - Continue Haldol  Decanoate 100 mg/ml q28 days -Continue Ingrezza  60 mg daily due to neuroleptic-induced TD -Continue Depakote  ER 1000 mg nightly  Level therapeutic at 64 on 9/10. Will want to keep an eye on this and maintain within the lower end of the range as patient ages. Not a true trough,  however, so  will need to repeat.  We will be able to get a more accurate trough level with dosage consolidation. -Restart Cogentin  0.5 mg daily, as patient takes Haldol , to prevent EPS; scheduling daily dose as patient with difficulty determining when to take medications as needed.  He had continued to take scheduled.  Collaboration of Care: Collaboration of Care: Other provider involved in patient's care AEB PCP and shot clinic staff  Patient/Guardian was advised Release of Information must be obtained prior to any record release in order to collaborate their care with an outside provider. Patient/Guardian was advised if they have not already done so to contact the registration department to sign all necessary forms in order for us  to release information regarding their care.   Consent: Patient/Guardian gives verbal consent for treatment and assignment of benefits for services provided during this visit. Patient/Guardian expressed understanding and agreed to proceed.    Charmaine Myrtle, MD 07/29/2023 2:34 PM

## 2023-08-03 ENCOUNTER — Telehealth (HOSPITAL_COMMUNITY): Payer: Self-pay | Admitting: Psychiatry

## 2023-08-03 MED ORDER — DIVALPROEX SODIUM ER 500 MG PO TB24: 1000.0000 mg | ORAL_TABLET | Freq: Every day | ORAL | 1 refills | Status: DC

## 2023-08-03 MED ORDER — BENZTROPINE MESYLATE 0.5 MG PO TABS: 0.5000 mg | ORAL_TABLET | Freq: Every day | ORAL | 1 refills | Status: DC | PRN

## 2023-08-03 MED ORDER — VALBENAZINE TOSYLATE 60 MG PO CAPS: 60.0000 mg | ORAL_CAPSULE | Freq: Every day | ORAL | 1 refills | Status: DC

## 2023-08-03 NOTE — Telephone Encounter (Signed)
 D:  Pt called and left this case manager a vm stating he would like to talk to the doctor re: his medications.  A:  Will message Dr. Alfonse Flavors and her nursing staff.

## 2023-08-05 ENCOUNTER — Ambulatory Visit (HOSPITAL_COMMUNITY): Payer: MEDICAID

## 2023-08-09 ENCOUNTER — Other Ambulatory Visit (HOSPITAL_BASED_OUTPATIENT_CLINIC_OR_DEPARTMENT_OTHER): Payer: Self-pay

## 2023-08-10 ENCOUNTER — Other Ambulatory Visit (HOSPITAL_BASED_OUTPATIENT_CLINIC_OR_DEPARTMENT_OTHER): Payer: Self-pay

## 2023-08-11 ENCOUNTER — Encounter (HOSPITAL_COMMUNITY): Payer: Self-pay | Admitting: Student

## 2023-08-12 ENCOUNTER — Other Ambulatory Visit (HOSPITAL_BASED_OUTPATIENT_CLINIC_OR_DEPARTMENT_OTHER): Payer: Self-pay

## 2023-08-26 ENCOUNTER — Ambulatory Visit (INDEPENDENT_AMBULATORY_CARE_PROVIDER_SITE_OTHER): Payer: MEDICAID

## 2023-08-26 VITALS — BP 140/90 | HR 88 | Ht 70.0 in | Wt 182.0 lb

## 2023-08-26 DIAGNOSIS — E222 Syndrome of inappropriate secretion of antidiuretic hormone: Secondary | ICD-10-CM

## 2023-08-26 DIAGNOSIS — F250 Schizoaffective disorder, bipolar type: Secondary | ICD-10-CM

## 2023-08-26 MED ORDER — HALOPERIDOL DECANOATE 100 MG/ML IM SOLN
100.0000 mg | Freq: Once | INTRAMUSCULAR | Status: AC
  Administered 2023-08-26: 100 mg via INTRAMUSCULAR

## 2023-08-26 NOTE — Progress Notes (Cosign Needed Addendum)
 Pt presents today for injection of Haldol   100 mg/ 1 ML in right deltoid.   . PT states that he has been just fine no pain bothering him besides in his legs which he states that he has to go pick up medication. Patient denies any AVH,SI, and HI, Pt presents to be in good sprits and was also able to show me his 2025 plans for the year.

## 2023-09-01 ENCOUNTER — Other Ambulatory Visit (HOSPITAL_BASED_OUTPATIENT_CLINIC_OR_DEPARTMENT_OTHER): Payer: Self-pay

## 2023-09-06 ENCOUNTER — Other Ambulatory Visit (HOSPITAL_COMMUNITY): Payer: Self-pay

## 2023-09-06 ENCOUNTER — Other Ambulatory Visit (HOSPITAL_BASED_OUTPATIENT_CLINIC_OR_DEPARTMENT_OTHER): Payer: Self-pay

## 2023-09-06 ENCOUNTER — Other Ambulatory Visit: Payer: Self-pay

## 2023-09-07 ENCOUNTER — Other Ambulatory Visit (HOSPITAL_BASED_OUTPATIENT_CLINIC_OR_DEPARTMENT_OTHER): Payer: Self-pay

## 2023-09-07 ENCOUNTER — Other Ambulatory Visit: Payer: Self-pay

## 2023-09-23 ENCOUNTER — Other Ambulatory Visit: Payer: Self-pay

## 2023-09-23 ENCOUNTER — Ambulatory Visit (HOSPITAL_COMMUNITY): Payer: MEDICAID | Admitting: Student

## 2023-09-23 ENCOUNTER — Other Ambulatory Visit (HOSPITAL_BASED_OUTPATIENT_CLINIC_OR_DEPARTMENT_OTHER): Payer: Self-pay

## 2023-09-23 ENCOUNTER — Ambulatory Visit (INDEPENDENT_AMBULATORY_CARE_PROVIDER_SITE_OTHER): Payer: MEDICAID

## 2023-09-23 VITALS — BP 163/75 | HR 80 | Ht 70.0 in | Wt 186.0 lb

## 2023-09-23 DIAGNOSIS — Z79899 Other long term (current) drug therapy: Secondary | ICD-10-CM | POA: Diagnosis not present

## 2023-09-23 DIAGNOSIS — T43505A Adverse effect of unspecified antipsychotics and neuroleptics, initial encounter: Secondary | ICD-10-CM

## 2023-09-23 DIAGNOSIS — G2401 Drug induced subacute dyskinesia: Secondary | ICD-10-CM

## 2023-09-23 DIAGNOSIS — F250 Schizoaffective disorder, bipolar type: Secondary | ICD-10-CM | POA: Diagnosis not present

## 2023-09-23 MED ORDER — HALOPERIDOL DECANOATE 100 MG/ML IM SOLN
100.0000 mg | Freq: Once | INTRAMUSCULAR | Status: AC
  Administered 2023-09-23: 100 mg via INTRAMUSCULAR

## 2023-09-23 MED ORDER — VALBENAZINE TOSYLATE 60 MG PO CAPS
60.0000 mg | ORAL_CAPSULE | Freq: Every day | ORAL | 1 refills | Status: AC
  Filled 2023-09-23 – 2023-10-09 (×2): qty 30, 30d supply, fill #0

## 2023-09-23 MED ORDER — BENZTROPINE MESYLATE 0.5 MG PO TABS
0.5000 mg | ORAL_TABLET | Freq: Every day | ORAL | 1 refills | Status: DC
  Filled 2023-09-23 – 2023-12-14 (×4): qty 30, 30d supply, fill #0

## 2023-09-23 MED ORDER — DIVALPROEX SODIUM ER 500 MG PO TB24
1000.0000 mg | ORAL_TABLET | Freq: Every day | ORAL | 1 refills | Status: DC
  Filled 2023-09-23: qty 60, 30d supply, fill #0

## 2023-09-23 NOTE — Progress Notes (Deleted)
   Established Patient Office Visit  Subjective   Patient ID: Jerry Carter, male    DOB: 10-12-1960  Age: 63 y.o. MRN: [id number]  Chief Complaint  Patient presents with   Injections    HPI    ROS    Objective:     BP (!) 163/75 (BP Location: Right Arm, Patient Position: Sitting, Cuff Size: Normal) Comment: Pt states he took BP medication late but has probolems with his BP. Was noted too his main [facility] provider.  Pulse 80   Ht 5\' 10"  (1.778 m)   Wt 186 lb (84.4 kg)   SpO2 100%   BMI 26.69 kg/m    Physical Exam   No results found for any visits on 09/23/23.    The ASCVD Risk score (Arnett DK, et al., 2019) failed to calculate for the following reasons:   The valid total cholesterol range is 130 to 320 mg/dL    Assessment & Plan:   Problem List Items Addressed This Visit       Other   Schizoaffective disorder, bipolar type (HCC) - Primary    Return in about 4 weeks (around 10/21/2023).    Ja'Bron Charma Igo, CMA

## 2023-09-23 NOTE — Progress Notes (Signed)
 [facility] MD/PA/NP OP Progress Note  09/23/2023 5:07 PM  Jerry Carter  MRN:  [id number]  Chief Complaint:  Chief Complaint  Patient presents with   Follow-up   Medication Refill   Schizophrenia   HPI: Jerry Carter is a 63 year old male with a psychiatric history of schizoaffective disorder bipolar type and neuroleptic-induced tardive dyskinesia who is seen today for follow up psychiatric evaluation.  He is managed on Haldol Decanoate 100 mg q 28, which he notes is effective in managing his psychiatric conditions.   Since last visit, patient reports good mood, sleeping well, and his appetite is intact. He denies AVH, SI, HI, paranoia. Denies TD sx, and objectively, none are apparent.  Aims equals 0.  Mostly, he believes that things are unchanged since his last visit.  He had a job interview with [employer], and he was not offered the position, so he is rational in questioning whether he should just live off of his SSI as opposed to trying to obtain another job.  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  As opposed to previous visits just before a manic episode ensued, he is not grandiose in discussing multiple business ideas.  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  Additionally, he reports selling his home, and the ability to live off of those profits.  He is currently residing in a boardinghouse and feels safe with those with whom he lives.  He still has good support in his friend, Jerry Carter, who helps him to obtain his medications.  He reports medication compliance.  Although, he is taking his Depakote in the morning rather than at bedtime.  He denies daytime somnolence from doing so.   We discussed the need to obtain a Depakote trough, but since he just took his medications at 11 AM, we are unable to do so today.  Patient is instructed to hold his Depakote dose prior to his next LAI appointment, and this writer will remind him to do  such.   Patient denies tobacco product, alcohol, and illicit drug use.  Denies  manic symptoms since last visit.  He is tolerating his medications well without adverse effects and is also compliant with his LAI.     Visit Diagnosis:    ICD-10-CM   1. Long-term use of high-risk medication  Z79.899 Valproic Acid level    benztropine (COGENTIN) 0.5 MG tablet    2. Schizoaffective disorder, bipolar type (HCC)  F25.0 benztropine (COGENTIN) 0.5 MG tablet    divalproex (DEPAKOTE ER) 500 MG 24 hr tablet    3. Neuroleptic-induced tardive dyskinesia  G24.01 valbenazine (INGREZZA) 60 MG capsule   T43.505A            Past Psychiatric History: schizoaffective disorder and Neuroleptic-induced tardive dyskinesia   Past Medical History:  Past Medical History:  Diagnosis Date   Anxiety    Bipolar disorder (HCC)    HLD (hyperlipidemia)    HTN (hypertension)    Hypertension    Insomnia    Schizophrenia (HCC)    TIA (transient ischemic attack)     Past Surgical History:  Procedure Laterality Date   NO PAST SURGERIES      Family Psychiatric History: None reported  Family History:  Family History  Problem Relation Age of Onset   Colon cancer Father        ?    Social History:  Social History   Socioeconomic History   Marital  status: Single    Spouse name: Not on file   Number of children: 0   Years of education: Not on file   Highest education level: Not on file  Occupational History   Not on file  Tobacco Use   Smoking status: Former    Types: Cigarettes   Smokeless tobacco: Never   Tobacco comments:    Quit smoking over 40 years ago -02/06/2022  Vaping Use   Vaping status: Never Used  Substance and Sexual Activity   Alcohol use: No    Alcohol/week: 0.0 standard drinks of alcohol   Drug use: No   Sexual activity: Not Currently  Other Topics Concern   Not on file  Social History Narrative   Not on file   Social Drivers of Health   Financial Resource  Strain: Not on file  Food Insecurity: Low Risk  (03/31/2023)   Received from [facility]   Food Insecurity    Within the past 12 months, did the food you bought just not last and you didn't have money to get more?: No    Within the past 12 months, did you worry that your food would run out before you got money to buy more?: No  Transportation Needs: Low Risk  (03/31/2023)   Received from [facility]   Transportation Needs    Within the past 12 months, has a lack of transportation kept you from medical appointments or from doing things needed for daily living?: No  Physical Activity: Not on file  Stress: Not on file  Social Connections: Not on file    Allergies: No Known Allergies  Metabolic Disorder Labs: Lab Results  Component Value Date   HGBA1C 6.0 (H) 12/21/2022   MPG 126 12/21/2022   MPG 119.76 12/02/2022   Lab Results  Component Value Date   PROLACTIN 20.9 (H) 04/30/2022   Lab Results  Component Value Date   CHOL 96 02/19/2023   TRIG 30 02/19/2023   HDL 40 02/19/2023   CHOLHDL 2.4 02/19/2023   VLDL 6 12/21/2022   LDLCALC 47 02/19/2023   LDLCALC 66 12/21/2022   Lab Results  Component Value Date   TSH 1.69 02/19/2023   TSH 1.893 12/21/2022    Therapeutic Level Labs: Lab Results  Component Value Date   LITHIUM 0.53 (L) 03/03/2014   Lab Results  Component Value Date   VALPROATE 113.4 (H) 02/22/2014   No results found for: "CBMZ"  Current Medications: Current Outpatient Medications  Medication Sig Dispense Refill   aspirin EC (ASPIRIN 81) 81 MG tablet Take 1 tablet (81 mg total) by mouth daily. 30 tablet 5   atorvastatin (LIPITOR) 40 MG tablet Take 1 tablet (40 mg total) by mouth daily. 30 tablet 6   clopidogrel (PLAVIX) 75 MG tablet Take 1 tablet (75 mg total) by mouth daily. 30 tablet 1   famotidine (PEPCID) 20 MG tablet Take 1 tablet (20 mg total) by mouth every morning. 30 tablet 2   haloperidol decanoate (HALDOL  DECANOATE) 100 MG/ML injection Inject 1 mL (100 mg total) into the muscle every 28 (twenty-eight) days. 1 mL 11   hydrochlorothiazide (MICROZIDE) 12.5 MG capsule Take 1 capsule (12.5 mg total) by mouth daily. 90 capsule 2   losartan (COZAAR) 100 MG tablet Take 1 tablet (100 mg total) by mouth daily for high blood pressure. 15 tablet 1   losartan (COZAAR) 50 MG tablet Take 1 tablet (50 mg total) by mouth daily. 30 tablet 5  metoprolol succinate (TOPROL-XL) 50 MG 24 hr tablet Take 1 tablet (50 mg total) by mouth daily. 30 tablet 5   benztropine (COGENTIN) 0.5 MG tablet Take 1 tablet (0.5 mg total) by mouth daily. 30 tablet 1   divalproex (DEPAKOTE ER) 500 MG 24 hr tablet Take 2 tablets (1,000 mg total) by mouth at bedtime. 60 tablet 1   valbenazine (INGREZZA) 60 MG capsule Take 1 capsule (60 mg total) by mouth daily. 30 capsule 1   No current facility-administered medications for this visit.     Musculoskeletal: Strength & Muscle Tone: within normal limits Gait & Station: normal Patient leans: N/A  Psychiatric Specialty Exam: Review of Systems  Constitutional:  Negative for activity change and unexpected weight change.       Denies akathisia  Cardiovascular:  Negative for chest pain.  Gastrointestinal: Negative.   Neurological: Negative.  Negative for dizziness, seizures and headaches.    There were no vitals taken for this visit.There is no height or weight on file to calculate BMI.  See nursing note from LAI visit for vitals  General Appearance: Well Groomed and but somewhat bizarre with a small, green checkered hat  Eye Contact:  Good  Speech:  Clear and Coherent and Normal Rate  Volume:  Normal  Mood:  Euthymic  Affect:  Congruent  Thought Process:  Coherent, Goal Directed, and Linear  Orientation:  Full (Time, Place, and Person)  Thought Content: WDL and Logical   Suicidal Thoughts:  No  Homicidal Thoughts:  No  Memory:  Immediate;   Good Recent;   Good Remote;   Good   Judgement:  Good  Insight:  Fair, improved  Psychomotor Activity:  Normal  Concentration:  Concentration: Good and Attention Span: Good  Recall:  Good  Fund of Knowledge: Good  Language: Good  Akathisia:  No  Handed:  Right  AIMS (if indicated): 0  Assets:  Communication Skills Desire for Improvement Financial Resources/Insurance Housing Physical Health Social Support  ADL's:  Intact  Cognition: WNL  Sleep:  Good   Screenings: AIMS    Flowsheet Row ED from 03/17/2023 in [facility] Emergency Department at [facility] Office Visit from 07/23/2022 in [facility] Office Visit from 02/06/2022 in [facility]  AIMS Total Score 0 0 6      AUDIT    Flowsheet Row [facility]  Alcohol Use Disorder Identification Test Final Score (AUDIT) 0      GAD-7    Flowsheet Row Office Visit from 04/30/2022 in [facility]  Total GAD-7 Score 0      PHQ2-9    Flowsheet Row Office Visit from 01/28/2023 in [facility] Office Visit from 04/30/2022 in [facility] Office Visit from 12/09/2017 in [facility] . [facility] Office Visit from 04/16/2017 in [facility] Eligha Bridegroom. [facility] Office Visit from 03/04/2017 in [facility] . [facility]  PHQ-2 Total Score 0 0 0 0 0      Flowsheet Row ED from 03/17/2023 in [facility] Emergency Department at [facility] ED from 12/21/2022 in [facility] ED to Hosp-Admission (Discharged) from 12/01/2022 in [facility]  C-SSRS RISK CATEGORY No Risk  No Risk No Risk        Assessment and Plan: Patient reports that his anxiety, depression, and psychosis are well  managed and again denies presence today. He appears calm and euthymic, and remains able to rationalize and differentiate good from bad decisions.  He does have some hyperverbal speech and hyperreligiosity when discussing his book, but this is baseline for him, and he is interruptible.  Preceding previous manic episodes, patient was very grandiose in discussing multiple business ideas and projects, but today he only discusses the desire to finish his book.  Do not believe that we are entering a manic episode, but patient's medication may be wearing off on the day his injection is due.  We will continue to keep a close eye on this.  He notes sustained improvements of his TD symptoms overall, and none are noted.  He reports improved compliance with medications by consolidating Depakote dosage, although he has been taking in the morning rather than at bedtime.  Because of this, we will not obtain a valproic acid trough level today, but prior to his next LAI visit, in which he will be advised to hold his Depakote dosage.  Patient poses no safety concerns toward himself nor others at this time.  Patient is in agreement with this plan.  1. Schizoaffective disorder, bipolar type (HCC)  - Continue Haldol Decanoate 100 mg/ml q28 days -Continue Ingrezza 60 mg daily due to neuroleptic-induced TD -Continue Depakote ER 1000 mg nightly (patient taking every morning)  Level therapeutic at 64 on 9/10. Will want to keep an eye on this and maintain within the lower end of the range as patient ages. Not a true trough, however, so will need to repeat before next LAI visit, particularly now that patient has consolidated his dosage.   -Continue Cogentin 0.5 mg daily, as patient takes Haldol, to prevent EPS; scheduling daily dose as patient with difficulty determining when to take medications as needed.  He had continued to take scheduled.  Collaboration of Care: Collaboration of Care: Other provider involved in patient's care  [facility] PCP and shot clinic staff  Patient/Guardian was advised Release of Information must be obtained prior to any record release in order to collaborate their care with an outside provider. Patient/Guardian was advised if they have not already done so to contact the registration department to sign all necessary forms in order for [place] to release information regarding their care.   Consent: Patient/Guardian gives verbal consent for treatment and assignment of benefits for services provided during this visit. Patient/Guardian expressed understanding and agreed to proceed.    Lamar Sprinkles, MD 09/23/2023 5:07 PM

## 2023-09-23 NOTE — Progress Notes (Signed)
 Pt presents today for injection of haldol which was administered in Left deltoid. Pt states he has no complaints for provider.     Pt states that he is doing well mentally and denies any AVH,SI, and HI. 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

## 2023-09-24 ENCOUNTER — Encounter (HOSPITAL_COMMUNITY): Payer: Self-pay | Admitting: Student

## 2023-09-25 ENCOUNTER — Other Ambulatory Visit (HOSPITAL_BASED_OUTPATIENT_CLINIC_OR_DEPARTMENT_OTHER): Payer: Self-pay

## 2023-09-27 ENCOUNTER — Other Ambulatory Visit: Payer: Self-pay

## 2023-09-29 ENCOUNTER — Other Ambulatory Visit (HOSPITAL_BASED_OUTPATIENT_CLINIC_OR_DEPARTMENT_OTHER): Payer: Self-pay

## 2023-09-29 ENCOUNTER — Other Ambulatory Visit: Payer: Self-pay

## 2023-09-30 ENCOUNTER — Other Ambulatory Visit (HOSPITAL_BASED_OUTPATIENT_CLINIC_OR_DEPARTMENT_OTHER): Payer: Self-pay

## 2023-09-30 MED ORDER — METOPROLOL SUCCINATE ER 50 MG PO TB24
50.0000 mg | ORAL_TABLET | Freq: Every day | ORAL | 5 refills | Status: DC
  Filled 2023-09-30 – 2023-10-05 (×3): qty 30, 30d supply, fill #0
  Filled 2023-10-29 – 2023-11-11 (×3): qty 30, 30d supply, fill #1
  Filled 2023-12-04 – 2023-12-14 (×3): qty 30, 30d supply, fill #2
  Filled 2024-01-06 – 2024-01-12 (×2): qty 30, 30d supply, fill #3
  Filled 2024-01-12: qty 30, 30d supply, fill #0
  Filled 2024-02-10: qty 30, 30d supply, fill #1
  Filled 2024-03-13: qty 30, 30d supply, fill #2

## 2023-10-01 ENCOUNTER — Other Ambulatory Visit (HOSPITAL_BASED_OUTPATIENT_CLINIC_OR_DEPARTMENT_OTHER): Payer: Self-pay

## 2023-10-04 ENCOUNTER — Other Ambulatory Visit (HOSPITAL_BASED_OUTPATIENT_CLINIC_OR_DEPARTMENT_OTHER): Payer: Self-pay

## 2023-10-05 ENCOUNTER — Other Ambulatory Visit (HOSPITAL_BASED_OUTPATIENT_CLINIC_OR_DEPARTMENT_OTHER): Payer: Self-pay

## 2023-10-05 ENCOUNTER — Other Ambulatory Visit (HOSPITAL_COMMUNITY): Payer: Self-pay

## 2023-10-05 ENCOUNTER — Other Ambulatory Visit: Payer: Self-pay

## 2023-10-05 MED ORDER — IBUPROFEN 800 MG PO TABS
800.0000 mg | ORAL_TABLET | Freq: Four times a day (QID) | ORAL | 0 refills | Status: DC
  Filled 2023-10-05: qty 30, 8d supply, fill #0

## 2023-10-06 ENCOUNTER — Other Ambulatory Visit: Payer: Self-pay

## 2023-10-09 ENCOUNTER — Other Ambulatory Visit (HOSPITAL_BASED_OUTPATIENT_CLINIC_OR_DEPARTMENT_OTHER): Payer: Self-pay

## 2023-10-12 ENCOUNTER — Other Ambulatory Visit (HOSPITAL_COMMUNITY): Payer: Self-pay

## 2023-10-12 ENCOUNTER — Other Ambulatory Visit (HOSPITAL_BASED_OUTPATIENT_CLINIC_OR_DEPARTMENT_OTHER): Payer: Self-pay

## 2023-10-13 ENCOUNTER — Other Ambulatory Visit (HOSPITAL_COMMUNITY): Payer: Self-pay

## 2023-10-14 ENCOUNTER — Other Ambulatory Visit (HOSPITAL_COMMUNITY): Payer: Self-pay

## 2023-10-15 ENCOUNTER — Other Ambulatory Visit (HOSPITAL_COMMUNITY): Payer: Self-pay

## 2023-10-15 ENCOUNTER — Other Ambulatory Visit: Payer: Self-pay

## 2023-10-15 MED ORDER — ASPIRIN 81 MG PO TBEC
81.0000 mg | DELAYED_RELEASE_TABLET | Freq: Every day | ORAL | 5 refills | Status: DC
  Filled 2023-10-15: qty 30, 30d supply, fill #0
  Filled 2023-11-08 – 2023-11-11 (×3): qty 30, 30d supply, fill #1
  Filled 2023-12-04: qty 30, 30d supply, fill #2
  Filled 2023-12-14: qty 30, 30d supply, fill #0
  Filled 2024-01-06: qty 30, 30d supply, fill #1
  Filled 2024-01-12: qty 30, 30d supply, fill #0
  Filled 2024-01-12 – 2024-02-10 (×2): qty 30, 30d supply, fill #1

## 2023-10-18 ENCOUNTER — Other Ambulatory Visit (HOSPITAL_BASED_OUTPATIENT_CLINIC_OR_DEPARTMENT_OTHER): Payer: Self-pay

## 2023-10-18 ENCOUNTER — Other Ambulatory Visit (HOSPITAL_COMMUNITY): Payer: Self-pay

## 2023-10-21 ENCOUNTER — Encounter (HOSPITAL_COMMUNITY): Payer: Self-pay

## 2023-10-21 ENCOUNTER — Ambulatory Visit (INDEPENDENT_AMBULATORY_CARE_PROVIDER_SITE_OTHER): Payer: MEDICAID

## 2023-10-21 VITALS — BP 134/90 | HR 58 | Ht 70.0 in | Wt 181.0 lb

## 2023-10-21 DIAGNOSIS — F250 Schizoaffective disorder, bipolar type: Secondary | ICD-10-CM

## 2023-10-21 MED ORDER — HALOPERIDOL DECANOATE 100 MG/ML IM SOLN
100.0000 mg | Freq: Once | INTRAMUSCULAR | Status: AC
  Administered 2023-10-21: 100 mg via INTRAMUSCULAR

## 2023-10-21 NOTE — Progress Notes (Cosign Needed)
 Pt tolerated injection of haldol 100mg /ml in right deltoid with no complaints.  Pt denies all AVH, SI, and HI.      JNL, CMA

## 2023-10-23 ENCOUNTER — Other Ambulatory Visit (HOSPITAL_BASED_OUTPATIENT_CLINIC_OR_DEPARTMENT_OTHER): Payer: Self-pay

## 2023-10-25 ENCOUNTER — Telehealth (HOSPITAL_COMMUNITY): Payer: Self-pay

## 2023-10-25 NOTE — Telephone Encounter (Signed)
 Hello ,  Pt called stating he needs a letter from you releasing him to be able to fast for Religious reasons .

## 2023-10-27 NOTE — Telephone Encounter (Signed)
 Pt called again this morning. Told him to give provider some time for response. Asked patient what he letter should entail he didn't give a clear answer.   JNL

## 2023-10-29 ENCOUNTER — Other Ambulatory Visit: Payer: Self-pay

## 2023-10-29 ENCOUNTER — Other Ambulatory Visit (HOSPITAL_BASED_OUTPATIENT_CLINIC_OR_DEPARTMENT_OTHER): Payer: Self-pay

## 2023-10-29 MED ORDER — ATORVASTATIN CALCIUM 40 MG PO TABS
40.0000 mg | ORAL_TABLET | Freq: Every day | ORAL | 6 refills | Status: DC
  Filled 2023-10-29 – 2023-12-14 (×7): qty 30, 30d supply, fill #0

## 2023-10-29 MED ORDER — ATORVASTATIN CALCIUM 40 MG PO TABS
40.0000 mg | ORAL_TABLET | Freq: Every evening | ORAL | 5 refills | Status: DC
  Filled 2023-10-29 – 2023-11-11 (×2): qty 30, 30d supply, fill #0

## 2023-10-30 ENCOUNTER — Other Ambulatory Visit (HOSPITAL_BASED_OUTPATIENT_CLINIC_OR_DEPARTMENT_OTHER): Payer: Self-pay

## 2023-11-01 ENCOUNTER — Other Ambulatory Visit (HOSPITAL_BASED_OUTPATIENT_CLINIC_OR_DEPARTMENT_OTHER): Payer: Self-pay

## 2023-11-01 NOTE — Telephone Encounter (Signed)
 Patient informed this Clinical research associate he already obtained a letter from a different provider.   Dr. Lorna Rose

## 2023-11-02 ENCOUNTER — Other Ambulatory Visit (HOSPITAL_BASED_OUTPATIENT_CLINIC_OR_DEPARTMENT_OTHER): Payer: Self-pay

## 2023-11-05 ENCOUNTER — Other Ambulatory Visit (HOSPITAL_BASED_OUTPATIENT_CLINIC_OR_DEPARTMENT_OTHER): Payer: Self-pay

## 2023-11-08 ENCOUNTER — Other Ambulatory Visit (HOSPITAL_BASED_OUTPATIENT_CLINIC_OR_DEPARTMENT_OTHER): Payer: Self-pay

## 2023-11-08 ENCOUNTER — Other Ambulatory Visit: Payer: Self-pay

## 2023-11-09 ENCOUNTER — Other Ambulatory Visit: Payer: Self-pay

## 2023-11-11 ENCOUNTER — Other Ambulatory Visit: Payer: Self-pay

## 2023-11-11 ENCOUNTER — Other Ambulatory Visit (HOSPITAL_COMMUNITY): Payer: Self-pay

## 2023-11-11 ENCOUNTER — Other Ambulatory Visit (HOSPITAL_BASED_OUTPATIENT_CLINIC_OR_DEPARTMENT_OTHER): Payer: Self-pay

## 2023-11-11 MED ORDER — FAMOTIDINE 20 MG PO TABS
20.0000 mg | ORAL_TABLET | Freq: Every morning | ORAL | 2 refills | Status: DC
  Filled 2023-11-11 – 2023-11-18 (×2): qty 30, 30d supply, fill #0
  Filled 2023-12-04: qty 30, 30d supply, fill #1
  Filled 2023-12-14: qty 30, 30d supply, fill #0
  Filled 2024-01-12: qty 30, 30d supply, fill #1

## 2023-11-18 ENCOUNTER — Other Ambulatory Visit: Payer: Self-pay

## 2023-11-18 ENCOUNTER — Encounter (HOSPITAL_COMMUNITY): Payer: Self-pay

## 2023-11-18 ENCOUNTER — Encounter (HOSPITAL_COMMUNITY): Payer: MEDICAID | Admitting: Student

## 2023-11-18 ENCOUNTER — Other Ambulatory Visit (HOSPITAL_COMMUNITY): Payer: Self-pay

## 2023-11-18 ENCOUNTER — Ambulatory Visit (HOSPITAL_COMMUNITY): Payer: MEDICAID

## 2023-11-18 VITALS — BP 154/86 | HR 59 | Ht 70.0 in | Wt 180.0 lb

## 2023-11-18 DIAGNOSIS — F250 Schizoaffective disorder, bipolar type: Secondary | ICD-10-CM | POA: Diagnosis not present

## 2023-11-18 MED ORDER — HALOPERIDOL DECANOATE 100 MG/ML IM SOLN
100.0000 mg | Freq: Once | INTRAMUSCULAR | Status: AC
  Administered 2023-11-18: 100 mg via INTRAMUSCULAR

## 2023-11-18 NOTE — Progress Notes (Cosign Needed)
 Pt tolerated injection of haldol  100mg /ml in left deltoid with no complaints.  Pt denies all AVH, SI, and HI.

## 2023-11-19 ENCOUNTER — Other Ambulatory Visit (HOSPITAL_COMMUNITY): Payer: Self-pay

## 2023-11-24 ENCOUNTER — Other Ambulatory Visit (HOSPITAL_COMMUNITY): Payer: Self-pay

## 2023-12-04 ENCOUNTER — Other Ambulatory Visit (HOSPITAL_BASED_OUTPATIENT_CLINIC_OR_DEPARTMENT_OTHER): Payer: Self-pay

## 2023-12-10 ENCOUNTER — Other Ambulatory Visit: Payer: Self-pay

## 2023-12-10 ENCOUNTER — Other Ambulatory Visit (HOSPITAL_BASED_OUTPATIENT_CLINIC_OR_DEPARTMENT_OTHER): Payer: Self-pay

## 2023-12-14 ENCOUNTER — Other Ambulatory Visit (HOSPITAL_COMMUNITY): Payer: Self-pay

## 2023-12-14 ENCOUNTER — Other Ambulatory Visit (HOSPITAL_BASED_OUTPATIENT_CLINIC_OR_DEPARTMENT_OTHER): Payer: Self-pay

## 2023-12-16 ENCOUNTER — Ambulatory Visit (HOSPITAL_COMMUNITY): Payer: MEDICAID

## 2023-12-19 ENCOUNTER — Ambulatory Visit (HOSPITAL_COMMUNITY)
Admission: EM | Admit: 2023-12-19 | Discharge: 2023-12-19 | Disposition: A | Discharge status: Home / Self Care | Payer: MEDICAID | Attending: Nurse Practitioner | Admitting: Nurse Practitioner

## 2023-12-19 ENCOUNTER — Encounter (HOSPITAL_COMMUNITY): Payer: Self-pay

## 2023-12-19 DIAGNOSIS — W19XXXA Unspecified fall, initial encounter: Secondary | ICD-10-CM | POA: Diagnosis not present

## 2023-12-19 DIAGNOSIS — S61216A Laceration without foreign body of right little finger without damage to nail, initial encounter: Secondary | ICD-10-CM

## 2023-12-19 DIAGNOSIS — I10 Essential (primary) hypertension: Secondary | ICD-10-CM | POA: Diagnosis not present

## 2023-12-19 DIAGNOSIS — S51001A Unspecified open wound of right elbow, initial encounter: Secondary | ICD-10-CM | POA: Diagnosis not present

## 2023-12-19 DIAGNOSIS — S61212A Laceration without foreign body of right middle finger without damage to nail, initial encounter: Secondary | ICD-10-CM

## 2023-12-19 NOTE — ED Triage Notes (Signed)
 Patient states he forgot his BP meds this AM because he was rushing to get to church.  VS at church were BP-190/101, HR-49, and SPO2-98%   Patient denies dizziness or headache.

## 2023-12-19 NOTE — ED Provider Notes (Signed)
 [facility]    CSN: [id number] Arrival date & time: 12/19/23  1228      History   Chief Complaint Chief Complaint  Patient presents with   Hypertension    HPI Stein Windhorst is a 63 y.o. male.   Keylen Eckenrode is a 63 year old male with a medical history of hypertension, transient ischemic attack, bipolar disorder, and schizophrenia who presents for evaluation of elevated blood pressure and minor injuries sustained from a fall earlier today. The patient reports he was rushing to catch the city bus to attend church and forgot to take his blood pressure medications this morning. While walking to the bus stop, a dog ran toward him in an aggressive manner. Although he was able to avoid being bitten, he fell on uneven concrete in the middle of the street while trying to get away. As a result, he scraped his right elbow and the fingers on his right hand.  After the fall, he was seen by a nurse at his church who cleaned and bandaged the wounds. She also checked his blood pressure twice, with one of the readings noted as 190/101. After consulting with another nurse, he was advised to go to urgent care for further evaluation of his blood pressure. The patient denies any symptoms commonly associated with elevated blood pressure, such as headache, dizziness, blurred vision, chest pain, shortness of breath, or palpitations. He also denies any additional injuries beyond the scrapes on his arm and hand. He is unsure of the exact timing of his last tetanus shot but believes it was within the past five years. He reports that he is scheduled to follow up with both his primary care physician and mental health provider next week for routine care.  The following portions of the patient's history were reviewed and updated as appropriate: allergies, current medications, past family history, past medical history, past social history, past surgical history, and problem list.    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 Authorizing Provider  aspirin  EC (ASPIRIN  81) 81 MG tablet Take 1 tablet (81 mg total) by mouth daily. 03/05/23   Charle Congo, MD  benztropine  (COGENTIN ) 0.5 MG tablet Take 1 tablet (0.5 mg total) by mouth daily. 09/23/23 01/18/24  Shery Done, MD  clopidogrel  (PLAVIX ) 75 MG tablet Take 1 tablet (75 mg total) by mouth daily. 01/06/23     divalproex  (DEPAKOTE  ER) 500 MG 24 hr tablet Take 2 tablets (1,000 mg total) by mouth at bedtime. 09/23/23   Shery Done, MD  famotidine  (PEPCID ) 20 MG tablet Take 1 tablet (20 mg total) by mouth every morning. 11/11/23     haloperidol  decanoate (HALDOL  DECANOATE) 100 MG/ML injection Inject 1 mL (100 mg total) into the muscle every 28 (twenty-eight) days. 05/06/23 04/06/24  Shery Done, MD  hydrochlorothiazide  (MICROZIDE ) 12.5 MG capsule Take 1 capsule (12.5 mg total) by mouth daily. 07/09/23     ibuprofen  (ADVIL ) 800 MG tablet Take 1 tablet (800 mg total) by mouth every 6-8 hours. 10/05/23     losartan  (COZAAR ) 50 MG tablet Take 1 tablet (50 mg total) by mouth daily. 07/08/23   Charle Congo, MD  metoprolol  succinate (TOPROL -XL) 50 MG 24 hr tablet Take 1 tablet (50 mg total) by mouth daily. 09/30/23     candesartan  (ATACAND ) 4 MG tablet Take 1 tablet (4 mg total) by mouth daily. 12/09/17 04/16/19  Ransom Byers, MD     Family History Family History  Problem Relation Age of Onset   Colon cancer Father        ?    Social History Social History   Tobacco Use   Smoking status: Former    Types: Cigarettes   Smokeless tobacco: Never   Tobacco comments:    Quit smoking over 40 years ago -02/06/2022  Vaping Use   Vaping status: Never Used  Substance Use Topics   Alcohol use: No    Alcohol/week: 0.0 standard drinks of alcohol   Drug use: No     Allergies   Patient has no known allergies.   Review of Systems Review of Systems  Eyes:  Negative for visual disturbance.  Respiratory:  Negative for shortness of breath.   Cardiovascular:  Negative for chest pain and palpitations.  Musculoskeletal:  Negative for arthralgias.  Skin:  Positive for wound.  Neurological:  Negative for dizziness and headaches.  All other systems reviewed and are negative.    Physical Exam Triage Vital Signs ED Triage Vitals  Encounter Vitals Group     BP 12/19/23 1321 (!) 177/96     Systolic BP Percentile --      Diastolic BP Percentile --      Pulse Rate 12/19/23 1321 (!) 57     Resp 12/19/23 1321 16     Temp 12/19/23 1321 97.6 F (36.4 C)     Temp Source 12/19/23 1321 Oral     SpO2 12/19/23 1321 97 %     Weight --      Height --      Head Circumference --      Peak Flow --      Pain Score 12/19/23 1320 0     Pain Loc --      Pain Education --      Exclude from Growth Chart --    No data found.  Updated Vital Signs BP (!) 177/96 (BP Location: Left Arm)   Pulse (!) 57   Temp 97.6 F (36.4 C) (Oral)   Resp 16   SpO2 97%   Visual Acuity Right Eye Distance:   Left Eye Distance:   Bilateral Distance:    Right Eye Near:   Left Eye Near:    Bilateral Near:     Physical Exam Vitals reviewed.  Constitutional:      General: He is awake. He is not in acute distress.    Appearance: Normal appearance. He is well-developed and well-groomed. He is not ill-appearing or toxic-appearing.  HENT:      Head: Normocephalic.     Mouth/Throat:     Mouth: Mucous membranes are moist.  Eyes:     General: Vision grossly intact.     Conjunctiva/sclera: Conjunctivae normal.  Cardiovascular:     Rate and Rhythm: Normal rate and regular rhythm.     Heart  sounds: Normal heart sounds.  Pulmonary:     Effort: Pulmonary effort is normal.     Breath sounds: Normal breath sounds and air entry.  Abdominal:     Palpations: Abdomen is soft.  Musculoskeletal:        General: Normal range of motion.     Cervical back: Full passive range of motion without pain, normal range of motion and neck supple.     Right lower leg: No edema.     Left lower leg: No edema.  Skin:    General: Skin is warm and dry.     Findings: Laceration and wound present.     Comments: See images below   Neurological:     General: No focal deficit present.     Mental Status: He is alert and oriented to person, place, and time.     Cranial Nerves: Cranial nerves 2-12 are intact.     Sensory: Sensation is intact. No sensory deficit.     Motor: Motor function is intact. No weakness.     Coordination: Coordination is intact.     Gait: Gait is intact.  Psychiatric:        Attention and Perception: Attention normal.        Mood and Affect: Mood and affect normal.        Speech: Speech normal.        Behavior: Behavior normal. Behavior is cooperative.      Superficial laceration to the distal aspect of the right 5th finger.    Superficial laceration to the distal aspect of the right middle finger.    Skin tear to the posterior elbow. No drainage or bleeding noted.   UC Treatments / Results  Labs (all labs ordered are listed, but only abnormal results are displayed) Labs Reviewed - No data to display  EKG   Radiology No results found.  Procedures Wound Care  Date/Time: 12/19/2023 2:37 PM  Performed by: Maryruth Sol, FNP Authorized by: Maryruth Sol, FNP   Consent:    Consent obtained:  Verbal    Consent given by:  Patient   Risks, benefits, and alternatives were discussed: yes     Risks discussed:  Infection and pain Universal protocol:    Patient identity confirmed:  Verbally with patient and arm band Anesthesia:    Anesthesia method:  None Procedure details:    Indications: open wounds     Wound location:  Arm   Shoulder/arm location:  R elbow   Wound age (days):  <1   Debridement performed: No   Skin layer closed with:    Wound care performed: wound cleansed, antibiotic ointment applied. Dressing:    Dressing applied:  Telfa pad   Wrapped with:  Conform bandage 4 inch Post-procedure details:    Procedure completion:  Tolerated well, no immediate complications Laceration Repair  Date/Time: 12/19/2023 2:39 PM  Performed by: Maryruth Sol, FNP Authorized by: Maryruth Sol, FNP   Consent:    Consent obtained:  Verbal   Consent given by:  Patient   Risks, benefits, and alternatives were discussed: yes     Risks discussed:  Infection, pain and poor cosmetic result Universal protocol:    Patient identity confirmed:  Verbally with patient and arm band Anesthesia:    Anesthesia method:  None Laceration details:    Location:  Finger   Finger location:  R long finger Exploration:    Contaminated: no   Treatment:    Area cleansed with:  Saline  Amount of cleaning:  Standard Skin repair:    Repair method:  Tissue adhesive Approximation:    Approximation:  Close Repair type:    Repair type:  Simple Post-procedure details:    Dressing:  Non-adherent dressing   Procedure completion:  Tolerated well, no immediate complications Laceration Repair  Date/Time: 12/19/2023 2:39 PM  Performed by: Maryruth Sol, FNP Authorized by: Maryruth Sol, FNP   Consent:    Consent obtained:  Verbal   Consent given by:  Patient   Risks, benefits, and alternatives were discussed: yes     Risks discussed:  Infection, pain and poor cosmetic result Universal protocol:     Patient identity confirmed:  Verbally with patient and arm band Anesthesia:    Anesthesia method:  None Laceration details:    Location:  Finger   Finger location:  R small finger Exploration:    Contaminated: no   Treatment:    Area cleansed with:  Saline   Amount of cleaning:  Standard Skin repair:    Repair method:  Tissue adhesive Approximation:    Approximation:  Close Repair type:    Repair type:  Simple Post-procedure details:    Dressing:  Non-adherent dressing   Procedure completion:  Tolerated well, no immediate complications  (including critical care time)  Medications Ordered in UC Medications - No data to display  Initial Impression / Assessment and Plan / UC Course  I have reviewed the triage vital signs and the nursing notes.  Pertinent labs & imaging results that were available during my care of the patient were reviewed by me and considered in my medical decision making (see chart for details).    Patient presents after a fall and reports missing his blood pressure medications this morning while rushing to catch the bus. On exam, he is alert, oriented, and without focal neurologic deficits. Blood pressure is elevated at 177/96 but there are no signs or symptoms of hypertensive urgency or emergency. He has his prescribed antihypertensive medications at home, and medication reconciliation was completed during the visit. He was advised to take his missed doses as soon as he returns home. Wounds to the right elbow and fingers were cleaned and appropriately dressed. The patient is stable for discharge and was instructed to follow up with his primary care and mental health providers next week as already scheduled.  Today's evaluation has revealed no signs of a dangerous process. Discussed diagnosis with patient and/or guardian. Patient and/or guardian aware of their diagnosis, possible red flag symptoms to watch out for and need for close follow up. Patient and/or  guardian understands verbal and written discharge instructions. Patient and/or guardian comfortable with plan and disposition.  Patient and/or guardian has a clear mental status at this time, good insight into illness (after discussion and teaching) and has clear judgment to make decisions regarding their care  Documentation was completed with the aid of voice recognition software. Transcription may contain typographical errors. Final Clinical Impressions(s) / UC Diagnoses   Final diagnoses:  Benign essential HTN  Fall on concrete  Elbow wound, right, initial encounter  Laceration of right middle finger without foreign body without damage to nail, initial encounter  Laceration of right little finger without foreign body without damage to nail, initial encounter     Discharge Instructions      You were seen today after falling and scraping your right elbow and fingers while trying to avoid a dog. Your wounds were cleaned and treated, and no signs of serious  injury were found. You should keep your wounds clean and dry. Watch for any signs of infection, such as redness, swelling, warmth, drainage, or increased pain. If any of these symptoms develop, or if you feel unwell, seek medical attention.  You also reported missing your blood pressure medications this morning, which likely caused your blood pressure to be higher than usual. Your blood pressure was elevated during your visit, but you are not showing any symptoms of a serious blood pressure problem at this time. Please take your blood pressure medications as soon as you get home and continue taking them as prescribed. It's important not to miss any doses.  You already have appointments scheduled with your primary care doctor and your mental health provider next week. Be sure to attend those visits for follow-up care and medication management. If you have any new symptoms or concerns before then, do not hesitate to seek medical care.    ED  Prescriptions   None    PDMP not reviewed this encounter.   Maryruth Sol, [place] 12/19/23 1444

## 2023-12-19 NOTE — Discharge Instructions (Addendum)
 You were seen today after falling and scraping your right elbow and fingers while trying to avoid a dog. Your wounds were cleaned and treated, and no signs of serious injury were found. You should keep your wounds clean and dry. Watch for any signs of infection, such as redness, swelling, warmth, drainage, or increased pain. If any of these symptoms develop, or if you feel unwell, seek medical attention.  You also reported missing your blood pressure medications this morning, which likely caused your blood pressure to be higher than usual. Your blood pressure was elevated during your visit, but you are not showing any symptoms of a serious blood pressure problem at this time. Please take your blood pressure medications as soon as you get home and continue taking them as prescribed. It's important not to miss any doses.  You already have appointments scheduled with your primary care doctor and your mental health provider next week. Be sure to attend those visits for follow-up care and medication management. If you have any new symptoms or concerns before then, do not hesitate to seek medical care.

## 2023-12-22 ENCOUNTER — Ambulatory Visit (HOSPITAL_COMMUNITY)
Admission: EM | Admit: 2023-12-22 | Discharge: 2023-12-22 | Disposition: A | Discharge status: Home / Self Care | Payer: MEDICAID | Attending: Internal Medicine | Admitting: Internal Medicine

## 2023-12-22 ENCOUNTER — Other Ambulatory Visit (HOSPITAL_COMMUNITY): Payer: Self-pay

## 2023-12-22 ENCOUNTER — Ambulatory Visit (HOSPITAL_COMMUNITY): Payer: MEDICAID

## 2023-12-22 ENCOUNTER — Encounter (HOSPITAL_COMMUNITY): Payer: Self-pay | Admitting: Emergency Medicine

## 2023-12-22 ENCOUNTER — Other Ambulatory Visit: Payer: Self-pay

## 2023-12-22 ENCOUNTER — Ambulatory Visit (INDEPENDENT_AMBULATORY_CARE_PROVIDER_SITE_OTHER): Payer: MEDICAID

## 2023-12-22 DIAGNOSIS — M25551 Pain in right hip: Secondary | ICD-10-CM

## 2023-12-22 MED ORDER — IBUPROFEN 800 MG PO TABS
800.0000 mg | ORAL_TABLET | Freq: Three times a day (TID) | ORAL | 0 refills | Status: AC | PRN
  Filled 2023-12-22: qty 15, 5d supply, fill #0

## 2023-12-22 MED ORDER — LIDOCAINE 5 % EX PTCH
1.0000 | MEDICATED_PATCH | CUTANEOUS | 0 refills | Status: AC
  Filled 2023-12-22 – 2023-12-27 (×4): qty 30, 30d supply, fill #0

## 2023-12-22 NOTE — Discharge Instructions (Signed)
 X-ray of the right hip shows no evidence of fractures.  The symptoms are likely muscular in nature.  We can treat with ibuprofen  800 mg every 8 hours as needed for pain.  We can also call in a lidocaine patch that you can apply once every 24 hours to the area that is painful.  Monitor your symptoms and if they are worsening may need to consider follow-up with an orthopedist.  Follow-up at urgent care as needed.

## 2023-12-22 NOTE — ED Provider Notes (Signed)
 [facility]    CSN: [id number] Arrival date & time: 12/22/23  1412      History   Chief Complaint Chief Complaint  Patient presents with   Back Pain    HPI Jerry Carter is a 63 y.o. male.   63 year old male who presents urgent care with complaints of right lateral hip/buttocks pain.  This started yesterday.  He is able to walk on it and the leg is not giving out.  The pain does not radiate.  He denies any history of this in the past.  He did suffer a fall and was seen here on June 1.  He was not having any symptoms at that time.  He denies any bowel or bladder incontinence, weakness in the leg, clicking, locking, numbness or tingling.      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 Authorizing Provider  ibuprofen  (ADVIL ) 800 MG tablet Take 1 tablet (800 mg total) by mouth every 8 (eight) hours as needed for moderate pain (pain score 4-6). 12/22/23  Yes Pattrick Bady A, PA-C  lidocaine (LIDODERM) 5 % Place 1 patch onto the skin daily. Remove & Discard patch within 12 hours or as directed by MD 12/22/23  Yes Kreg Pesa, PA-C  aspirin  EC (ASPIRIN  81) 81 MG tablet Take 1 tablet (81 mg total) by mouth daily. 03/05/23   Charle Congo, MD  benztropine  (COGENTIN ) 0.5 MG  tablet Take 1 tablet (0.5 mg total) by mouth daily. 09/23/23 01/18/24  Shery Done, MD  clopidogrel  (PLAVIX ) 75 MG tablet Take 1 tablet (75 mg total) by mouth daily. 01/06/23     divalproex  (DEPAKOTE  ER) 500 MG 24 hr tablet Take 2 tablets (1,000 mg total) by mouth at bedtime. 09/23/23   Shery Done, MD  famotidine  (PEPCID ) 20 MG tablet Take 1 tablet (20 mg total) by mouth every morning. 11/11/23     haloperidol  decanoate (HALDOL  DECANOATE) 100 MG/ML injection Inject 1 mL (100 mg total) into the muscle every 28 (twenty-eight) days. 05/06/23 04/06/24  Shery Done, MD  hydrochlorothiazide  (MICROZIDE ) 12.5 MG capsule Take 1 capsule (12.5 mg total) by mouth daily. 07/09/23     losartan  (COZAAR ) 50 MG tablet Take 1 tablet (50 mg total) by mouth daily. 07/08/23   Charle Congo, MD  metoprolol  succinate (TOPROL -XL) 50 MG 24 hr tablet Take 1 tablet (50 mg total) by mouth daily. 09/30/23     candesartan  (ATACAND ) 4 MG tablet Take 1 tablet (4 mg total) by mouth daily. 12/09/17 04/16/19  Ransom Byers, MD    Family History Family History  Problem Relation Age of Onset   Colon cancer Father        ?  Social History Social History   Tobacco Use   Smoking status: Former    Types: Cigarettes   Smokeless tobacco: Never   Tobacco comments:    Quit smoking over 40 years ago -02/06/2022  Vaping Use   Vaping status: Never Used  Substance Use Topics   Alcohol use: No    Alcohol/week: 0.0 standard drinks of alcohol   Drug use: No     Allergies   Patient has no known allergies.   Review of Systems Review of Systems  Constitutional:  Negative for chills and fever.  HENT:  Negative for ear pain and sore throat.   Eyes:  Negative for pain and visual disturbance.  Respiratory:  Negative for cough and shortness of breath.   Cardiovascular:  Negative for chest pain and palpitations.  Gastrointestinal:  Negative for abdominal pain and vomiting.  Genitourinary:  Negative for dysuria and  hematuria.  Musculoskeletal:  Negative for arthralgias and back pain.       Right lateral hip pain  Skin:  Negative for color change and rash.  Neurological:  Negative for seizures and syncope.  All other systems reviewed and are negative.    Physical Exam Triage Vital Signs ED Triage Vitals  Encounter Vitals Group     BP 12/22/23 1522 (!) 143/87     Systolic BP Percentile --      Diastolic BP Percentile --      Pulse Rate 12/22/23 1522 (!) 52     Resp 12/22/23 1522 20     Temp 12/22/23 1522 97.7 F (36.5 C)     Temp Source 12/22/23 1522 Oral     SpO2 12/22/23 1522 97 %     Weight --      Height --      Head Circumference --      Peak Flow --      Pain Score 12/22/23 1518 5     Pain Loc --      Pain Education --      Exclude from Growth Chart --    No data found.  Updated Vital Signs BP (!) 143/87 (BP Location: Right Arm)   Pulse (!) 52   Temp 97.7 F (36.5 C) (Oral)   Resp 20   SpO2 97%   Visual Acuity Right Eye Distance:   Left Eye Distance:   Bilateral Distance:    Right Eye Near:   Left Eye Near:    Bilateral Near:     Physical Exam Vitals and nursing note reviewed.  Constitutional:      General: He is not in acute distress.    Appearance: He is well-developed.  HENT:     Head: Normocephalic and atraumatic.  Eyes:     Conjunctiva/sclera: Conjunctivae normal.  Cardiovascular:     Rate and Rhythm: Normal rate and regular rhythm.     Heart sounds: No murmur heard. Pulmonary:     Effort: Pulmonary effort is normal. No respiratory distress.     Breath sounds: Normal breath sounds.  Abdominal:     Palpations: Abdomen is soft.     Tenderness: There is no abdominal tenderness.  Musculoskeletal:        General: No swelling.     Cervical back: Neck supple.     Right hip: Tenderness (lateral hip along iliac crest) present. No deformity or bony tenderness. Normal range of motion. Normal strength.     Left hip: Normal. No tenderness. Normal range of  motion.  Right upper leg: Normal.     Right foot: Normal pulse.     Left foot: Normal pulse.  Skin:    General: Skin is warm and dry.     Capillary Refill: Capillary refill takes less than 2 seconds.  Neurological:     Mental Status: He is alert.  Psychiatric:        Mood and Affect: Mood normal.      UC Treatments / Results  Labs (all labs ordered are listed, but only abnormal results are displayed) Labs Reviewed - No data to display  EKG   Radiology DG Hip Unilat With Pelvis 2-3 Views Right Result Date: 12/22/2023 CLINICAL DATA:  Acute right flank pain. EXAM: DG HIP (WITH OR WITHOUT PELVIS) 2-3V RIGHT COMPARISON:  September 11, 2016. FINDINGS: There is no evidence of hip fracture or dislocation. There is no evidence of arthropathy or other focal bone abnormality. IMPRESSION: Negative. Electronically Signed   By: Rosalene Colon M.D.   On: 12/22/2023 16:20    Procedures Procedures (including critical care time)  Medications Ordered in UC Medications - No data to display  Initial Impression / Assessment and Plan / UC Course  I have reviewed the triage vital signs and the nursing notes.  Pertinent labs & imaging results that were available during my care of the patient were reviewed by me and considered in my medical decision making (see chart for details).     Right hip pain - Plan: DG Hip Unilat With Pelvis 2-3 Views Right, DG Hip Unilat With Pelvis 2-3 Views Right  X-ray done of the right hip is negative for any acute changes.  Symptoms are likely muscular in nature.  Reassuringly, the patient is able to ambulate and has full range of motion of the hip.  There is no neurological symptoms.  We will prescribe the patient ibuprofen  800 mg every 8 hours as needed for moderate to severe pain.  We will also give him lidocaine 5% patches to apply directly to the area every 24 hours as needed for pain.  Advised the patient that if his symptoms persist or worsen then he may come  back to urgent care or he may need to follow-up with a orthopedist  Final Clinical Impressions(s) / UC Diagnoses   Final diagnoses:  Right hip pain     Discharge Instructions      X-ray of the right hip shows no evidence of fractures.  The symptoms are likely muscular in nature.  We can treat with ibuprofen  800 mg every 8 hours as needed for pain.  We can also call in a lidocaine patch that you can apply once every 24 hours to the area that is painful.  Monitor your symptoms and if they are worsening may need to consider follow-up with an orthopedist.  Follow-up at urgent care as needed.   ED Prescriptions     Medication Sig Dispense Auth. Provider   ibuprofen  (ADVIL ) 800 MG tablet Take 1 tablet (800 mg total) by mouth every 8 (eight) hours as needed for moderate pain (pain score 4-6). 15 tablet Shireen Rayburn A, PA-C   lidocaine (LIDODERM) 5 % Place 1 patch onto the skin daily. Remove & Discard patch within 12 hours or as directed by MD 30 patch Romaldo Saville, Marjory Signs, PA-C      PDMP not reviewed this encounter.   Kreg Pesa, PA-C 12/22/23 1630

## 2023-12-22 NOTE — ED Triage Notes (Signed)
 Complains of right flank and lower back pain that was noticed late last night .  Patient reports he has not felt this pain before now.  Denies urinating. Denies falls, no known injury.  Patient was seen 12/19/2023 for headache and dizziness

## 2023-12-23 ENCOUNTER — Ambulatory Visit (INDEPENDENT_AMBULATORY_CARE_PROVIDER_SITE_OTHER): Payer: MEDICAID | Admitting: Student

## 2023-12-23 ENCOUNTER — Ambulatory Visit (HOSPITAL_COMMUNITY): Payer: MEDICAID

## 2023-12-23 ENCOUNTER — Other Ambulatory Visit (HOSPITAL_COMMUNITY): Payer: Self-pay

## 2023-12-23 ENCOUNTER — Other Ambulatory Visit (HOSPITAL_BASED_OUTPATIENT_CLINIC_OR_DEPARTMENT_OTHER): Payer: Self-pay

## 2023-12-23 VITALS — BP 169/97 | HR 70 | Temp 97.9°F | Ht 70.0 in | Wt 180.0 lb

## 2023-12-23 DIAGNOSIS — F250 Schizoaffective disorder, bipolar type: Secondary | ICD-10-CM

## 2023-12-23 MED ORDER — HALOPERIDOL DECANOATE 100 MG/ML IM SOLN
100.0000 mg | Freq: Once | INTRAMUSCULAR | Status: AC
  Administered 2023-12-23: 100 mg via INTRAMUSCULAR

## 2023-12-23 MED ORDER — LOSARTAN POTASSIUM-HCTZ 100-12.5 MG PO TABS
1.0000 | ORAL_TABLET | Freq: Every day | ORAL | 5 refills | Status: DC
  Filled 2023-12-23 – 2023-12-24 (×4): qty 30, 30d supply, fill #0
  Filled 2024-01-12 (×2): qty 30, 30d supply, fill #1
  Filled 2024-02-10: qty 30, 30d supply, fill #2
  Filled 2024-03-13: qty 30, 30d supply, fill #3
  Filled 2024-04-10: qty 30, 30d supply, fill #4
  Filled 2024-05-10: qty 30, 30d supply, fill #5

## 2023-12-23 NOTE — Progress Notes (Signed)
 [facility] MD/PA/NP OP Progress Note  12/23/2023 4:36 PM  Jerry Carter  MRN:  [id number]  Chief Complaint:  Medication Management   HPI: Jerry Carter is a 63 year old male with a psychiatric history of schizoaffective disorder bipolar type and neuroleptic-induced tardive dyskinesia who is seen today for follow up psychiatric evaluation.  He is managed on Haldol  Decanoate 100 mg q 28, which he notes is effective in managing his psychiatric conditions.   Since last visit, patient reports good mood, sleeping well, and his appetite is intact. He denies AVH, SI, HI, paranoia. Denies TD sx, and objectively, none are apparent.  Aims equals 0.  Of note, patient has not been on depakote  or ingrezza  for some time now. He showed me his prescription medications and it only included Cogentin , aspirin , as needed ibuprofen , metoprolol , and famotidine .  Given he appears to have been noncompliant with Depakote  for some time now, we will evaluate if patient needs Depakote  to maintain stability or not.  No acute safety concerns were identified today.  Patient will be following up for medication management with Dr. Chien.   Patient denies tobacco product, alcohol, and illicit drug use.  Denies  manic symptoms since last visit.  He is tolerating his medications well without adverse effects and is also compliant with his LAI.     Visit Diagnosis:    ICD-10-CM   1. Schizoaffective disorder, bipolar type (HCC) [F25.0]  F25.0             Past Psychiatric History: schizoaffective disorder and Neuroleptic-induced tardive dyskinesia   Past Medical History:  Past Medical History:  Diagnosis Date   Anxiety    Bipolar disorder (HCC)    HLD (hyperlipidemia)    HTN (hypertension)    Hypertension    Insomnia    Schizophrenia (HCC)    TIA (transient ischemic attack)     Past Surgical History:  Procedure Laterality Date   NO PAST SURGERIES      Family Psychiatric History: None reported  Family History:  Family  History  Problem Relation Age of Onset   Colon cancer Father        ?    Social History:  Social History   Socioeconomic History   Marital status: Single    Spouse name: Not on file   Number of children: 0   Years of education: Not on file   Highest education level: Not on file  Occupational History   Not on file  Tobacco Use   Smoking status: Former    Types: Cigarettes   Smokeless tobacco: Never   Tobacco comments:    Quit smoking over 40 years ago -02/06/2022  Vaping Use   Vaping status: Never Used  Substance and Sexual Activity   Alcohol use: No    Alcohol/week: 0.0 standard drinks of alcohol   Drug use: No   Sexual activity: Not Currently  Other Topics Concern   Not on file  Social History Narrative   Not on file   Social Drivers of Health   Financial Resource Strain: Not on file  Food Insecurity: Low Risk  (03/31/2023)   Received from [facility]   Food Insecurity    Within the past 12 months, did the food you bought just not last and you didn't have money to get more?: No    Within the past 12 months, did you worry that your food would run out before you got money to buy more?: No  Transportation Needs:  Low Risk  (03/31/2023)   Received from [facility]   Transportation Needs    Within the past 12 months, has a lack of transportation kept you from medical appointments or from doing things needed for daily living?: No  Physical Activity: Not on file  Stress: Not on file  Social Connections: Not on file    Allergies: No Known Allergies  Metabolic Disorder Labs: Lab Results  Component Value Date   HGBA1C 6.0 (H) 12/21/2022   MPG 126 12/21/2022   MPG 119.76 12/02/2022   Lab Results  Component Value Date   PROLACTIN 20.9 (H) 04/30/2022   Lab Results  Component Value Date   CHOL 96 02/19/2023   TRIG 30 02/19/2023   HDL 40 02/19/2023   CHOLHDL 2.4 02/19/2023   VLDL 6 12/21/2022   LDLCALC 47 02/19/2023   LDLCALC 66  12/21/2022   Lab Results  Component Value Date   TSH 1.69 02/19/2023   TSH 1.893 12/21/2022    Therapeutic Level Labs: Lab Results  Component Value Date   LITHIUM  0.53 (L) 03/03/2014   Lab Results  Component Value Date   VALPROATE 113.4 (H) 02/22/2014   No results found for: "CBMZ"  Current Medications: Current Outpatient Medications  Medication Sig Dispense Refill   aspirin  EC (ASPIRIN  81) 81 MG tablet Take 1 tablet (81 mg total) by mouth daily. 30 tablet 5   benztropine  (COGENTIN ) 0.5 MG tablet Take 1 tablet (0.5 mg total) by mouth daily. 30 tablet 1   clopidogrel  (PLAVIX ) 75 MG tablet Take 1 tablet (75 mg total) by mouth daily. 30 tablet 1   divalproex  (DEPAKOTE  ER) 500 MG 24 hr tablet Take 2 tablets (1,000 mg total) by mouth at bedtime. 60 tablet 1   famotidine  (PEPCID ) 20 MG tablet Take 1 tablet (20 mg total) by mouth every morning. 30 tablet 2   haloperidol  decanoate (HALDOL  DECANOATE) 100 MG/ML injection Inject 1 mL (100 mg total) into the muscle every 28 (twenty-eight) days. 1 mL 11   hydrochlorothiazide  (MICROZIDE ) 12.5 MG capsule Take 1 capsule (12.5 mg total) by mouth daily. 90 capsule 2   ibuprofen  (ADVIL ) 800 MG tablet Take 1 tablet (800 mg total) by mouth every 8 (eight) hours as needed for moderate pain (pain score 4-6). 15 tablet 0   lidocaine (LIDODERM) 5 % Place 1 patch onto the skin daily. Remove & Discard patch within 12 hours or as directed by MD 30 patch 0   losartan  (COZAAR ) 50 MG tablet Take 1 tablet (50 mg total) by mouth daily. 30 tablet 5   metoprolol  succinate (TOPROL -XL) 50 MG 24 hr tablet Take 1 tablet (50 mg total) by mouth daily. 30 tablet 5   No current facility-administered medications for this visit.     Musculoskeletal: Strength & Muscle Tone: within normal limits Gait & Station: normal Patient leans: N/A  Psychiatric Specialty Exam: Review of Systems  Constitutional:  Negative for activity change and unexpected weight change.        Denies akathisia  Cardiovascular:  Negative for chest pain.  Gastrointestinal: Negative.   Neurological: Negative.  Negative for dizziness, seizures and headaches.    There were no vitals taken for this visit.There is no height or weight on file to calculate BMI.  See nursing note from LAI visit for vitals  General Appearance: Well Groomed and but somewhat bizarre with a small, green checkered hat  Eye Contact:  Good  Speech:  Clear and Coherent and Normal Rate  Volume:  Normal  Mood:  Euthymic  Affect:  Congruent  Thought Process:  Coherent, Goal Directed, and Linear  Orientation:  Full (Time, Place, and Person)  Thought Content: WDL and Logical   Suicidal Thoughts:  No  Homicidal Thoughts:  No  Memory:  Immediate;   Good Recent;   Good Remote;   Good  Judgement:  Good  Insight:  Fair, improved  Psychomotor Activity:  Normal  Concentration:  Concentration: Good and Attention Span: Good  Recall:  Good  Fund of Knowledge: Good  Language: Good  Akathisia:  No  Handed:  Right  AIMS (if indicated): 0  Assets:  Communication Skills Desire for Improvement Financial Resources/Insurance Housing Physical Health Social Support  ADL's:  Intact  Cognition: WNL  Sleep:  Good   Screenings: AIMS    Flowsheet Row ED from 03/17/2023 in [facility] at [facility] Office Visit from 07/23/2022 in [facility] Office Visit from 02/06/2022 in [facility]  AIMS Total Score 0 0 6      AUDIT    Flowsheet Row [facility]  Alcohol Use Disorder Identification Test Final Score (AUDIT) 0      GAD-7    Flowsheet Row Office Visit from 04/30/2022 in [facility]  Total GAD-7 Score 0      PHQ2-9    Flowsheet Row Office Visit from 01/28/2023 in [facility] Office Visit from  04/30/2022 in [facility] Office Visit from 12/09/2017 in [facility] [place]. [facility] Office Visit from 04/16/2017 in [facility] - [facility] Dept Of [facility]. [facility] Office Visit from 03/04/2017 in [facility] - [facility] Of [place]. [facility]  PHQ-2 Total Score 0 0 0 0 0      Flowsheet Row UC from 12/22/2023 in [facility] at [facility] UC from 12/19/2023 in [facility] at [facility] ED from 03/17/2023 in [facility] Emergency Department at [facility]  C-SSRS RISK CATEGORY No Risk No Risk No Risk        Assessment and Plan: Patient reports that his anxiety, depression, and psychosis are well managed and again denies presence today. He appears calm and euthymic, and remains able to rationalize and differentiate good from bad decisions.  No identifiable hyperverbal speech or hyperreligiosity.  He does not appear to have any of the manic symptoms that he had previously displayed.  Will evaluate for need of Depakote  oral given patient's medication nonadherence.  Low threshold to restart a mood stabilizer but there is concern whether patient will be compliant with medications as that he appears to have a history of noncompliance.  Patient has had significantly elevated blood pressure for several months now and does have a history of TIA.  Patient was strongly advised to see PCP and he reports having an appointment with Charle Congo, MD at 4 PM today. He will also be requesting they obtain metabolic labs A1c and lipid panel as well as ECG.  1. Schizoaffective disorder, bipolar type (HCC)  - Continue Haldol  Decanoate 100 mg/ml q28 days -Hold Ingrezza  as patient is nonadherent.  Can restart if tardive dyskinesia symptoms resume -Hold Depakote  as patient appears to be nonadherent to this and there is no indication and dispense report  that he has never picked up  Depakote  Last therapeutic level at 64 on 9/10. -Continue Cogentin  0.5 mg daily, as patient takes Haldol , to prevent EPS; scheduling daily dose as patient with difficulty determining when to take medications as needed.  He had continued to take scheduled.  Collaboration of Care: Collaboration of Care: Other provider involved in patient's care AEB PCP and shot clinic staff  Patient/Guardian was advised Release of Information must be obtained prior to any record release in order to collaborate their care with an outside provider. Patient/Guardian was advised if they have not already done so to contact the registration department to sign all necessary forms in order for us  to release information regarding their care.   Consent: Patient/Guardian gives verbal consent for treatment and assignment of benefits for services provided during this visit. Patient/Guardian expressed understanding and agreed to proceed.    Augusta Blizzard, MD 12/23/2023 4:36 PM

## 2023-12-23 NOTE — Progress Notes (Addendum)
 Pt presents today for injection of Haldol  32ml/ 100 mg. Which was tolerated successfully in patients Right-deltoid His BP was abnormally high first being 190/100 second reading being: 169/97   Pt denies all AVH, SI, and HI. Pt states that the medication does work for him. Did strongly encourage him to follow up or at least try to book a PCP, to monitor his outside symptoms. Pt states that he will be going today, or in nearby future: as this has been recommended to him for some time now.    JNL, CMA

## 2023-12-24 ENCOUNTER — Other Ambulatory Visit (HOSPITAL_BASED_OUTPATIENT_CLINIC_OR_DEPARTMENT_OTHER): Payer: Self-pay

## 2023-12-24 ENCOUNTER — Other Ambulatory Visit: Payer: Self-pay

## 2023-12-24 ENCOUNTER — Other Ambulatory Visit (HOSPITAL_COMMUNITY): Payer: Self-pay

## 2023-12-24 NOTE — Addendum Note (Signed)
 Addended by: Ulysess Gang A on: 12/24/2023 02:21 PM   Modules accepted: Level of Service

## 2023-12-25 ENCOUNTER — Other Ambulatory Visit (HOSPITAL_BASED_OUTPATIENT_CLINIC_OR_DEPARTMENT_OTHER): Payer: Self-pay

## 2023-12-27 ENCOUNTER — Other Ambulatory Visit (HOSPITAL_BASED_OUTPATIENT_CLINIC_OR_DEPARTMENT_OTHER): Payer: Self-pay

## 2024-01-06 ENCOUNTER — Other Ambulatory Visit (HOSPITAL_BASED_OUTPATIENT_CLINIC_OR_DEPARTMENT_OTHER): Payer: Self-pay

## 2024-01-12 ENCOUNTER — Other Ambulatory Visit (HOSPITAL_COMMUNITY): Payer: Self-pay

## 2024-01-12 ENCOUNTER — Other Ambulatory Visit: Payer: Self-pay

## 2024-01-14 ENCOUNTER — Other Ambulatory Visit (HOSPITAL_COMMUNITY): Payer: Self-pay

## 2024-01-20 ENCOUNTER — Other Ambulatory Visit (HOSPITAL_BASED_OUTPATIENT_CLINIC_OR_DEPARTMENT_OTHER): Payer: Self-pay

## 2024-01-20 ENCOUNTER — Encounter (HOSPITAL_COMMUNITY): Payer: Self-pay

## 2024-01-20 ENCOUNTER — Other Ambulatory Visit (HOSPITAL_COMMUNITY): Payer: Self-pay

## 2024-01-20 ENCOUNTER — Other Ambulatory Visit: Payer: Self-pay

## 2024-01-20 ENCOUNTER — Ambulatory Visit (HOSPITAL_COMMUNITY): Payer: MEDICAID | Admitting: *Deleted

## 2024-01-20 ENCOUNTER — Telehealth (HOSPITAL_COMMUNITY): Payer: Self-pay | Admitting: Student

## 2024-01-20 VITALS — BP 136/85 | HR 91 | Wt 162.0 lb

## 2024-01-20 DIAGNOSIS — F250 Schizoaffective disorder, bipolar type: Secondary | ICD-10-CM

## 2024-01-20 DIAGNOSIS — Z79899 Other long term (current) drug therapy: Secondary | ICD-10-CM

## 2024-01-20 MED ORDER — BENZTROPINE MESYLATE 0.5 MG PO TABS
0.5000 mg | ORAL_TABLET | Freq: Every day | ORAL | 1 refills | Status: DC
  Filled 2024-01-20 (×2): qty 30, 30d supply, fill #0
  Filled 2024-02-10 – 2024-02-11 (×2): qty 30, 30d supply, fill #1

## 2024-01-20 MED ORDER — HALOPERIDOL DECANOATE 100 MG/ML IM SOLN
100.0000 mg | INTRAMUSCULAR | Status: AC
  Administered 2024-01-20: 100 mg via INTRAMUSCULAR

## 2024-01-20 NOTE — Telephone Encounter (Signed)
 Sent!

## 2024-01-20 NOTE — Progress Notes (Signed)
 In as scheduled for his monthly inj of Haldo D 100 mg. Today he got his shot in his L Deltoid without difficulty. He reports he is doing well, working at [employer] his house and is living in a boarding house. 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 He is calm and appropriate. States he speaks to his friend Mr Venetia nearly every day. He asked me to review his meds and he is out of Cogentin  and has been for months. He complains of a biting sensation he needs it for. I will forward this request to Dr Lynnette who was the last dr to see him. He has a future appt with Dr Chester in Aug.He is to return for his next shot in 28 days.

## 2024-02-02 ENCOUNTER — Other Ambulatory Visit (HOSPITAL_COMMUNITY): Payer: Self-pay

## 2024-02-10 ENCOUNTER — Other Ambulatory Visit (HOSPITAL_BASED_OUTPATIENT_CLINIC_OR_DEPARTMENT_OTHER): Payer: Self-pay

## 2024-02-10 ENCOUNTER — Other Ambulatory Visit (HOSPITAL_COMMUNITY): Payer: Self-pay

## 2024-02-10 ENCOUNTER — Other Ambulatory Visit: Payer: Self-pay

## 2024-02-10 MED ORDER — FAMOTIDINE 20 MG PO TABS
20.0000 mg | ORAL_TABLET | Freq: Every morning | ORAL | 2 refills | Status: DC
  Filled 2024-03-13: qty 30, 30d supply, fill #0
  Filled 2024-04-10: qty 30, 30d supply, fill #1
  Filled 2024-05-10: qty 30, 30d supply, fill #0

## 2024-02-17 ENCOUNTER — Encounter (HOSPITAL_COMMUNITY): Payer: Self-pay

## 2024-02-17 ENCOUNTER — Ambulatory Visit (INDEPENDENT_AMBULATORY_CARE_PROVIDER_SITE_OTHER): Payer: MEDICAID | Admitting: *Deleted

## 2024-02-17 VITALS — BP 117/69 | HR 53 | Resp 14 | Ht 70.0 in | Wt 171.4 lb

## 2024-02-17 DIAGNOSIS — F250 Schizoaffective disorder, bipolar type: Secondary | ICD-10-CM | POA: Diagnosis not present

## 2024-02-17 MED ORDER — HALOPERIDOL DECANOATE 100 MG/ML IM SOLN
100.0000 mg | Freq: Once | INTRAMUSCULAR | Status: AC
  Administered 2024-02-17: 100 mg via INTRAMUSCULAR

## 2024-02-17 NOTE — Progress Notes (Cosign Needed)
 Patient arrived for his injection of Haldol  100mg  that was provided by his pharmacy. States that everything is going well. 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 Denies SI/HI or AVH. Injection prepared as ordered and given in Right Deltoid without issue or complaint. Will return in 28 days.
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
## 2024-02-22 ENCOUNTER — Other Ambulatory Visit (HOSPITAL_COMMUNITY): Payer: Self-pay

## 2024-02-22 ENCOUNTER — Ambulatory Visit (INDEPENDENT_AMBULATORY_CARE_PROVIDER_SITE_OTHER): Payer: MEDICAID | Admitting: Psychiatry

## 2024-02-22 DIAGNOSIS — F250 Schizoaffective disorder, bipolar type: Secondary | ICD-10-CM | POA: Diagnosis not present

## 2024-02-22 DIAGNOSIS — Z79899 Other long term (current) drug therapy: Secondary | ICD-10-CM | POA: Diagnosis not present

## 2024-02-22 MED ORDER — HALOPERIDOL DECANOATE 100 MG/ML IM SOLN: 100.0000 mg | INTRAMUSCULAR | 11 refills | Status: AC

## 2024-02-22 MED ORDER — BENZTROPINE MESYLATE 0.5 MG PO TABS
0.5000 mg | ORAL_TABLET | Freq: Every day | ORAL | 1 refills | Status: DC
  Filled 2024-02-22 – 2024-03-13 (×2): qty 30, 30d supply, fill #0

## 2024-02-28 ENCOUNTER — Other Ambulatory Visit (HOSPITAL_COMMUNITY): Payer: MEDICAID

## 2024-03-13 ENCOUNTER — Other Ambulatory Visit: Payer: Self-pay

## 2024-03-13 ENCOUNTER — Other Ambulatory Visit (HOSPITAL_COMMUNITY): Payer: Self-pay

## 2024-03-14 ENCOUNTER — Other Ambulatory Visit (HOSPITAL_COMMUNITY): Payer: Self-pay

## 2024-03-14 ENCOUNTER — Other Ambulatory Visit: Payer: Self-pay

## 2024-03-14 MED ORDER — ASPIRIN 81 MG PO TBEC
81.0000 mg | DELAYED_RELEASE_TABLET | Freq: Every day | ORAL | 5 refills | Status: AC
  Filled 2024-03-14 (×2): qty 30, 30d supply, fill #0
  Filled 2024-04-10: qty 30, 30d supply, fill #1
  Filled 2024-05-10: qty 30, 30d supply, fill #0
  Filled 2024-06-07 – 2024-06-19 (×2): qty 30, 30d supply, fill #1
  Filled 2024-07-18: qty 30, 30d supply, fill #0

## 2024-03-16 ENCOUNTER — Encounter (HOSPITAL_COMMUNITY): Payer: Self-pay

## 2024-03-16 ENCOUNTER — Ambulatory Visit (INDEPENDENT_AMBULATORY_CARE_PROVIDER_SITE_OTHER): Payer: MEDICAID

## 2024-03-16 VITALS — BP 114/70 | HR 60 | Wt 171.2 lb

## 2024-03-16 DIAGNOSIS — F411 Generalized anxiety disorder: Secondary | ICD-10-CM

## 2024-03-16 DIAGNOSIS — F250 Schizoaffective disorder, bipolar type: Secondary | ICD-10-CM | POA: Diagnosis not present

## 2024-03-16 MED ORDER — HALOPERIDOL DECANOATE 100 MG/ML IM SOLN
100.0000 mg | Freq: Once | INTRAMUSCULAR | Status: AC
  Administered 2024-03-16: 100 mg via INTRAMUSCULAR

## 2024-03-16 NOTE — Progress Notes (Cosign Needed)
 Pt tolerated injection of haldol  100mg /ml in left deltoid with no complaints.  Pt denies all AVH, SI, and HI. Pt states that medicine is doing good for him overall.   JNL
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
## 2024-04-06 ENCOUNTER — Other Ambulatory Visit: Payer: Self-pay

## 2024-04-06 ENCOUNTER — Other Ambulatory Visit (HOSPITAL_BASED_OUTPATIENT_CLINIC_OR_DEPARTMENT_OTHER): Payer: Self-pay

## 2024-04-06 ENCOUNTER — Ambulatory Visit (INDEPENDENT_AMBULATORY_CARE_PROVIDER_SITE_OTHER): Payer: MEDICAID | Admitting: Psychiatry

## 2024-04-06 DIAGNOSIS — F250 Schizoaffective disorder, bipolar type: Secondary | ICD-10-CM

## 2024-04-06 DIAGNOSIS — Z79899 Other long term (current) drug therapy: Secondary | ICD-10-CM | POA: Diagnosis not present

## 2024-04-06 MED ORDER — BENZTROPINE MESYLATE 0.5 MG PO TABS
0.5000 mg | ORAL_TABLET | Freq: Every day | ORAL | 2 refills | Status: DC
  Filled 2024-04-06 – 2024-05-10 (×3): qty 30, 30d supply, fill #0
  Filled 2024-06-07 – 2024-06-19 (×2): qty 30, 30d supply, fill #1

## 2024-04-06 MED ORDER — METOPROLOL SUCCINATE ER 50 MG PO TB24
50.0000 mg | ORAL_TABLET | Freq: Every day | ORAL | 5 refills | Status: AC
  Filled 2024-04-10 – 2024-05-10 (×2): qty 30, 30d supply, fill #0
  Filled 2024-06-07 – 2024-06-19 (×2): qty 30, 30d supply, fill #1
  Filled 2024-07-18: qty 30, 30d supply, fill #0
  Filled 2024-08-14: qty 30, 30d supply, fill #1

## 2024-04-10 ENCOUNTER — Other Ambulatory Visit (HOSPITAL_COMMUNITY): Payer: Self-pay

## 2024-04-10 ENCOUNTER — Other Ambulatory Visit: Payer: Self-pay

## 2024-04-10 ENCOUNTER — Other Ambulatory Visit (HOSPITAL_BASED_OUTPATIENT_CLINIC_OR_DEPARTMENT_OTHER): Payer: Self-pay

## 2024-04-13 ENCOUNTER — Other Ambulatory Visit (HOSPITAL_COMMUNITY): Payer: Self-pay | Admitting: Psychiatry

## 2024-04-13 ENCOUNTER — Other Ambulatory Visit (HOSPITAL_COMMUNITY)

## 2024-04-13 ENCOUNTER — Ambulatory Visit (INDEPENDENT_AMBULATORY_CARE_PROVIDER_SITE_OTHER)

## 2024-04-13 ENCOUNTER — Encounter (HOSPITAL_COMMUNITY): Payer: Self-pay

## 2024-04-13 DIAGNOSIS — F200 Paranoid schizophrenia: Secondary | ICD-10-CM

## 2024-04-13 DIAGNOSIS — R112 Nausea with vomiting, unspecified: Secondary | ICD-10-CM

## 2024-04-13 DIAGNOSIS — R197 Diarrhea, unspecified: Secondary | ICD-10-CM

## 2024-04-13 DIAGNOSIS — F250 Schizoaffective disorder, bipolar type: Secondary | ICD-10-CM

## 2024-04-13 DIAGNOSIS — Z79899 Other long term (current) drug therapy: Secondary | ICD-10-CM

## 2024-04-13 MED ORDER — HALOPERIDOL DECANOATE 100 MG/ML IM SOLN
100.0000 mg | Freq: Once | INTRAMUSCULAR | Status: AC
  Administered 2024-04-13: 100 mg via INTRAMUSCULAR

## 2024-04-13 NOTE — Progress Notes (Signed)
 Patient tolerated labs with no complaints, pt left Ambulatory and alert .

## 2024-04-13 NOTE — Progress Notes (Signed)
 Pt tolerated injection of haldol  100mg /ml in Right deltoid with no complaints.  Pt denies all AVH, SI, and HI. Pt states that medicine is doing good for him overall.

## 2024-04-18 LAB — LIPID PANEL W/O CHOL/HDL RATIO
Cholesterol, Total: 131 mg/dL
HDL: 23 mg/dL — ABNORMAL LOW (ref >39)
LDL Chol Calc (NIH): 75 mg/dL
Triglycerides: 193 mg/dL
VLDL Cholesterol Cal: 33 mg/dL (ref 5–40)

## 2024-04-18 LAB — HGB A1C W/O EAG: Hgb A1c MFr Bld: 5.9 % — ABNORMAL HIGH (ref 4.8–5.6)

## 2024-04-18 LAB — SPECIMEN STATUS REPORT

## 2024-04-24 ENCOUNTER — Encounter (HOSPITAL_COMMUNITY): Payer: Self-pay | Admitting: Psychiatry

## 2024-05-03 ENCOUNTER — Other Ambulatory Visit (HOSPITAL_COMMUNITY)

## 2024-05-10 ENCOUNTER — Other Ambulatory Visit (HOSPITAL_COMMUNITY): Payer: Self-pay

## 2024-05-11 ENCOUNTER — Ambulatory Visit (INDEPENDENT_AMBULATORY_CARE_PROVIDER_SITE_OTHER): Admitting: *Deleted

## 2024-05-11 ENCOUNTER — Encounter (HOSPITAL_COMMUNITY): Payer: Self-pay

## 2024-05-11 VITALS — BP 129/85 | HR 66 | Resp 18 | Ht 70.0 in | Wt 171.0 lb

## 2024-05-11 DIAGNOSIS — F200 Paranoid schizophrenia: Secondary | ICD-10-CM

## 2024-05-11 MED ORDER — HALOPERIDOL DECANOATE 100 MG/ML IM SOLN
100.0000 mg | Freq: Once | INTRAMUSCULAR | Status: AC
  Administered 2024-05-11: 100 mg via INTRAMUSCULAR

## 2024-05-11 NOTE — Progress Notes (Cosign Needed)
 Patient arrived for his injection of Haldol  100mg  that was delivered by his pharmacy. Cheerful, in a pleasant mood, states that he started taking marital arts class and is enjoying it. Denies SI/HI or AVH. Said he does hear from the holy spirit and talks to God but nothing else. Injection prepared as ordered and given in Left Deltoid without issue or complaint. Will return in 28 days.
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
## 2024-06-01 ENCOUNTER — Telehealth (HOSPITAL_COMMUNITY): Payer: Self-pay | Admitting: Psychiatry

## 2024-06-01 ENCOUNTER — Encounter (HOSPITAL_COMMUNITY): Admitting: Psychiatry

## 2024-06-01 NOTE — Telephone Encounter (Signed)
 Called patient regarding scheduled appointment today at [phone] at 2:35pm, no answer  Left HIPAA compliant voicemail with front desk # [phone]) for patient to reschedule appointment

## 2024-06-05 ENCOUNTER — Other Ambulatory Visit (HOSPITAL_BASED_OUTPATIENT_CLINIC_OR_DEPARTMENT_OTHER): Payer: Self-pay

## 2024-06-05 MED ORDER — LOSARTAN POTASSIUM-HCTZ 100-12.5 MG PO TABS
1.0000 | ORAL_TABLET | Freq: Every day | ORAL | 5 refills | Status: AC
  Filled 2024-06-07 – 2024-07-18 (×4): qty 30, 30d supply, fill #0
  Filled 2024-08-14: qty 30, 30d supply, fill #1

## 2024-06-07 ENCOUNTER — Other Ambulatory Visit (HOSPITAL_COMMUNITY): Payer: Self-pay

## 2024-06-07 ENCOUNTER — Other Ambulatory Visit: Payer: Self-pay

## 2024-06-07 MED ORDER — FAMOTIDINE 20 MG PO TABS
20.0000 mg | ORAL_TABLET | Freq: Every morning | ORAL | 2 refills | Status: AC
  Filled 2024-06-07: qty 30, 30d supply, fill #0
  Filled 2024-08-14: qty 30, 30d supply, fill #1

## 2024-06-08 ENCOUNTER — Ambulatory Visit (INDEPENDENT_AMBULATORY_CARE_PROVIDER_SITE_OTHER)

## 2024-06-08 ENCOUNTER — Other Ambulatory Visit: Payer: Self-pay

## 2024-06-08 ENCOUNTER — Other Ambulatory Visit (HOSPITAL_COMMUNITY): Payer: Self-pay

## 2024-06-08 VITALS — BP 147/80 | HR 66 | Ht 71.0 in | Wt 174.0 lb

## 2024-06-08 DIAGNOSIS — F250 Schizoaffective disorder, bipolar type: Secondary | ICD-10-CM | POA: Diagnosis not present

## 2024-06-08 MED ORDER — HALOPERIDOL DECANOATE 100 MG/ML IM SOLN
100.0000 mg | Freq: Once | INTRAMUSCULAR | Status: AC
  Administered 2024-06-08: 100 mg via INTRAMUSCULAR

## 2024-06-08 NOTE — Progress Notes (Cosign Needed)
 Pt tolerated injection of haldol  100mg /ml in RIGHT deltoid with no complaints. Pt denies all AVH, SI, and HI. Pt states that medicine is doing good for him overall.    JNL

## 2024-06-12 ENCOUNTER — Other Ambulatory Visit: Payer: Self-pay

## 2024-06-13 ENCOUNTER — Other Ambulatory Visit: Payer: Self-pay

## 2024-06-19 ENCOUNTER — Other Ambulatory Visit (HOSPITAL_COMMUNITY): Payer: Self-pay

## 2024-06-20 ENCOUNTER — Other Ambulatory Visit (HOSPITAL_COMMUNITY): Payer: Self-pay

## 2024-07-05 ENCOUNTER — Other Ambulatory Visit (HOSPITAL_BASED_OUTPATIENT_CLINIC_OR_DEPARTMENT_OTHER): Payer: Self-pay

## 2024-07-05 ENCOUNTER — Ambulatory Visit (HOSPITAL_COMMUNITY)

## 2024-07-05 ENCOUNTER — Ambulatory Visit (INDEPENDENT_AMBULATORY_CARE_PROVIDER_SITE_OTHER)

## 2024-07-05 VITALS — BP 156/90 | HR 57 | Ht 71.0 in | Wt 176.6 lb

## 2024-07-05 DIAGNOSIS — Z79899 Other long term (current) drug therapy: Secondary | ICD-10-CM | POA: Diagnosis not present

## 2024-07-05 DIAGNOSIS — F411 Generalized anxiety disorder: Secondary | ICD-10-CM

## 2024-07-05 DIAGNOSIS — F250 Schizoaffective disorder, bipolar type: Secondary | ICD-10-CM | POA: Diagnosis not present

## 2024-07-05 DIAGNOSIS — F200 Paranoid schizophrenia: Secondary | ICD-10-CM

## 2024-07-05 MED ORDER — BENZTROPINE MESYLATE 0.5 MG PO TABS
0.5000 mg | ORAL_TABLET | Freq: Every day | ORAL | 2 refills | Status: AC
  Filled 2024-07-05 – 2024-07-18 (×2): qty 30, 30d supply, fill #0
  Filled 2024-08-14: qty 30, 30d supply, fill #1

## 2024-07-05 NOTE — Progress Notes (Unsigned)
 BH MD/PA/NP OP Progress Note  07/08/2024 10:05 AM Jerry Carter  MRN:  [id number]  Chief Complaint: Injection Clinic  HPI:  Jerry Carter 63 year old African-American male presents to long-acting injectable clinic for Haldol  decanoate 100 mg/ML.  Patient was seen and evaluated face-to-face by this provider.  Jerry Carter states he has been taking medication for over a year.  Reported history related to his paranoid schizophrenia and bipolar disorder.  He denied auditory visual hallucinations however states  I hear spirits and demons.  Reports symptoms started ever since he started studying the word of God.  This provider inquired about depression or depressive symptoms.  Initially denied depressive symptoms.  Rating his depression 0 out of 10 with 10 being the worst being stated  I changed my mind. Myrna Lenis got depressed, we will struggle with depression sometimes.  Hyperreligious.  Reports plans to follow-up with work after injection.  States he is currently employed by [employer].  He reports a good appetite.  States he is resting well throughout the night.  Aims 0 denied restlessness or akathisia.   Patient is unsure which medication he is currently prescribed as he reports medication is in a pill organizer.  Pillpack by pharmacy.  Will make Cogentin  refills available. Noted to have elevated blood pressure at this visit 156/90 heart rate 57.  He denies headaches ,nausea vomiting ,dizziness or chest pain.  Chart review notably baseline blood pressure from previous visits.  States he is compliant with hypertension medication.  Reports taking medication earlier today.  Visit Diagnosis:    ICD-10-CM   1. Long-term use of high-risk medication  Z79.899 benztropine  (COGENTIN ) 0.5 MG tablet    2. Schizoaffective disorder, bipolar type (HCC)  F25.0 benztropine  (COGENTIN ) 0.5 MG tablet      Past Psychiatric History: Schizoaffective disorder bipolar type, schizophrenia, generalized anxiety disorder major  depressive disorder  Past Medical History:  Past Medical History:  Diagnosis Date   Anxiety    Bipolar disorder (HCC)    HLD (hyperlipidemia)    HTN (hypertension)    Hypertension    Insomnia    Schizophrenia (HCC)    TIA (transient ischemic attack)     Past Surgical History:  Procedure Laterality Date   NO PAST SURGERIES      Family Psychiatric History:   Family History:  Family History  Problem Relation Age of Onset   Colon cancer Father        ?    Social History:  Social History   Socioeconomic History   Marital status: Single    Spouse name: Not on file   Number of children: 0   Years of education: Not on file   Highest education level: Not on file  Occupational History   Not on file  Tobacco Use   Smoking status: Former    Types: Cigarettes   Smokeless tobacco: Never   Tobacco comments:    Quit smoking over 40 years ago -02/06/2022  Vaping Use   Vaping status: Never Used  Substance and Sexual Activity   Alcohol use: No    Alcohol/week: 0.0 standard drinks of alcohol   Drug use: No   Sexual activity: Not Currently  Other Topics Concern   Not on file  Social History Narrative   Not on file   Social Drivers of Health   Tobacco Use: Medium Risk (05/11/2024)   Patient History    Smoking Tobacco Use: Former    Smokeless Tobacco Use: Never    Passive Exposure: Not on  file  Financial Resource Strain: Not on file  Food Insecurity: Low Risk (03/31/2023)   Received from [facility]   Food Insecurity    Within the past 12 months, did the food you bought just not last and you didn't have money to get more?: No    Within the past 12 months, did you worry that your food would run out before you got money to buy more?: No  Transportation Needs: Low Risk (03/31/2023)   Received from [facility]   Transportation Needs    Within the past 12 months, has a lack of transportation kept you from medical appointments or from doing  things needed for daily living?: No  Physical Activity: Not on file  Stress: Not on file  Social Connections: Not on file  Depression (PHQ2-9): Low Risk (01/28/2023)   Depression (PHQ2-9)    PHQ-2 Score: 0  Alcohol Screen: Not on file  Housing: Medium Risk (12/02/2022)   Housing    Last Housing Risk Score: 1  Utilities: Patient Unable To Answer (03/31/2023)   Received from [facility]   Utilities    Within the past 12 months, have you been unable to get utilities (heat, electricity) when it was really needed?: Patient unable to answer  Health Literacy: Not on file    Allergies: Allergies[1]  Metabolic Disorder Labs: Lab Results  Component Value Date   HGBA1C 5.9 (H) 04/13/2024   MPG 126 12/21/2022   MPG 119.76 12/02/2022   Lab Results  Component Value Date   PROLACTIN 20.9 (H) 04/30/2022   Lab Results  Component Value Date   CHOL 131 04/13/2024   TRIG 193 04/13/2024   HDL 23 (L) 04/13/2024   CHOLHDL 2.4 02/19/2023   VLDL 6 12/21/2022   LDLCALC 75 04/13/2024   LDLCALC 47 02/19/2023   Lab Results  Component Value Date   TSH 1.69 02/19/2023   TSH 1.893 12/21/2022    Therapeutic Level Labs: Lab Results  Component Value Date   LITHIUM  0.53 (L) 03/03/2014   Lab Results  Component Value Date   VALPROATE 113.4 (H) 02/22/2014   No results found for: CBMZ  Current Medications: Current Outpatient Medications  Medication Sig Dispense Refill   aspirin  EC (ASPIRIN  LOW DOSE) 81 MG tablet Take 1 tablet (81 mg total) by mouth daily. 30 tablet 5   benztropine  (COGENTIN ) 0.5 MG tablet Take 1 tablet (0.5 mg total) by mouth daily. 30 tablet 2   clopidogrel  (PLAVIX ) 75 MG tablet Take 1 tablet (75 mg total) by mouth daily. 30 tablet 1   famotidine  (PEPCID ) 20 MG tablet Take 1 tablet (20 mg total) by mouth every morning. 30 tablet 2   haloperidol  decanoate (HALDOL  DECANOATE) 100 MG/ML injection Inject 1 mL (100 mg total) into the muscle every 28 (twenty-eight)  days. Please send to [facility] 2nd floor: [address] 2nd floor [place] 1 mL 11   hydrochlorothiazide  (MICROZIDE ) 12.5 MG capsule Take 1 capsule (12.5 mg total) by mouth daily. 90 capsule 2   ibuprofen  (ADVIL ) 800 MG tablet Take 1 tablet (800 mg total) by mouth every 8 (eight) hours as needed for moderate pain (pain score 4-6). 15 tablet 0   lidocaine  (LIDODERM ) 5 % Place 1 patch onto the skin daily. Remove & Discard patch within 12 hours or as directed by MD 30 patch 0   losartan  (COZAAR ) 50 MG tablet Take 1 tablet (50 mg total) by mouth daily. 30  tablet 5   losartan -hydrochlorothiazide  (HYZAAR ) 100-12.5 MG tablet Take 1 tablet by mouth daily. 30 tablet 5   metoprolol  succinate (TOPROL -XL) 50 MG 24 hr tablet Take 1 tablet (50 mg total) by mouth daily. 30 tablet 5   No current facility-administered medications for this visit.     Musculoskeletal: Strength & Muscle Tone: within normal limits Gait & Station: normal Patient leans: N/A  Psychiatric Specialty Exam: Review of Systems  There were no vitals taken for this visit.There is no height or weight on file to calculate BMI.  General Appearance: Casual  Eye Contact:  Good  Speech:  Clear and Coherent  Volume:  Normal  Mood:  Euthymic  Affect:  Congruent  Thought Process:  Coherent  Orientation:  Full (Time, Place, and Person)  Thought Content: WDL and hyper religious   Suicidal Thoughts:  No  Homicidal Thoughts:  No  Memory:  Immediate;   Fair Recent;   Fair  Judgement:  Fair  Insight:  Fair  Psychomotor Activity:  Normal  Concentration:  Concentration: Fair  Recall:  Good  Fund of Knowledge: Good  Language: Fair  Akathisia:  No  Handed:  Right  AIMS (if indicated): done  Assets:  Communication Skills Desire for Improvement  ADL's:  Intact  Cognition: WNL  Sleep:  Fair   Screenings: AIMS    Flowsheet Row ED from 03/17/2023 in [facility] at [facility]  Office Visit from 07/23/2022 in [facility] Office Visit from 02/06/2022 in [facility]  AIMS Total Score 0 0 6   AUDIT    Flowsheet Row [facility]  Alcohol Use Disorder Identification Test Final Score (AUDIT) 0   GAD-7    Flowsheet Row Office Visit from 04/30/2022 in [facility]  Total GAD-7 Score 0   PHQ2-9    Flowsheet Row Office Visit from 01/28/2023 in [facility] Office Visit from 04/30/2022 in [facility] [address] Office Visit from 12/09/2017 in [facility] [place] Office Visit from 04/16/2017 in [facility] - [facility] Dept Of [place] [facility] Office Visit from 03/04/2017 in [facility] - [facility] Of [place]. [facility]  PHQ-2 Total Score 0 0 0 0 0   Flowsheet Row UC from 12/22/2023 in [facility] UC from 12/19/2023 in [facility] at [facility] ED from 03/17/2023 in [facility] Emergency Department at [facility]  C-SSRS RISK CATEGORY No Risk No Risk No Risk     Assessment and Plan:  Follow-up long-acting injectable clinic every 28 days - Keep follow-up with primary psychiatric provider  Collaboration of Care: Collaboration of Care: Other Resident, Graham MD  Patient/Guardian was advised Release of Information must be obtained prior to any record release in order to collaborate their care with an outside provider. Patient/Guardian was advised if they have not already done so to contact the registration department to sign all necessary forms in order for us  to release information regarding their care.   Consent: Patient/Guardian gives verbal consent for treatment and assignment of benefits for services provided during this visit.  Patient/Guardian expressed understanding and agreed to proceed.    Staci LOISE Kerns, NP 07/05/2024, 10:05 AM      [1] No Known Allergies

## 2024-07-05 NOTE — Progress Notes (Signed)
 Pt tolerated injection of haldol  100mg /ml in Left deltoid with no complaints.  Pt denies all AVH, SI, and HI. Pt states that medicine is doing good for him overall.

## 2024-07-06 ENCOUNTER — Ambulatory Visit (HOSPITAL_COMMUNITY)

## 2024-07-08 ENCOUNTER — Encounter (HOSPITAL_COMMUNITY): Payer: Self-pay | Admitting: Family

## 2024-07-09 ENCOUNTER — Other Ambulatory Visit (HOSPITAL_BASED_OUTPATIENT_CLINIC_OR_DEPARTMENT_OTHER): Payer: Self-pay

## 2024-07-18 ENCOUNTER — Other Ambulatory Visit (HOSPITAL_BASED_OUTPATIENT_CLINIC_OR_DEPARTMENT_OTHER): Payer: Self-pay

## 2024-07-18 ENCOUNTER — Other Ambulatory Visit (HOSPITAL_COMMUNITY): Payer: Self-pay

## 2024-08-03 ENCOUNTER — Encounter (HOSPITAL_COMMUNITY): Payer: Self-pay

## 2024-08-03 ENCOUNTER — Ambulatory Visit (INDEPENDENT_AMBULATORY_CARE_PROVIDER_SITE_OTHER): Admitting: *Deleted

## 2024-08-03 VITALS — BP 157/82 | HR 63 | Ht 71.0 in | Wt 175.4 lb

## 2024-08-03 DIAGNOSIS — F250 Schizoaffective disorder, bipolar type: Secondary | ICD-10-CM | POA: Diagnosis not present

## 2024-08-03 MED ORDER — HALOPERIDOL DECANOATE 100 MG/ML IM SOLN
100.0000 mg | Freq: Once | INTRAMUSCULAR | Status: AC
  Administered 2024-08-03: 100 mg via INTRAMUSCULAR

## 2024-08-03 NOTE — Progress Notes (Signed)
 In a bit early today for his monthly inj of Haldol  D 100 mg. Today he got his shot in his R DELTOID without difficulty. He reported on arrival he got a car, a Honda but he isnt driving it yet because he hasn't completed the necessary paperwork. 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 He is living at a boarding house. 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 States he is able to stay on track with writing down his plans. He offers no complaints, states he is doing well and scheduled back in 28 days.

## 2024-08-14 ENCOUNTER — Other Ambulatory Visit (HOSPITAL_COMMUNITY): Payer: Self-pay

## 2024-08-14 ENCOUNTER — Other Ambulatory Visit: Payer: Self-pay

## 2024-08-15 ENCOUNTER — Encounter (HOSPITAL_COMMUNITY): Admitting: Psychiatry

## 2024-08-15 ENCOUNTER — Other Ambulatory Visit (HOSPITAL_COMMUNITY): Payer: Self-pay

## 2024-08-16 ENCOUNTER — Ambulatory Visit (HOSPITAL_COMMUNITY): Payer: Self-pay

## 2024-08-16 ENCOUNTER — Ambulatory Visit (HOSPITAL_COMMUNITY)
Admission: EM | Admit: 2024-08-16 | Discharge: 2024-08-16 | Disposition: A | Discharge status: Home / Self Care | Payer: Self-pay

## 2024-08-16 ENCOUNTER — Encounter (HOSPITAL_COMMUNITY): Payer: Self-pay

## 2024-08-16 ENCOUNTER — Other Ambulatory Visit (HOSPITAL_BASED_OUTPATIENT_CLINIC_OR_DEPARTMENT_OTHER): Payer: Self-pay

## 2024-08-16 DIAGNOSIS — S82831D Other fracture of upper and lower end of right fibula, subsequent encounter for closed fracture with routine healing: Secondary | ICD-10-CM

## 2024-08-16 MED ORDER — DICLOFENAC SODIUM 50 MG PO TBEC
50.0000 mg | DELAYED_RELEASE_TABLET | Freq: Two times a day (BID) | ORAL | 1 refills | Status: AC
  Filled 2024-08-16: qty 30, 15d supply, fill #0

## 2024-08-16 MED ORDER — ASPIRIN 81 MG PO TBEC: 81.0000 mg | DELAYED_RELEASE_TABLET | Freq: Every day | ORAL | 2 refills | Status: AC
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
## 2024-08-16 NOTE — Discharge Instructions (Addendum)
" °  1. Closed fracture of distal end of right fibula with routine healing, unspecified fracture morphology, subsequent encounter (Primary) - DG Ankle Complete Right x-ray performed today in UC shows right lateral malleolus/distal fibula fracture. - Apply splint short leg to right lower leg for protection and immobilization until follow-up with orthopedics - diclofenac  (VOLTAREN ) 50 MG EC tablet; Take 1 tablet (50 mg total) by mouth 2 (two) times daily.  Dispense: 30 tablet; Refill: 1 - AMB referral to orthopedics for further evaluation and treatment of right distal fibular fracture - Crutches given to patient for ambulation aid.  -Continue to monitor symptoms for any change in severity if there is any escalation of current symptoms or development of new symptoms follow-up in ER for further evaluation and management. "
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
## 2024-08-16 NOTE — ED Notes (Signed)
Ortho tech has been called 
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
## 2024-08-21 ENCOUNTER — Other Ambulatory Visit (HOSPITAL_COMMUNITY): Payer: Self-pay

## 2024-08-22 ENCOUNTER — Ambulatory Visit (HOSPITAL_COMMUNITY)
Admission: EM | Admit: 2024-08-22 | Discharge: 2024-08-22 | Disposition: A | Discharge status: Home / Self Care | Payer: Self-pay | Source: Home / Self Care

## 2024-08-22 NOTE — ED Notes (Signed)
 Patient presents to the office for another set of crutches. Patient does not have stable housing and lost one of his crutches.

## 2024-08-24 ENCOUNTER — Ambulatory Visit (INDEPENDENT_AMBULATORY_CARE_PROVIDER_SITE_OTHER): Admitting: Psychiatry

## 2024-08-24 DIAGNOSIS — F250 Schizoaffective disorder, bipolar type: Secondary | ICD-10-CM | POA: Diagnosis not present

## 2024-08-24 DIAGNOSIS — Z79899 Other long term (current) drug therapy: Secondary | ICD-10-CM | POA: Diagnosis not present

## 2024-08-28 ENCOUNTER — Ambulatory Visit: Payer: Self-pay | Admitting: Orthopedic Surgery

## 2024-08-31 ENCOUNTER — Ambulatory Visit (HOSPITAL_COMMUNITY)

## 2024-10-26 ENCOUNTER — Encounter (HOSPITAL_COMMUNITY): Admitting: Psychiatry
